# Patient Record
Sex: Male | Born: 1935
Health system: Southern US, Community
[De-identification: ages and names within clinical notes are randomized; demographics above are authoritative.]

## PROBLEM LIST (undated history)

## (undated) DIAGNOSIS — I10 Essential (primary) hypertension: Secondary | ICD-10-CM

## (undated) DIAGNOSIS — E785 Hyperlipidemia, unspecified: Secondary | ICD-10-CM

## (undated) DIAGNOSIS — G459 Transient cerebral ischemic attack, unspecified: Secondary | ICD-10-CM

## (undated) DIAGNOSIS — N400 Enlarged prostate without lower urinary tract symptoms: Secondary | ICD-10-CM

## (undated) DIAGNOSIS — J986 Disorders of diaphragm: Secondary | ICD-10-CM

## (undated) DIAGNOSIS — G4730 Sleep apnea, unspecified: Secondary | ICD-10-CM

## (undated) DIAGNOSIS — I4891 Unspecified atrial fibrillation: Secondary | ICD-10-CM

## (undated) DIAGNOSIS — K5909 Other constipation: Secondary | ICD-10-CM

## (undated) DIAGNOSIS — K635 Polyp of colon: Secondary | ICD-10-CM

## (undated) DIAGNOSIS — Z9989 Dependence on other enabling machines and devices: Secondary | ICD-10-CM

## (undated) DIAGNOSIS — M1990 Unspecified osteoarthritis, unspecified site: Secondary | ICD-10-CM

## (undated) DIAGNOSIS — K5792 Diverticulitis of intestine, part unspecified, without perforation or abscess without bleeding: Secondary | ICD-10-CM

## (undated) DIAGNOSIS — G4733 Obstructive sleep apnea (adult) (pediatric): Secondary | ICD-10-CM

## (undated) DIAGNOSIS — R0602 Shortness of breath: Secondary | ICD-10-CM

## (undated) DIAGNOSIS — R011 Cardiac murmur, unspecified: Secondary | ICD-10-CM

## (undated) DIAGNOSIS — E7800 Pure hypercholesterolemia, unspecified: Secondary | ICD-10-CM

## (undated) DIAGNOSIS — K219 Gastro-esophageal reflux disease without esophagitis: Secondary | ICD-10-CM

## (undated) HISTORY — DX: Shortness of breath: R06.02

## (undated) HISTORY — DX: Sleep apnea, unspecified: G47.30

## (undated) HISTORY — DX: Obstructive sleep apnea (adult) (pediatric): G47.33

## (undated) HISTORY — DX: Gastro-esophageal reflux disease without esophagitis: K21.9

## (undated) HISTORY — PX: HERNIA REPAIR: SHX51

## (undated) HISTORY — DX: Hyperlipidemia, unspecified: E78.5

## (undated) HISTORY — DX: Pure hypercholesterolemia, unspecified: E78.00

## (undated) HISTORY — DX: Polyp of colon: K63.5

## (undated) HISTORY — PX: ROTATOR CUFF REPAIR: SHX139

## (undated) HISTORY — PX: KNEE SURGERY: SHX244

## (undated) HISTORY — DX: Diverticulitis of intestine, part unspecified, without perforation or abscess without bleeding: K57.92

## (undated) HISTORY — PX: CHOLECYSTECTOMY: SHX55

## (undated) HISTORY — PX: AV FISTULA REPAIR: SHX563

## (undated) HISTORY — DX: Essential (primary) hypertension: I10

## (undated) HISTORY — DX: Cardiac murmur, unspecified: R01.1

## (undated) HISTORY — DX: Dependence on other enabling machines and devices: Z99.89

## (undated) HISTORY — DX: Unspecified atrial fibrillation: I48.91

## (undated) HISTORY — DX: Unspecified osteoarthritis, unspecified site: M19.90

## (undated) HISTORY — DX: Disorders of diaphragm: J98.6

## (undated) HISTORY — DX: Other constipation: K59.09

## (undated) HISTORY — PX: HEMORRHOID SURGERY: SHX153

## (undated) HISTORY — DX: Transient cerebral ischemic attack, unspecified: G45.9

## (undated) HISTORY — DX: Benign prostatic hyperplasia without lower urinary tract symptoms: N40.0

---

## 1998-06-12 ENCOUNTER — Other Ambulatory Visit: Admission: RE | Admit: 1998-06-12 | Discharge: 1998-06-12 | Payer: Self-pay | Admitting: Orthopedic Surgery

## 2002-04-13 ENCOUNTER — Ambulatory Visit (HOSPITAL_COMMUNITY): Admission: RE | Admit: 2002-04-13 | Discharge: 2002-04-13 | Payer: Self-pay | Admitting: Family Medicine

## 2002-04-13 ENCOUNTER — Encounter: Payer: Self-pay | Admitting: Family Medicine

## 2003-04-18 ENCOUNTER — Encounter: Payer: Self-pay | Admitting: Emergency Medicine

## 2003-04-18 ENCOUNTER — Emergency Department (HOSPITAL_COMMUNITY): Admission: EM | Admit: 2003-04-18 | Discharge: 2003-04-19 | Payer: Self-pay | Admitting: Emergency Medicine

## 2003-05-04 ENCOUNTER — Ambulatory Visit (HOSPITAL_COMMUNITY): Admission: RE | Admit: 2003-05-04 | Discharge: 2003-05-04 | Payer: Self-pay | Admitting: Specialist

## 2003-05-04 ENCOUNTER — Encounter: Payer: Self-pay | Admitting: Specialist

## 2003-11-21 ENCOUNTER — Ambulatory Visit (HOSPITAL_COMMUNITY): Admission: RE | Admit: 2003-11-21 | Discharge: 2003-11-21 | Payer: Self-pay | Admitting: Orthopedic Surgery

## 2003-12-02 ENCOUNTER — Encounter: Admission: RE | Admit: 2003-12-02 | Discharge: 2004-01-02 | Payer: Self-pay | Admitting: Orthopedic Surgery

## 2004-07-13 ENCOUNTER — Ambulatory Visit (HOSPITAL_COMMUNITY): Admission: RE | Admit: 2004-07-13 | Discharge: 2004-07-13 | Payer: Self-pay | Admitting: Family Medicine

## 2004-10-19 ENCOUNTER — Ambulatory Visit (HOSPITAL_COMMUNITY): Admission: RE | Admit: 2004-10-19 | Discharge: 2004-10-19 | Payer: Self-pay | Admitting: Orthopedic Surgery

## 2004-12-04 ENCOUNTER — Ambulatory Visit (HOSPITAL_COMMUNITY): Admission: RE | Admit: 2004-12-04 | Discharge: 2004-12-04 | Payer: Self-pay | Admitting: Orthopedic Surgery

## 2004-12-04 ENCOUNTER — Ambulatory Visit (HOSPITAL_BASED_OUTPATIENT_CLINIC_OR_DEPARTMENT_OTHER): Admission: RE | Admit: 2004-12-04 | Discharge: 2004-12-04 | Payer: Self-pay | Admitting: Orthopedic Surgery

## 2005-03-05 ENCOUNTER — Encounter: Admission: RE | Admit: 2005-03-05 | Discharge: 2005-06-03 | Payer: Self-pay | Admitting: Orthopedic Surgery

## 2005-06-02 ENCOUNTER — Emergency Department (HOSPITAL_COMMUNITY): Admission: EM | Admit: 2005-06-02 | Discharge: 2005-06-02 | Payer: Self-pay | Admitting: Family Medicine

## 2005-09-07 ENCOUNTER — Emergency Department (HOSPITAL_COMMUNITY): Admission: EM | Admit: 2005-09-07 | Discharge: 2005-09-07 | Payer: Self-pay | Admitting: *Deleted

## 2005-11-04 ENCOUNTER — Ambulatory Visit: Payer: Self-pay | Admitting: Internal Medicine

## 2005-11-13 ENCOUNTER — Ambulatory Visit: Payer: Self-pay | Admitting: Internal Medicine

## 2005-12-04 ENCOUNTER — Ambulatory Visit: Payer: Self-pay | Admitting: Internal Medicine

## 2005-12-04 ENCOUNTER — Encounter: Payer: Self-pay | Admitting: Internal Medicine

## 2005-12-04 ENCOUNTER — Ambulatory Visit (HOSPITAL_COMMUNITY): Admission: RE | Admit: 2005-12-04 | Discharge: 2005-12-04 | Payer: Self-pay | Admitting: Internal Medicine

## 2005-12-16 DIAGNOSIS — K635 Polyp of colon: Secondary | ICD-10-CM

## 2005-12-16 HISTORY — DX: Polyp of colon: K63.5

## 2006-01-08 ENCOUNTER — Emergency Department (HOSPITAL_COMMUNITY): Admission: EM | Admit: 2006-01-08 | Discharge: 2006-01-09 | Payer: Self-pay | Admitting: Family Medicine

## 2006-01-09 ENCOUNTER — Ambulatory Visit: Payer: Self-pay | Admitting: Internal Medicine

## 2006-01-27 ENCOUNTER — Ambulatory Visit: Payer: Self-pay | Admitting: Internal Medicine

## 2006-03-06 ENCOUNTER — Ambulatory Visit: Payer: Self-pay | Admitting: Internal Medicine

## 2006-03-28 ENCOUNTER — Ambulatory Visit (HOSPITAL_COMMUNITY): Admission: RE | Admit: 2006-03-28 | Discharge: 2006-03-28 | Payer: Self-pay | Admitting: *Deleted

## 2006-06-13 ENCOUNTER — Encounter: Admission: RE | Admit: 2006-06-13 | Discharge: 2006-06-13 | Payer: Self-pay | Admitting: Neurosurgery

## 2006-06-30 ENCOUNTER — Encounter: Admission: RE | Admit: 2006-06-30 | Discharge: 2006-06-30 | Payer: Self-pay | Admitting: Neurosurgery

## 2007-01-06 ENCOUNTER — Ambulatory Visit (HOSPITAL_COMMUNITY): Admission: RE | Admit: 2007-01-06 | Discharge: 2007-01-06 | Payer: Self-pay | Admitting: Ophthalmology

## 2007-01-22 ENCOUNTER — Ambulatory Visit (HOSPITAL_COMMUNITY): Admission: RE | Admit: 2007-01-22 | Discharge: 2007-01-22 | Payer: Self-pay | Admitting: Ophthalmology

## 2007-02-04 ENCOUNTER — Ambulatory Visit (HOSPITAL_COMMUNITY): Admission: RE | Admit: 2007-02-04 | Discharge: 2007-02-04 | Payer: Self-pay | Admitting: *Deleted

## 2007-02-06 HISTORY — PX: CARDIAC CATHETERIZATION: SHX172

## 2007-03-18 ENCOUNTER — Ambulatory Visit: Payer: Self-pay | Admitting: Internal Medicine

## 2007-07-17 ENCOUNTER — Ambulatory Visit (HOSPITAL_COMMUNITY): Admission: RE | Admit: 2007-07-17 | Discharge: 2007-07-17 | Payer: Self-pay | Admitting: Family Medicine

## 2007-11-09 DIAGNOSIS — G4733 Obstructive sleep apnea (adult) (pediatric): Secondary | ICD-10-CM

## 2007-11-09 HISTORY — DX: Obstructive sleep apnea (adult) (pediatric): G47.33

## 2008-07-02 ENCOUNTER — Encounter: Admission: RE | Admit: 2008-07-02 | Discharge: 2008-07-02 | Payer: Self-pay | Admitting: Neurosurgery

## 2008-11-01 ENCOUNTER — Ambulatory Visit: Payer: Self-pay | Admitting: Internal Medicine

## 2009-10-20 ENCOUNTER — Ambulatory Visit (HOSPITAL_COMMUNITY): Admission: RE | Admit: 2009-10-20 | Discharge: 2009-10-20 | Payer: Self-pay | Admitting: Family Medicine

## 2010-03-13 ENCOUNTER — Ambulatory Visit: Payer: Self-pay | Admitting: Internal Medicine

## 2010-03-13 DIAGNOSIS — R1319 Other dysphagia: Secondary | ICD-10-CM | POA: Insufficient documentation

## 2010-03-13 DIAGNOSIS — Z860100 Personal history of colon polyps, unspecified: Secondary | ICD-10-CM | POA: Insufficient documentation

## 2010-03-14 ENCOUNTER — Ambulatory Visit (HOSPITAL_COMMUNITY): Admission: RE | Admit: 2010-03-14 | Discharge: 2010-03-14 | Payer: Self-pay | Admitting: Internal Medicine

## 2010-03-16 ENCOUNTER — Encounter: Payer: Self-pay | Admitting: Internal Medicine

## 2010-03-21 ENCOUNTER — Encounter (INDEPENDENT_AMBULATORY_CARE_PROVIDER_SITE_OTHER): Payer: Self-pay

## 2010-04-17 ENCOUNTER — Ambulatory Visit (HOSPITAL_COMMUNITY): Admission: RE | Admit: 2010-04-17 | Discharge: 2010-04-17 | Payer: Self-pay | Admitting: Internal Medicine

## 2010-05-18 ENCOUNTER — Emergency Department (HOSPITAL_COMMUNITY): Admission: EM | Admit: 2010-05-18 | Discharge: 2010-05-18 | Payer: Self-pay | Admitting: Emergency Medicine

## 2010-05-22 ENCOUNTER — Inpatient Hospital Stay (HOSPITAL_COMMUNITY)
Admission: EM | Admit: 2010-05-22 | Discharge: 2010-05-23 | Payer: Self-pay | Source: Home / Self Care | Admitting: Emergency Medicine

## 2010-05-30 DIAGNOSIS — R0602 Shortness of breath: Secondary | ICD-10-CM

## 2010-05-30 HISTORY — DX: Shortness of breath: R06.02

## 2010-06-06 DIAGNOSIS — I4891 Unspecified atrial fibrillation: Secondary | ICD-10-CM

## 2010-06-06 HISTORY — DX: Unspecified atrial fibrillation: I48.91

## 2010-11-26 ENCOUNTER — Encounter (INDEPENDENT_AMBULATORY_CARE_PROVIDER_SITE_OTHER): Payer: Self-pay | Admitting: *Deleted

## 2010-12-18 ENCOUNTER — Ambulatory Visit
Admission: RE | Admit: 2010-12-18 | Discharge: 2010-12-18 | Payer: Self-pay | Source: Home / Self Care | Attending: Gastroenterology | Admitting: Gastroenterology

## 2010-12-18 ENCOUNTER — Encounter: Payer: Self-pay | Admitting: Internal Medicine

## 2010-12-26 ENCOUNTER — Telehealth (INDEPENDENT_AMBULATORY_CARE_PROVIDER_SITE_OTHER): Payer: Self-pay

## 2010-12-27 ENCOUNTER — Encounter: Payer: Self-pay | Admitting: Internal Medicine

## 2011-01-04 ENCOUNTER — Ambulatory Visit (HOSPITAL_COMMUNITY)
Admission: RE | Admit: 2011-01-04 | Discharge: 2011-01-04 | Payer: Self-pay | Source: Home / Self Care | Attending: Internal Medicine | Admitting: Internal Medicine

## 2011-01-06 ENCOUNTER — Encounter: Payer: Self-pay | Admitting: Family Medicine

## 2011-01-06 ENCOUNTER — Encounter: Payer: Self-pay | Admitting: Neurosurgery

## 2011-01-06 ENCOUNTER — Encounter: Payer: Self-pay | Admitting: Internal Medicine

## 2011-01-11 ENCOUNTER — Encounter: Payer: Self-pay | Admitting: Internal Medicine

## 2011-01-15 NOTE — Op Note (Signed)
  NAME:  Joe Lee, Joe Lee              ACCOUNT NO.:  [id number]  MEDICAL RECORD NO.:  [id number]          PATIENT TYPE:  AMB  LOCATION:  DAY                           FACILITY:  [facility]  PHYSICIAN:  R. Roetta Sessions, M.D. DATE OF BIRTH:  July 25, 1934  DATE OF PROCEDURE:  01/04/2011 DATE OF DISCHARGE:                              OPERATIVE REPORT   PROCEDURE:  Colonoscopy with snare polypectomy biopsy.  INDICATIONS FOR PROCEDURE:  A 77 year old gentleman with history colonic adenomas, last colonoscopy approximately 5-6 years ago.  No lower GI tract symptoms currently here for surveillance.  Risks, benefits, limitations, alternatives, and imponderables have been reviewed, questions answered.  Please see the documentation medical record.  PROCEDURE NOTE:  O2 saturation, blood pressure, pulse, and respirations were monitored throughout the entire procedure.  CONSCIOUS SEDATION:  Versed 4 mg IV and Demerol 75 mg IV in divided doses.  INSTRUMENT:  Pentax video chip system.  FINDINGS:  Digital rectal exam revealed no abnormalities.  Endoscopic findings:  Prep was adequate.  Colon:  Colonic the rectosigmoid junction through the left transverse right colon to the appendiceal orifice, ileocecal valve/cecum.  These structures well seen and photographed for the record.  From this level, scope was slowly and cautiously withdrawn. All previously mentioned mucosal surfaces were again seen.  The patient was noted to have 2 ascending colon polyps.  There were both hot snared, largest was 8 mm.  There was also diminutive sigmoid polyp which was cold biopsied/removed.  The patient also had pancolonic diverticula. Remaining colonic mucosa appeared unremarkable.  Scope was pulled down into the rectum.  Thorough examination of rectal mucosa including retroflex view of the anal verge demonstrated no abnormalities.  Cecal withdrawal time 12 minutes.  The patient tolerated the procedure well and was reactive  in endoscopy.  IMPRESSION: 1. Normal rectum. 2. Pancolonic diverticula. 3. Multiple colonic polyps removed as described above.  RECOMMENDATIONS: 1. Resume Coumadin tomorrow, do not resume aspirin until January 12, 2011. 2. Follow up on path. 3. Further recommendations to follow.     Jonathon Bellows, M.D.     RMR/MEDQ  D:  01/04/2011  T:  01/04/2011  Job:  [id number]  cc:   [facility]  Dr. Egbert Garibaldi  Electronically Signed by Lorrin Goodell M.D. on 01/15/2011 01:39:06 PM

## 2011-01-15 NOTE — Assessment & Plan Note (Signed)
Summary: FOOD GETS STUCK/CM   Visit Type:  Follow-up Visit Primary Care Provider:  Dr. Nobie Putnam  Chief Complaint:  food feels like its getting stuck.  History of Present Illness: "When I eat solid foods, feels like gets stuck in back of throat" x 1 1/2 months. Mostly solids like peanut butter, peanuts, greasy foods. No problems w/ liquids.  No regurgitation.  Denies heartburn & indigestion.  On coumadin for A Fib.  Denies N/V/D or constipation.  Takes miralax prn.  Denies rectal bleeding or melena.  Denies any abd pain.  Wt stable.  Appetite ok.  Current Problems (verified): 1)  Colonic Polyps, Adenomatous, Hx of  (ICD-V12.72) 2)  Other Dysphagia  (ICD-787.29)  Current Medications (verified): 1)  Multi-Vitamin 2)  Metoprolol Tartrate 50 Mg Tabs (Metoprolol Tartrate) .... One By Mouth Bid 3)  Garlic 4)  Warfarin Sodium 5 Mg Tabs (Warfarin Sodium) .... One By Mouth Daily 5)  Simvastatin 20 Mg Tabs (Simvastatin) .... One By Mouth Daily 6)  Gas X .... As Needed 7)  Fish Oil 1000 Mg Caps (Omega-3 Fatty Acids) .... Once Daily 8)  Diltiazem Hcl Cr 180 Mg Xr24h-Cap (Diltiazem Hcl) .... Once Daily 9)  Miralax  Powd (Polyethylene Glycol 3350) .Marland KitchenMarland KitchenMarland Kitchen 17 Grams By Mouth Daily  Allergies (verified): 1)  ! Penicillin  Past History:  Past Medical History: A Fib GERD Hyperlipidemia Hypertension Hx of Diverticulosis w/ diverticulitis  constipation adenomatous colon polyp 12/16/05 colonoscopy  Past Surgical History: Cholecystectomy Hemorrhoidectomy Rotator Cuff Repair Knee Sx  anorectal fistula repair  Family History: No known family history of colorectal carcinoma, IBD, liver or chronic GI problems. 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 quit 35 yrs Alcohol Use - 1/2 glass red wine daily, occ beer Illicit Drug Use  - no Patient gets regular exercise. Smoking Status:  quit Drug Use:  no Does Patient Exercise:  yes  Review of Systems General:  Denies fever, chills, sweats, anorexia, fatigue, weakness, malaise, weight loss, and sleep disorder. CV:  Denies chest pains, angina, palpitations, syncope, dyspnea on exertion, orthopnea, PND, peripheral edema, and claudication. Resp:  Denies dyspnea at rest, dyspnea with exercise, cough, sputum, wheezing, coughing up blood, and pleurisy. GI:  Denies pain on swallowing, nausea, indigestion/heartburn, vomiting, vomiting blood, abdominal pain, jaundice, gas/bloating, diarrhea, constipation, change in bowel habits, bloody BM's, black BMs, and fecal incontinence. GU:  Denies urinary burning, blood in urine, urinary frequency, urinary hesitancy, nocturnal urination, and urinary incontinence. Derm:  Denies rash, itching, dry skin, hives, moles, warts, and unhealing ulcers. Psych:  Denies depression, anxiety, memory loss, suicidal ideation, hallucinations, paranoia, phobia, and confusion. Heme:  Denies bruising, bleeding, and enlarged lymph nodes.  Vital Signs:  Patient profile:   77 year old male Height:      70.5 inches Weight:      225 pounds BMI:     31.94 Temp:     98.3 degrees F oral Pulse rate:   64 / minute BP sitting:   122 / 74  (left arm) Cuff size:   regular  Vitals Entered By: Hendricks Limes LPN (March 13, 2010 11:17 AM)  Physical Exam  General:  Well developed, well nourished, no acute distress. Head:  Normocephalic and atraumatic. Eyes:  Sclera clear, no icterus. Ears:  Normal auditory acuity. Mouth:  No deformity or lesions, dentition normal. Neck:  Supple;  no masses or thyromegaly. Lungs:  Clear throughout to auscultation. Heart:  Regular rate and rhythm; no murmurs, rubs,  or bruits. Abdomen:  Soft, nontender and nondistended. No masses, hepatosplenomegaly or hernias noted. Normal bowel sounds.without guarding and without rebound.   Msk:   Symmetrical with no gross deformities. Normal posture. Pulses:  Normal pulses noted. Extremities:  No clubbing, cyanosis, edema or deformities noted. Neurologic:  Alert and  oriented x4;  grossly normal neurologically. Skin:  Intact without significant lesions or rashes. Cervical Nodes:  No significant cervical adenopathy. Psych:  Alert and cooperative. Normal mood and affect.  Impression & Recommendations:  Problem # 1:  OTHER DYSPHAGIA (OAC-166.06) 77 y/o black male w/ 6 week hx of dysphagia.  May be oropharyngeal component as he points more to pharynx/neck than to upper esophagus.  Ba pill esophagram for further evaluation.  If esophageal web, ring, stricture is found, would pursue EGD for dilatation.  Orders: Est. Patient Level IV (30160)  Problem # 2:  COLONIC POLYPS, ADENOMATOUS, HX OF (ICD-V12.72) Assessment: Comment Only

## 2011-01-15 NOTE — Letter (Signed)
Summary: Plan of Care, Need to Discuss  [facility]  [address]   [place], [place] [address]   Phone: [phone]  Fax: [phone]    March 21, 2010  NHIA HEAPHY [address] [place], [place]  [address] 1934/01/18   Dear Joe Lee,   We are writing this letter to inform you of treatment plans and/or discuss your plan of care.  We have tried several times to contact you; however, we have yet to reach you.  We ask that you please contact our office for follow-up on your gastrointestinal issues.  We can  be reached at [phone] to schedule an appointment, or to speak with someone regarding your health care needs.  Please do not neglect your health.   Sincerely,    Cloria Spring LPN  [facility] Ph: [phone]    Fax: [phone]

## 2011-01-15 NOTE — Letter (Signed)
Summary: speech rehab order  speech rehab order   Imported By: Ave Filter 03/16/2010 11:22:21  _____________________________________________________________________  External Attachment:    Type:   Image     Comment:   External Document

## 2011-01-15 NOTE — Letter (Signed)
Summary: BPE ORDER  BPE ORDER   Imported By: Ave Filter 03/13/2010 12:27:21  _____________________________________________________________________  External Attachment:    Type:   Image     Comment:   External Document

## 2011-01-17 NOTE — Progress Notes (Signed)
----   Converted from flag ---- ---- 12/25/2010 4:25 PM, Gerrit Halls NP wrote: can we d/w Mr. Barua's cardiology his coumadin. need to know if we can hold 5 days prior. hx of afib. scheduled 1/20. thanks! ------------------------------  Appended Document:  Faxed request to [facility].  Appended Document:  Received fax back from [place]. Signed by Dr. Lynnea Ferrier, OK to hold coumadin x 5 days prior to procedure. Pt informed and told to take last coumadin on 12/29/2010. TCS scheduled for 01/04/2011.

## 2011-01-17 NOTE — Letter (Signed)
Summary: CARDIOLOGIST/RE: COUMADIN  CARDIOLOGIST/RE: COUMADIN   Imported By: Rexene Alberts 12/27/2010 11:35:05  _____________________________________________________________________  External Attachment:    Type:   Image     Comment:   External Document

## 2011-01-17 NOTE — Assessment & Plan Note (Signed)
Summary: consult for tcs/ov prior to scheduling/ss   Visit Type:  Follow-up Visit Primary Care Provider:  None  CC:  consult for tcs.  History of Present Illness: Mr. Chiang presents today for a standard office visit prior to upcoming colonoscopy. Last seen March 2011. Denies abdominal pain. Hx of constipation, relieved by miralax. No brbpr, no melena. No loss of appetite or weight loss. Denies reflux currently. Denies dysphagia/odynophagia. On Coumadin 5 mg secondary to afib. Did see Dr. Nobie Putnam as PCP who has since retired per pt.   Dec 2006: Anal stenosis, anal canal hemorrhoids, otherwise normal rectum.  2.  Pancolonic diverticula.  3.  A diminutive polyp at hepatic flexure cold biopsied/removed.  The      remainder of the colonic mucosa appeared normal. biopsy: adenoma   Current Medications (verified): 1)  Multi-Vitamin 2)  Metoprolol Tartrate 50 Mg Tabs (Metoprolol Tartrate) .... One By Mouth Bid 3)  Garlic 4)  Warfarin Sodium 5 Mg Tabs (Warfarin Sodium) .... One By Mouth Daily 5)  Simvastatin 20 Mg Tabs (Simvastatin) .... One By Mouth Daily 6)  Gas X .... As Needed 7)  Fish Oil 1000 Mg Caps (Omega-3 Fatty Acids) .... Once Daily 8)  Diltiazem Hcl Cr 180 Mg Xr24h-Cap (Diltiazem Hcl) .... Once Daily 9)  Miralax  Powd (Polyethylene Glycol 3350) .Marland KitchenMarland KitchenMarland Kitchen 17 Grams By Mouth Daily 10)  Aspirin 81 Mg Tbec (Aspirin) .... Once Daily  Allergies (verified): 1)  ! Penicillin  Past History:  Past Medical History: Last updated: 03/13/2010 A Fib GERD Hyperlipidemia Hypertension Hx of Diverticulosis w/ diverticulitis  constipation adenomatous colon polyp 12/16/05 colonoscopy  Past Surgical History: Last updated: 03/13/2010 Cholecystectomy Hemorrhoidectomy Rotator Cuff Repair Knee Sx  anorectal fistula repair  Family History: Last updated: 03/13/2010 No known family history of colorectal carcinoma, IBD, liver or chronic GI problems. 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 quit 35 yrs Alcohol Use - 1/2 glass red wine daily, occ beer Illicit Drug Use - no Patient gets regular exercise.  Review of Systems General:  Denies fever, chills, and anorexia. Eyes:  Denies blurring, irritation, and discharge. ENT:  Denies sore throat, hoarseness, and difficulty swallowing. CV:  Denies chest pains, syncope, and dyspnea on exertion. Resp:  Denies dyspnea at rest and wheezing. GI:  Denies difficulty swallowing, pain on swallowing, nausea, indigestion/heartburn, abdominal pain, constipation, change in bowel habits, bloody BM's, and black BMs. GU:  Denies urinary burning and urinary frequency. MS:  Denies joint pain / LOM, joint swelling, and joint stiffness. Derm:  Denies rash, itching, and dry skin. Neuro:  Denies weakness and syncope. Psych:  Denies depression and anxiety. Endo:  Denies cold intolerance and heat intolerance. Heme:  Denies bruising and bleeding.  Vital Signs:  Patient profile:   77 year old male Height:      70.5 inches Weight:      224 pounds BMI:     31.80 Temp:     98.3 degrees F oral Pulse rate:   64 / minute BP sitting:   128 / 100  (left arm) Cuff size:   regular  Vitals Entered By: Hendricks Limes LPN (December 18, 2010 10:48 AM)  Physical Exam  General:  Well developed, well nourished, no acute distress. Head:  Normocephalic and atraumatic. Eyes:  sclera without icterus, conjuctiva clear Mouth:  No deformity or lesions, dentition normal. Lungs:  Clear throughout to auscultation. Heart:  S1 S2 present, no murmurs appreciated Abdomen:  +BS, non-tender, non-distended, no HSM. no mass noted Msk:  Symmetrical with no gross deformities. Normal posture. Pulses:  Normal pulses noted. Extremities:  No clubbing, cyanosis, edema or deformities  noted. Neurologic:  Alert and  oriented x4;  grossly normal neurologically. Skin:  Intact without significant lesions or rashes. Psych:  Alert and cooperative. Normal mood and affect.  Impression & Recommendations:  Problem # 1:  COLONIC POLYPS, ADENOMATOUS, HX OF (ICD-V44.14)  77 year old male here for repeat colonoscopy, last done December 2006. Hx of adenoma. No abdominal pain, no change in bowel habits, denies brbpr or melena. On Coumadin for hx of afib, will need to discuss with cardiology for management prior to colonoscopy.  TCS with RMR: the risks, benefits, and alternatives have been discussed in detail with the pt; verbal consent obtained. Will d/w cardiology managment of coumadin  Orders: Est. Patient Level II ([address])

## 2011-01-17 NOTE — Letter (Signed)
Summary: TCS ORDER  TCS ORDER   Imported By: Ave Filter 12/18/2010 14:44:02  _____________________________________________________________________  External Attachment:    Type:   Image     Comment:   External Document

## 2011-01-17 NOTE — Letter (Addendum)
Summary: Patient Notice, Colon Biopsy Results  [facility]  [address]   [place], [place] [address]   Phone: [phone]  Fax: [phone]       January 11, 2011   Joe Lee [address] [place], [place]  [address] 08/13/1934    Dear Joe Lee,  I am pleased to inform you that the biopsies taken during your recent colonoscopy did not show any evidence of cancer upon pathologic examination.  Additional information/recommendations:  No further action is needed at this time.  Please follow-up with your primary care physician for your other healthcare needs.  You should have a repeat colonoscopy examination  in 5 years.  Please call us if you are having persistent problems or have questions about your condition that have not been fully answered at this time.  Sincerely,    R. Roetta Sessions MD, FACP [facility] Ph: [phone]    Fax: [phone]   Appended Document: Patient Notice, Colon Biopsy Results letter mailed to pt  Appended Document: Patient Notice, Colon Biopsy Results reminder in epic

## 2011-01-17 NOTE — Letter (Signed)
Summary: Recall Office Visit  [facility]  [address]   [place], [place] [address]   Phone: [phone]  Fax: [phone]      November 26, 2010   LINDY PENNISI [address] [place], [place]  [address] [birth year]   Dear Mr. Goldwater,   According to our records, it is time for you to schedule a follow-up office visit with [place].   At your convenience, please call [phone] to schedule an office visit. If you have any questions, concerns, or feel that this letter is in error, we would appreciate your call.   Sincerely,    Diana Eves  [facility] Ph: [phone]   Fax: [phone]

## 2011-03-04 LAB — DIFFERENTIAL
Basophils Absolute: 0 10*3/uL (ref 0.0–0.1)
Basophils Relative: 1 % (ref 0–1)
Basophils Relative: 2 % — ABNORMAL HIGH (ref 0–1)
Eosinophils Absolute: 0.1 10*3/uL (ref 0.0–0.7)
Eosinophils Absolute: 0.2 10*3/uL (ref 0.0–0.7)
Eosinophils Relative: 4 % (ref 0–5)
Lymphocytes Relative: 44 % (ref 12–46)
Lymphocytes Relative: 46 % (ref 12–46)
Lymphs Abs: 2.5 K/uL (ref 0.7–4.0)
Monocytes Absolute: 0.4 10*3/uL (ref 0.1–1.0)
Monocytes Relative: 7 % (ref 3–12)
Monocytes Relative: 8 % (ref 3–12)
Neutro Abs: 2.2 10*3/uL (ref 1.7–7.7)
Neutrophils Relative %: 42 % — ABNORMAL LOW (ref 43–77)
Neutrophils Relative %: 45 % (ref 43–77)

## 2011-03-04 LAB — CARDIAC PANEL(CRET KIN+CKTOT+MB+TROPI): Relative Index: 1.6 (ref 0.0–2.5)

## 2011-03-04 LAB — POCT CARDIAC MARKERS
CKMB, poc: 3.7 ng/mL (ref 1.0–8.0)
Myoglobin, poc: 128 ng/mL (ref 12–200)
Myoglobin, poc: 148 ng/mL (ref 12–200)
Troponin i, poc: <0.05 ng/mL (ref 0.00–0.09)

## 2011-03-04 LAB — CBC
HCT: 39.1 % (ref 39.0–52.0)
HCT: 42.8 % (ref 39.0–52.0)
Hemoglobin: 13.3 g/dL (ref 13.0–17.0)
Hemoglobin: 14.2 g/dL (ref 13.0–17.0)
MCHC: 33.2 g/dL (ref 30.0–36.0)
MCHC: 34.1 g/dL (ref 30.0–36.0)
MCV: 90.8 fL (ref 78.0–100.0)
Platelets: 152 10*3/uL (ref 150–400)
Platelets: 175 10*3/uL (ref 150–400)
RBC: 4.31 MIL/uL (ref 4.22–5.81)
RDW: 15 % (ref 11.5–15.5)
RDW: 15.3 % (ref 11.5–15.5)
WBC: 5.4 10*3/uL (ref 4.0–10.5)
WBC: 6.6 10*3/uL (ref 4.0–10.5)

## 2011-03-04 LAB — URINALYSIS, ROUTINE W REFLEX MICROSCOPIC
Nitrite: NEGATIVE
Protein, ur: NEGATIVE mg/dL
Urobilinogen, UA: 1 mg/dL (ref 0.0–1.0)

## 2011-03-04 LAB — LIPID PANEL
Cholesterol: 131 mg/dL (ref 0–200)
LDL Cholesterol: 63 mg/dL (ref 0–99)
Total CHOL/HDL Ratio: 2.6 RATIO
Triglycerides: 83 mg/dL (ref <150)
VLDL: 17 mg/dL (ref 0–40)

## 2011-03-04 LAB — PROTIME-INR
INR: 1.25 (ref 0.00–1.49)
INR: 2.77 — ABNORMAL HIGH (ref 0.00–1.49)
Prothrombin Time: 29 seconds — ABNORMAL HIGH (ref 11.6–15.2)

## 2011-03-04 LAB — POCT I-STAT, CHEM 8
BUN: 22 mg/dL (ref 6–23)
Calcium, Ion: 1.19 mmol/L (ref 1.12–1.32)
Chloride: 108 mEq/L (ref 96–112)
Creatinine, Ser: 1.2 mg/dL (ref 0.4–1.5)
Glucose, Bld: 116 mg/dL — ABNORMAL HIGH (ref 70–99)
HCT: 41 % (ref 39.0–52.0)
Hemoglobin: 13.9 g/dL (ref 13.0–17.0)
Potassium: 3.8 meq/L (ref 3.5–5.1)
Sodium: 142 mEq/L (ref 135–145)
TCO2: 27 mmol/L (ref 0–100)

## 2011-03-04 LAB — TROPONIN I
Troponin I: 0.01 ng/mL (ref 0.00–0.06)
Troponin I: 0.02 ng/mL (ref 0.00–0.06)

## 2011-03-04 LAB — RAPID URINE DRUG SCREEN, HOSP PERFORMED: Barbiturates: NOT DETECTED

## 2011-03-04 LAB — BASIC METABOLIC PANEL
Chloride: 107 mEq/L (ref 96–112)
Creatinine, Ser: 1.06 mg/dL (ref 0.4–1.5)
Potassium: 4.2 mEq/L (ref 3.5–5.1)

## 2011-03-04 LAB — CK TOTAL AND CKMB (NOT AT ARMC)
CK, MB: 3.9 ng/mL (ref 0.3–4.0)
Relative Index: 1.6 (ref 0.0–2.5)
Total CK: 264 U/L — ABNORMAL HIGH (ref 7–232)

## 2011-03-04 LAB — APTT: aPTT: 34 seconds (ref 24–37)

## 2011-03-04 LAB — HEMOGLOBIN A1C: Mean Plasma Glucose: 134 mg/dL — ABNORMAL HIGH (ref <117)

## 2011-04-13 ENCOUNTER — Inpatient Hospital Stay (INDEPENDENT_AMBULATORY_CARE_PROVIDER_SITE_OTHER)
Admission: RE | Admit: 2011-04-13 | Discharge: 2011-04-13 | Disposition: A | Discharge status: Home / Self Care | Payer: 59 | Source: Ambulatory Visit | Attending: Emergency Medicine | Admitting: Emergency Medicine

## 2011-04-13 DIAGNOSIS — T6391XA Toxic effect of contact with unspecified venomous animal, accidental (unintentional), initial encounter: Secondary | ICD-10-CM

## 2011-04-30 NOTE — Assessment & Plan Note (Signed)
NAME:  Joe Lee, Joe Lee               CHART#:  [id number]   DATE:  11/01/2008                       DOB:  03-23-1934   FOLLOWUP:  History of gastroesophageal reflux disease and history of  colonic adenoma removed in 2006.   The patient is here for followup.  I tentatively plan to bring him back  in 3 years for a colonoscopy and a single 3-mm adenoma in his colon and  hepatic flexure removed in 2006.  He has done well.  He does not have  any lower GI-like symptoms aside from chronic constipation well managed  with GlycoLax. Gastroesophageal reflux disease, symptoms are well-  controlled with Zegerid, which he really only takes on a p.r.n. basis  these days.  Overall, he has any significant intercurrent health  problems and is doing very well in general.   CURRENT MEDICATIONS:  See updated list.   ALLERGIES:  Penicillin.   PHYSICAL EXAMINATION:  GENERAL:  Today, pleasant 77 year old gentleman  resting comfortably.  VITAL SIGNS:  Weight 224.5 that is down 5-1/2 pounds, height 5 feet 9  inches, temperature 98.2, BP 140/80, and pulse 64.  SKIN:  Warm and dry.  No scleral icterus.  CHEST:  Lungs are clear to auscultation.  CARDIAC:  Regular rate and rhythm without murmur, gallop, or rub.  ABDOMEN:  Nondistended, positive bowel sounds, soft, nontender without  appreciable mass or organomegaly.   ASSESSMENT:  1. Gastroesophageal reflux disease symptoms, well controlled on      Zegerid, which he uses more or less on a p.r.n. basis.  2. History of a single small colonic adenoma in the hepatic flexure in      2006.  At this point, the patient can really wait 5 years until      between colonoscopies, and we talked about this at some length with      current guidelines which are recommended.  He will come back in 2      years for a surveillance colonoscopy.  If he has any interim      problems, however, I will be glad to see him back at anytime.  3. Chronic constipation.  He will continue  GlycoLax.  We will fax a      prescription over to [facility].       Jonathon Bellows, M.D.  Electronically Signed     RMR/MEDQ  D:  11/01/2008  T:  11/01/2008  Job:  [id number]   cc:   Patrica Duel, M.D.

## 2011-05-03 NOTE — Op Note (Signed)
NAME:  KEION, NEELS              ACCOUNT NO.:  [id number]   MEDICAL RECORD NO.:  [id number]          PATIENT TYPE:  AMB   LOCATION:  DAY                           FACILITY:  [facility]   PHYSICIAN:  R. Roetta Sessions, M.D. DATE OF BIRTH:  09/26/1934   DATE OF PROCEDURE:  12/04/2005  DATE OF DISCHARGE:                                 OPERATIVE REPORT   PROCEDURE:  Colonoscopy.   ENDOSCOPIST:  Gerrit Friends. Rourk, M.D.   INDICATIONS FOR PROCEDURE:  The patient is a 77 year old Caucasian male who  has had a recent bout of diverticulitis now clinically resolved.  He was  Hemoccult positive.  He has a distant history of colonic adenomatous polyps.  EGD is now being done to further evaluate his Hemoccult positive stool.  There has been no melena or hematochezia.  He is basically asymptomatic from  the GI standpoint at this time.   PROCEDURE NOTE:  O2 saturation, blood pressure, pulse and respirations were  monitored throughout the entire procedure.   CONSCIOUS SEDATION:  Versed 3 mg IV, Demerol 75 mg IV in divided doses.  Cetacaine spray for topical oropharyngeal anesthesia.   INSTRUMENT:  Olympus video chip system.   FINDINGS:  A digital rectal exam revealed moderate anal stenosis otherwise  negative.   ENDOSCOPIC FINDINGS:  The prep was good.   RECTUM:  Examination of the rectal mucosa including the retroflex view of  the anal verge and on-face view of the anal canal demonstrates some anal  canal hemorrhoids; otherwise rectal mucosa appeared normal.   COLON:  The colonic mucosa was surveyed from the rectosigmoid junction  through the left transverse and right colon to the area of the appendiceal  orifice, ileocecal valve, and cecum.  These structures were well seen and  photographed for the record.   From this level the scope was slowly withdrawn.  All previously mentioned  mucosal surfaces were again seen. The patient had pancolonic diverticula and  a 3-mm polyp at the hepatic  flexure.  The remainder of the colonic mucosa  appeared normal.  The polyp at the hepatic flexure was cold  biopsied/removed.  The patient tolerated the procedure well and was reacted  in endoscopy.   IMPRESSION:  1.  Anal stenosis, anal canal hemorrhoids, otherwise normal rectum.  2.  Pancolonic diverticula.  3.  A diminutive polyp at hepatic flexure cold biopsied/removed.  The      remainder of the colonic mucosa appeared normal.   RECOMMENDATIONS:  1.  Diverticulosis literature provided to Mr. Maulden.  2.  Followup on path.  3.  Further recommendations to follow.      Jonathon Bellows, M.D.  Electronically Signed     RMR/MEDQ  D:  12/04/2005  T:  12/05/2005  Job:  [id number]

## 2011-05-03 NOTE — Op Note (Signed)
NAME:  Joe Lee, Joe Lee                        ACCOUNT NO.:  [id number]   MEDICAL RECORD NO.:  [id number]                   PATIENT TYPE:  AMB   LOCATION:  DAY                                  FACILITY:  [facility]   PHYSICIAN:  Almedia Balls. Ranell Patrick, M.D.              DATE OF BIRTH:  08/11/1934   DATE OF PROCEDURE:  11/21/2003  DATE OF DISCHARGE:                                 OPERATIVE REPORT   PREOPERATIVE DIAGNOSIS:  Left knee medial meniscus tear.   POSTOPERATIVE DIAGNOSIS:  Left knee medial meniscus tear and lateral  meniscal tear.   PROCEDURE PERFORMED:  Left knee arthroscopy with debridement of posterior  horn medial meniscus tear and anterior horn lateral meniscus tear.   ATTENDING SURGEON:  Almedia Balls. Ranell Patrick, M.D.   ASSISTANT:  Nance Pew, P.A.-S.   Laryngeal mask anesthesia was used.   ESTIMATED BLOOD LOSS:  Minimal.   FLUIDS REPLACED:  1200 mL crystalloid.   Instrument count was correct.  There were no complications.  Perioperative  antibiotics were given.   INDICATIONS:  The patient is a 77 year old male who presents complaining of  left medial knee pain.  The patient has had knee pain now for the last  several months after an on-the-job injury.  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  His MRI scan demonstrates meniscal  tear.  After discussing with the patient options for management to include  conservative management versus surgical treatment, the patient elected to  proceed with surgery.  Informed consent was obtained.   DESCRIPTION OF PROCEDURE:  After an adequate level of anesthesia achieved,  the patient was positioned supine on the operating table, a lateral post was  utilized, the left lower extremity prepped and draped.  Standard  arthroscopic portals were utilized, superolateral inflow, anterolateral  scope, and anteromedial working portals were all  created in similar fashion  with infiltration of the skin with 0.25% Marcaine with epinephrine  throughout the incision line, blade scalpel, introduction of cannula into  the joint using blunt obturators.  Diagnostic arthroscopy revealed some mild  chondromalacia within the patellofemoral joint, grade 2 to grade 3 changes  which were localized and central.  The medial and lateral gutters appeared  to be _________ with the knee flexed, and a medial plica was noted.  There  was noted to be a complex posterior horn medial meniscus tear debrided back  to stable meniscal tissues with the biting instruments and the full radius  resector.  ACL, PCL were normal.  The remainder of femoral condylar  cartilage and tibial condylar cartilage in the medial compartment were  normal.  The lateral compartment was inspected, and there was noted to be an  anterior horn lateral meniscus tear which was complex and was debrided back  to stable meniscal tissue.  The posterior horn  actually looked pretty good.  The lateral femoral condyle and tibial condyle cartilage appeared normal.  After complete inspection of the knee for any loose debris,  none was noted.  At this point the scope was concluded and the wounds  sutured using 4-0 Monocryl, Marcaine was placed in the knee, and a sterile  dressing applied.  The patient tolerated the surgery well and was taken to  the recovery room in stable condition.                                               Almedia Balls. Ranell Patrick, M.D.    SRN/MEDQ  D:  11/21/2003  T:  11/21/2003  Job:  [id number]

## 2011-05-03 NOTE — Op Note (Signed)
NAME:  Joe Lee, Joe Lee NO.:  [id number]   MEDICAL RECORD NO.:  [id number]          PATIENT TYPE:  OUT   LOCATION:  DFTL                         FACILITY:  [facility]   PHYSICIAN:  Almedia Balls. Ranell Patrick, M.D. DATE OF BIRTH:  01-18-1934   DATE OF PROCEDURE:  12/04/2004  DATE OF DISCHARGE:  12/04/2004                                 OPERATIVE REPORT   PREOPERATIVE DIAGNOSIS:  Left knee medial meniscal tear.   POSTOPERATIVE DIAGNOSIS:  Left knee medial meniscal tear and lateral  meniscal tear.   OPERATION PERFORMED:  Left knee arthroscopy and arthroscopic partial medial  meniscectomy and partial lateral meniscectomy.   SURGEON:  Almedia Balls. Ranell Patrick, M.D.   ASSISTANT:  None.   ANESTHESIA:  Local plus MAC.   ESTIMATED BLOOD LOSS:  Minimal.   FLUIDS REPLACED:  1200 mL crystalloid.   INSTRUMENT COUNT:  Correct.   COMPLICATIONS:  None.   ANTIBIOTICS:  Perioperative antibiotics given.   INDICATIONS FOR PROCEDURE:  The patient is a 77 year old [employer] employee who  presents complaining of left knee pain.  The patient has had prior partial  medial meniscectomy, presents after re-injuring his knee and complaining of  persistent pain in the medial side of his knee.  The patient has failed  conservative management as an MRI documenting another meniscus tear on the  medial side of the knee as well as some changes consistent with possible  lateral meniscal tear.  The patient presents now for surgical treatment  having failed conservative management consisting of injections and activity  modifications.  Informed consent was obtained.   DESCRIPTION OF PROCEDURE:  After an adequate level of anesthesia was  achieved, the patient was positioned supine on the operating table, left leg  was placed in an arthroscopic leg holder, right leg placed in a well leg  holder.  After sterile prep and drape of the left knee, the knee was  approached through the same portals that were used before  with infiltration  of  the skin with 0.25% Marcaine with epinephrine followed by incision with  an 11 blade scalpel, introduction of cannula in the joint using blunt  obturators.  Diagnostic arthroscopy revealed some mild grade 2 changes in  the patellofemoral joint otherwise normal cartilage.  Medial and lateral  gutters free of loose bodies, no medial plica was noted.  There was some  scar on the anterior aspect of the knee that was removed.  The medial  compartment was entered and there was noted to be a complex posterior horn  medial meniscal tear with both horizontal cleavage and radial components.  This was debrided back to stable meniscal tissue involving debridement of  the majority of the posterior horn of the medial meniscus.  The lateral  meniscus had a horizontal  cleavage tear as well on the midportion of the  lateral meniscus.  This was debrided back to stable meniscal tissue.  Again  partial medial and lateral meniscectomy was performed.  About 60 to 70% of  the posterior horn of the medial meniscus had to be removed.  There were  some grade 3  changed noted in the medial compartment as well in the hyaline  cartilage with minimal chondroplasty and removal of the cartilage flaps  needed.  ACL atrophic but intact.  PCL intact.  The lateral compartment  cartilage looked better than the medial side with some mild grade 2  fibrillation and softness but again fairly aggressive meniscectomies medial  and lateral sides.  After completion meniscectomies, the knee was thoroughly  irrigated for removal of debris and the wounds sutured using 4-0 Monocryl  subcuticular followed by Steri-Strips and sterile dressing.  The patient was  taken to the recovery room in stable condition having tolerated the  procedure well.       SRN/MEDQ  D:  12/18/2004  T:  12/18/2004  Job:  [id number]

## 2011-05-03 NOTE — Op Note (Signed)
NAME:  Joe Lee, Joe Lee              ACCOUNT NO.:  [id number]   MEDICAL RECORD NO.:  [id number]          PATIENT TYPE:  AMB   LOCATION:  DFTL                         FACILITY:  [facility]   PHYSICIAN:  Almedia Balls. Ranell Patrick, M.D. DATE OF BIRTH:  1934/10/29   DATE OF PROCEDURE:  12/04/2004  DATE OF DISCHARGE:  12/04/2004                                 OPERATIVE REPORT   PREOPERATIVE DIAGNOSIS:  Left knee medial meniscal tear.   POSTOPERATIVE DIAGNOSIS:  Left knee medial meniscal tear and lateral  meniscal tear.   OPERATION PERFORMED:  Left knee arthroscopy with partial medial meniscectomy  and partial lateral meniscectomy.   SURGEON:  Almedia Balls. Ranell Patrick, M.D.   ASSISTANT:  None.   ANESTHESIA:  Local plus MAC.   ESTIMATED BLOOD LOSS:  Minimal.   FLUIDS REPLACED:  1200 mL crystalloid.   INSTRUMENT COUNT:  Correct.   COMPLICATIONS:  None.   ANTIBIOTICS:  Perioperative antibiotics were given.   INDICATIONS FOR PROCEDURE:  The patient is a 77 year old male who presents  complaining of left knee pain. He has had a prior left  knee meniscectomy  for meniscus tear and has had recurrence of pain in his left knee.  The  patient has focal joint line tenderness and an MRI suggesting recurrent tear  in his knee.  He presents now for operative treatment having failed  conservative management consisting of activity modifications, anti-  inflammatories and injections. Informed consent was obtained.   DESCRIPTION OF PROCEDURE:  After an adequate level of anesthesia was  achieved, the patient was positioned supine on the operating table.  The  left leg was placed in an arthroscopic leg holder.  The right leg was placed  in the well leg holder.  After sterile prep and drape of the left knee, the  left knee was arthroscoped using standard arthroscopic portals,  superolateral outflow, anterolateral scope and anteromedial working portals  were all created in similar fashion with infiltration of skin and  0.25%  Marcaine with epinephrine followed by incision with 11 blade scalpel and  introduction of cannula into the joint using blunt obturators.  Diagnostic  fluoroscopy revealed some mild patellofemoral chondromalacia.  This was  minimal.  There were no loose bodies noted.  Medial and lateral gutters were  clear.  There was no medial plica noted.  Medial compartment was entered.  There was noted to be a complex posterior  horn medial meniscal tear.  This  was trimmed back to stable meniscal tissue involving removal of about 30 to  40% of the posterior horn of the medial meniscus.  The remainder of the  meniscus was probed and felt to be stable.  There was some mild  chondromalacia present in the medial compartment, grade 2 to grade 3.  ACL  and PCL intact.  Lateral compartment there was noted to be a lateral  meniscal tear as well which was debrided back to stable meniscal tissue  involving removal of 20% of the lateral meniscus.  After completion of the  meniscectomy, both medial and lateral compartments and inspection of the  lateral compartment, no arthritis  was evident in the lateral compartment.  The knee was inspected for any loose debris and none was noted. The wounds  were sutured using 4-0 Monocryl subcuticular sutures followed by Steri-  Strips and sterile dressing.  The patient was taken to the recovery room  having tolerated the surgery well.        ___________________________________________  Almedia Balls. Ranell Patrick, M.D.    SRN/MEDQ  D:  01/05/2005  T:  01/05/2005  Job:  [id number]

## 2011-07-31 ENCOUNTER — Inpatient Hospital Stay (INDEPENDENT_AMBULATORY_CARE_PROVIDER_SITE_OTHER)
Admission: RE | Admit: 2011-07-31 | Discharge: 2011-07-31 | Disposition: A | Discharge status: Home / Self Care | Payer: 59 | Source: Ambulatory Visit | Attending: Family Medicine | Admitting: Family Medicine

## 2011-07-31 DIAGNOSIS — T65891A Toxic effect of other specified substances, accidental (unintentional), initial encounter: Secondary | ICD-10-CM

## 2011-09-16 ENCOUNTER — Emergency Department (HOSPITAL_COMMUNITY)
Admission: EM | Admit: 2011-09-16 | Discharge: 2011-09-16 | Disposition: A | Discharge status: Home / Self Care | Payer: 59 | Attending: Emergency Medicine | Admitting: Emergency Medicine

## 2011-09-16 ENCOUNTER — Encounter (HOSPITAL_COMMUNITY): Payer: Self-pay | Admitting: *Deleted

## 2011-09-16 DIAGNOSIS — Z7982 Long term (current) use of aspirin: Secondary | ICD-10-CM | POA: Insufficient documentation

## 2011-09-16 DIAGNOSIS — K5900 Constipation, unspecified: Secondary | ICD-10-CM | POA: Insufficient documentation

## 2011-09-16 DIAGNOSIS — I10 Essential (primary) hypertension: Secondary | ICD-10-CM | POA: Insufficient documentation

## 2011-09-16 DIAGNOSIS — L2989 Other pruritus: Secondary | ICD-10-CM | POA: Insufficient documentation

## 2011-09-16 DIAGNOSIS — T50B95A Adverse effect of other viral vaccines, initial encounter: Secondary | ICD-10-CM | POA: Insufficient documentation

## 2011-09-16 DIAGNOSIS — Z860100 Personal history of colon polyps, unspecified: Secondary | ICD-10-CM | POA: Insufficient documentation

## 2011-09-16 DIAGNOSIS — I4891 Unspecified atrial fibrillation: Secondary | ICD-10-CM | POA: Insufficient documentation

## 2011-09-16 DIAGNOSIS — K219 Gastro-esophageal reflux disease without esophagitis: Secondary | ICD-10-CM | POA: Insufficient documentation

## 2011-09-16 DIAGNOSIS — T50A95A Adverse effect of other bacterial vaccines, initial encounter: Secondary | ICD-10-CM | POA: Insufficient documentation

## 2011-09-16 DIAGNOSIS — T7840XA Allergy, unspecified, initial encounter: Secondary | ICD-10-CM

## 2011-09-16 DIAGNOSIS — L298 Other pruritus: Secondary | ICD-10-CM | POA: Insufficient documentation

## 2011-09-16 MED ORDER — PREDNISONE 10 MG PO TABS: 20.0000 mg | ORAL_TABLET | Freq: Every day | ORAL | Status: AC

## 2011-09-16 MED ORDER — DIPHENHYDRAMINE HCL 25 MG PO TABS: 25.0000 mg | ORAL_TABLET | Freq: Four times a day (QID) | ORAL | Status: AC | PRN

## 2011-09-16 NOTE — ED Notes (Signed)
Pt reports he got a flu shot 2 weeks ago and now is having itching all over

## 2011-09-16 NOTE — ED Provider Notes (Signed)
History   Scribed for Felisa Bonier, MD, the patient was seen in room APA12/APA12. This chart was scribed by Clarita Crane. This patient's care was started at 7:37AM.  CSN: [id number] Arrival date & time: 09/16/2011  6:45 AM  Chief Complaint  Patient presents with  . Pruritis   HPI Joe Lee is a 77 y.o. male who presents to the Emergency Department complaining of constant pruritus to right upper arm, right side of back and left lower leg onset 2 weeks ago after receiving the influenza vaccine in right upper lateral arm and persistent since. Patient also notes swelling about area of injection site to right upper lateral arm. Denies n/v, diarrhea, SOB, swelling of throat, swelling of tongue, difficulty swallowing, fever, chills. Patient with h/o hyperlipidemia, atrial fibrillation, denies h/o diabetes. Patient currently on Coumadin  HPI ELEMENTS: Location: Right upper arm, left lower leg, right side back Onset: 2 weeks ago Duration: persistent since onset  Timing: constant  Context:  as above  Associated symptoms: +swelling to right upper arm.  Denies n/v, diarrhea, SOB, swelling of throat, swelling of tongue, difficulty swallowing, fever, chills.   PAST MEDICAL HISTORY:  Past Medical History  Diagnosis Date  . AF (atrial fibrillation)   . GERD (gastroesophageal reflux disease)   . Diverticulitis   . Chronic constipation   . HTN (hypertension)   . Hyperlipidemia   . Colon polyp 1/07    adenomatous    PAST SURGICAL HISTORY:  Past Surgical History  Procedure Date  . Cholecystectomy   . Knee surgery   . Hemorrhoid surgery   . Rotator cuff repair   . Av fistula repair     FAMILY HISTORY:  No family history on file.   SOCIAL HISTORY: History   Social History  . Marital Status: Married    Spouse Name: N/A    Number of Children: N/A  . Years of Education: N/A   Social History Main Topics  . Smoking status: None  . Smokeless tobacco: None  . Alcohol Use: None   . Drug Use: None  . Sexually Active: None   Other Topics Concern  . None   Social History Narrative  . None      Review of Systems 10 Systems reviewed and are negative for acute change except as noted in the HPI.  Allergies  Penicillins  Home Medications   Current Outpatient Rx  Name Route Sig Dispense Refill  . ASPIRIN 81 MG PO TABS Oral Take 81 mg by mouth daily.      Marland Kitchen DILTIAZEM HCL COATED BEADS 180 MG PO CP24 Oral Take 180 mg by mouth daily.      . OMEGA-3 FATTY ACIDS 1000 MG PO CAPS Oral Take 2 g by mouth daily.      Marland Kitchen GARLIC PO Oral Take by mouth.      . METOPROLOL TARTRATE 50 MG PO TABS Oral Take 50 mg by mouth 2 (two) times daily.      . MULTIVITAMINS PO CAPS Oral Take 1 capsule by mouth daily.      Marland Kitchen POLYETHYLENE GLYCOL 3350 PO PACK Oral Take 17 g by mouth daily.      Marland Kitchen SIMVASTATIN 20 MG PO TABS Oral Take 20 mg by mouth at bedtime.      . WARFARIN SODIUM 5 MG PO TABS Oral Take 5 mg by mouth daily.        BP 141/88  Pulse 61  Temp(Src) 98 F (36.7 C) (Oral)  Resp  20  SpO2 99%  Physical Exam  Nursing note and vitals reviewed. Constitutional: He is oriented to person, place, and time. He appears well-developed and well-nourished. No distress.  HENT:  Head: Normocephalic and atraumatic.  Mouth/Throat: Oropharynx is clear and moist.       Right and left TM's normal. Mucous membranes moist. No mucosal or oral lesions. No erythema noted to posterior oropharynx.   Eyes: EOM are normal. Pupils are equal, round, and reactive to light.  Neck: Neck supple. No tracheal deviation present.  Cardiovascular: Normal rate, regular rhythm and normal heart sounds.   No murmur heard. Pulmonary/Chest: Effort normal and breath sounds normal. No respiratory distress. He has no wheezes. He has no rales.  Abdominal: He exhibits no distension.  Musculoskeletal: Normal range of motion. He exhibits no edema.       Tenderness and mild swelling with warmth to right upper lateral arm  where received flu immunization with no erythema noted. No lymphadenopathy noted to region.   Lymphadenopathy:    He has no cervical adenopathy.  Neurological: He is alert and oriented to person, place, and time. No sensory deficit.  Skin: Skin is warm and dry. No rash noted. No erythema.  Psychiatric: He has a normal mood and affect. His behavior is normal.    ED Course  Procedures MDM   OTHER DATA REVIEWED: Nursing notes, vital signs, and past medical records reviewed. Lab results reviewed and considered Imaging results reviewed and considered  DIAGNOSTIC STUDIES:  LABS / RADIOLOGY:  No results found.  PROCEDURES:  ED COURSE / COORDINATION OF CARE: No orders of the defined types were placed in this encounter.     MDM: Differential Diagnosis: With the warmth and tenderness at the injection site where received influenza immunization, a mild allergic reaction is indeed possible and most likely given the lack of rash or other findings. Other consideration includes scabies, but there are not characteristic lesions of scabies bites at distal extremities nor are the symptoms localized there. There are no other apparent signs of allergic rxn including no oropharyngeal edema, no difficulty swallowing and no difficulty breathing. I will treat this with antihistamines and low dose cortico steroids and have patient follow up in 5 days if symptoms persist.   PLAN: Discharge Home with instructions for follow up if symptoms persist The patient is to return the emergency department if there is any worsening of symptoms. I have reviewed the discharge instructions with the patient/family  CONDITION ON DISCHARGE: Good  DIAGNOSIS: 1. Allergic reaction to drug      MEDICATIONS GIVEN IN THE E.D. Medications - No data to display    I personally performed the services described in this documentation, which was scribed in my presence. The recorded information has been reviewed and  considered.   Felisa Bonier, MD 09/16/11 [phone]

## 2011-09-30 ENCOUNTER — Inpatient Hospital Stay (INDEPENDENT_AMBULATORY_CARE_PROVIDER_SITE_OTHER)
Admission: RE | Admit: 2011-09-30 | Discharge: 2011-09-30 | Disposition: A | Discharge status: Home / Self Care | Payer: 59 | Source: Ambulatory Visit | Attending: Family Medicine | Admitting: Family Medicine

## 2011-09-30 DIAGNOSIS — L259 Unspecified contact dermatitis, unspecified cause: Secondary | ICD-10-CM

## 2012-07-21 ENCOUNTER — Encounter (HOSPITAL_COMMUNITY): Payer: Self-pay | Admitting: Pharmacy Technician

## 2012-07-21 NOTE — Patient Instructions (Addendum)
Your procedure is scheduled on: 07/28/2012  Report to [facility] at  630      AM.  Call this number if you have problems the morning of surgery: [phone]   Do not eat food or drink liquids :After Midnight.      Take these medicines the morning of surgery with A SIP OF WATER:diltiazem,lopressor   Do not wear jewelry, make-up or nail polish.  Do not wear lotions, powders, or perfumes. You may wear deodorant.  Do not shave 48 hours prior to surgery.  Do not bring valuables to the hospital.  Contacts, dentures or bridgework may not be worn into surgery.  Leave suitcase in the car. After surgery it may be brought to your room.  For patients admitted to the hospital, checkout time is 11:00 AM the day of discharge.   Patients discharged the day of surgery will not be allowed to drive home.  :     Please read over the following fact sheets that you were given: Coughing and Deep Breathing, Surgical Site Infection Prevention, Anesthesia Post-op Instructions and Care and Recovery After Surgery    Cataract A cataract is a clouding of the lens of the eye. When a lens becomes cloudy, vision is reduced based on the degree and nature of the clouding. Many cataracts reduce vision to some degree. Some cataracts make people more near-sighted as they develop. Other cataracts increase glare. Cataracts that are ignored and become worse can sometimes look white. The white color can be seen through the pupil. CAUSES   Aging. However, cataracts may occur at any age, even in newborns.   Certain drugs.   Trauma to the eye.   Certain diseases such as diabetes.   Specific eye diseases such as chronic inflammation inside the eye or a sudden attack of a rare form of glaucoma.   Inherited or acquired medical problems.  SYMPTOMS   Gradual, progressive drop in vision in the affected eye.   Severe, rapid visual loss. This most often happens when trauma is the cause.  DIAGNOSIS  To detect a cataract, an eye  doctor examines the lens. Cataracts are best diagnosed with an exam of the eyes with the pupils enlarged (dilated) by drops.  TREATMENT  For an early cataract, vision may improve by using different eyeglasses or stronger lighting. If that does not help your vision, surgery is the only effective treatment. A cataract needs to be surgically removed when vision loss interferes with your everyday activities, such as driving, reading, or watching TV. A cataract may also have to be removed if it prevents examination or treatment of another eye problem. Surgery removes the cloudy lens and usually replaces it with a substitute lens (intraocular lens, IOL).  At a time when both you and your doctor agree, the cataract will be surgically removed. If you have cataracts in both eyes, only one is usually removed at a time. This allows the operated eye to heal and be out of danger from any possible problems after surgery (such as infection or poor wound healing). In rare cases, a cataract may be doing damage to your eye. In these cases, your caregiver may advise surgical removal right away. The vast majority of people who have cataract surgery have better vision afterward. HOME CARE INSTRUCTIONS  If you are not planning surgery, you may be asked to do the following:  Use different eyeglasses.   Use stronger or brighter lighting.   Ask your eye doctor about reducing your  medicine dose or changing medicines if it is thought that a medicine caused your cataract. Changing medicines does not make the cataract go away on its own.   Become familiar with your surroundings. Poor vision can lead to injury. Avoid bumping into things on the affected side. You are at a higher risk for tripping or falling.   Exercise extreme care when driving or operating machinery.   Wear sunglasses if you are sensitive to bright light or experiencing problems with glare.  SEEK IMMEDIATE MEDICAL CARE IF:   You have a worsening or sudden  vision loss.   You notice redness, swelling, or increasing pain in the eye.   You have a fever.  Document Released: 12/02/2005 Document Revised: 11/21/2011 Document Reviewed: 07/26/2011 [facility] Patient Information 2012 [place].PATIENT INSTRUCTIONS POST-ANESTHESIA  IMMEDIATELY FOLLOWING SURGERY:  Do not drive or operate machinery for the first twenty four hours after surgery.  Do not make any important decisions for twenty four hours after surgery or while taking narcotic pain medications or sedatives.  If you develop intractable nausea and vomiting or a severe headache please notify your doctor immediately.  FOLLOW-UP:  Please make an appointment with your surgeon as instructed. You do not need to follow up with anesthesia unless specifically instructed to do so.  WOUND CARE INSTRUCTIONS (if applicable):  Keep a dry clean dressing on the anesthesia/puncture wound site if there is drainage.  Once the wound has quit draining you may leave it open to air.  Generally you should leave the bandage intact for twenty four hours unless there is drainage.  If the epidural site drains for more than 36-48 hours please call the anesthesia department.  QUESTIONS?:  Please feel free to call your physician or the hospital operator if you have any questions, and they will be happy to assist you.

## 2012-07-22 ENCOUNTER — Other Ambulatory Visit: Payer: Self-pay

## 2012-07-22 ENCOUNTER — Encounter (HOSPITAL_COMMUNITY): Payer: Self-pay

## 2012-07-22 ENCOUNTER — Ambulatory Visit (HOSPITAL_COMMUNITY)
Admission: RE | Admit: 2012-07-22 | Discharge: 2012-07-22 | Disposition: A | Discharge status: Home / Self Care | Payer: 59 | Source: Ambulatory Visit | Attending: Ophthalmology | Admitting: Ophthalmology

## 2012-07-22 LAB — BASIC METABOLIC PANEL
CO2: 32 mEq/L (ref 19–32)
Chloride: 106 mEq/L (ref 96–112)
Creatinine, Ser: 1.21 mg/dL (ref 0.50–1.35)
Potassium: 4.1 mEq/L (ref 3.5–5.1)

## 2012-07-22 LAB — HEMOGLOBIN AND HEMATOCRIT, BLOOD: Hemoglobin: 13.1 g/dL (ref 13.0–17.0)

## 2012-07-27 MED ORDER — CYCLOPENTOLATE HCL 1 % OP SOLN
OPHTHALMIC | Status: AC
  Filled 2012-07-27: qty 2

## 2012-07-27 MED ORDER — PHENYLEPHRINE HCL 2.5 % OP SOLN
OPHTHALMIC | Status: AC
  Filled 2012-07-27: qty 2

## 2012-07-27 MED ORDER — LIDOCAINE HCL 3.5 % OP GEL: 1.0000 "application " | Freq: Once | OPHTHALMIC | Status: DC

## 2012-07-27 MED ORDER — FLURBIPROFEN SODIUM 0.03 % OP SOLN
OPHTHALMIC | Status: AC
  Filled 2012-07-27: qty 2.5

## 2012-07-27 MED ORDER — TETRACAINE HCL 0.5 % OP SOLN
OPHTHALMIC | Status: AC
  Filled 2012-07-27: qty 2

## 2012-07-28 ENCOUNTER — Encounter (HOSPITAL_COMMUNITY)
Admission: RE | Disposition: A | Discharge status: Home / Self Care | Payer: Self-pay | Source: Ambulatory Visit | Attending: Ophthalmology

## 2012-07-28 ENCOUNTER — Encounter (HOSPITAL_COMMUNITY): Payer: Self-pay | Admitting: *Deleted

## 2012-07-28 ENCOUNTER — Ambulatory Visit (HOSPITAL_COMMUNITY)
Admission: RE | Admit: 2012-07-28 | Discharge: 2012-07-28 | Disposition: A | Discharge status: Home / Self Care | Payer: 59 | Source: Ambulatory Visit | Attending: Ophthalmology | Admitting: Ophthalmology

## 2012-07-28 ENCOUNTER — Encounter (HOSPITAL_COMMUNITY): Payer: Self-pay | Admitting: Anesthesiology

## 2012-07-28 ENCOUNTER — Ambulatory Visit (HOSPITAL_COMMUNITY): Payer: 59 | Admitting: Anesthesiology

## 2012-07-28 DIAGNOSIS — Z01812 Encounter for preprocedural laboratory examination: Secondary | ICD-10-CM | POA: Insufficient documentation

## 2012-07-28 DIAGNOSIS — I10 Essential (primary) hypertension: Secondary | ICD-10-CM | POA: Insufficient documentation

## 2012-07-28 DIAGNOSIS — H2510 Age-related nuclear cataract, unspecified eye: Secondary | ICD-10-CM | POA: Insufficient documentation

## 2012-07-28 DIAGNOSIS — Z79899 Other long term (current) drug therapy: Secondary | ICD-10-CM | POA: Insufficient documentation

## 2012-07-28 DIAGNOSIS — Z01810 Encounter for preprocedural cardiovascular examination: Secondary | ICD-10-CM | POA: Insufficient documentation

## 2012-07-28 HISTORY — PX: CATARACT EXTRACTION W/PHACO: SHX586

## 2012-07-28 SURGERY — PHACOEMULSIFICATION, CATARACT, WITH IOL INSERTION
Anesthesia: Monitor Anesthesia Care | Site: Eye | Laterality: Right | Wound class: Clean

## 2012-07-28 MED ORDER — BSS IO SOLN
INTRAOCULAR | Status: DC | PRN
  Administered 2012-07-28: 15 mL via INTRAOCULAR

## 2012-07-28 MED ORDER — EPINEPHRINE HCL 1 MG/ML IJ SOLN
INTRAOCULAR | Status: DC | PRN
  Administered 2012-07-28: 08:00:00

## 2012-07-28 MED ORDER — LACTATED RINGERS IV SOLN
INTRAVENOUS | Status: DC
  Administered 2012-07-28: 1000 mL via INTRAVENOUS

## 2012-07-28 MED ORDER — TETRACAINE 0.5 % OP SOLN OPTIME - NO CHARGE
OPHTHALMIC | Status: DC | PRN
  Administered 2012-07-28: 2 [drp] via OPHTHALMIC

## 2012-07-28 MED ORDER — FLURBIPROFEN SODIUM 0.03 % OP SOLN
1.0000 [drp] | OPHTHALMIC | Status: AC
  Administered 2012-07-28 (×3): 1 [drp] via OPHTHALMIC

## 2012-07-28 MED ORDER — TETRACAINE HCL 0.5 % OP SOLN
1.0000 [drp] | OPHTHALMIC | Status: AC
  Administered 2012-07-28 (×3): 1 [drp] via OPHTHALMIC

## 2012-07-28 MED ORDER — LIDOCAINE HCL (PF) 1 % IJ SOLN
INTRAMUSCULAR | Status: AC
  Filled 2012-07-28: qty 2

## 2012-07-28 MED ORDER — PROVISC 10 MG/ML IO SOLN
INTRAOCULAR | Status: DC | PRN
  Administered 2012-07-28: 8.5 mg via INTRAOCULAR

## 2012-07-28 MED ORDER — MIDAZOLAM HCL 2 MG/2ML IJ SOLN
INTRAMUSCULAR | Status: AC
  Filled 2012-07-28: qty 2

## 2012-07-28 MED ORDER — CYCLOPENTOLATE HCL 1 % OP SOLN
1.0000 [drp] | OPHTHALMIC | Status: AC
  Administered 2012-07-28 (×3): 1 [drp] via OPHTHALMIC

## 2012-07-28 MED ORDER — PHENYLEPHRINE HCL 2.5 % OP SOLN
1.0000 [drp] | OPHTHALMIC | Status: AC
  Administered 2012-07-28 (×3): 1 [drp] via OPHTHALMIC

## 2012-07-28 MED ORDER — MIDAZOLAM HCL 2 MG/2ML IJ SOLN
1.0000 mg | INTRAMUSCULAR | Status: DC | PRN
  Administered 2012-07-28: 2 mg via INTRAVENOUS

## 2012-07-28 MED ORDER — LIDOCAINE HCL (PF) 1 % IJ SOLN
INTRAMUSCULAR | Status: DC | PRN
  Administered 2012-07-28: .5 mL

## 2012-07-28 MED ORDER — EPINEPHRINE HCL 1 MG/ML IJ SOLN
INTRAMUSCULAR | Status: AC
  Filled 2012-07-28: qty 1

## 2012-07-28 SURGICAL SUPPLY — 9 items
CLOTH BEACON ORANGE TIMEOUT ST (SAFETY) ×1 IMPLANT
EYE SHIELD UNIVERSAL CLEAR (GAUZE/BANDAGES/DRESSINGS) ×1 IMPLANT
GLOVE BIO SURGEON STRL SZ 6.5 (GLOVE) ×1 IMPLANT
GLOVE EXAM NITRILE MD LF STRL (GLOVE) ×1 IMPLANT
PAD ARMBOARD 7.5X6 YLW CONV (MISCELLANEOUS) ×1 IMPLANT
SIGHTPATH CAT PROC W REG LENS (Ophthalmic Related) ×2 IMPLANT
TAPE SURG TRANSPORE 1 IN (GAUZE/BANDAGES/DRESSINGS) IMPLANT
TAPE SURGICAL TRANSPORE 1 IN (GAUZE/BANDAGES/DRESSINGS) ×1
WATER STERILE IRR 250ML POUR (IV SOLUTION) ×1 IMPLANT

## 2012-07-28 NOTE — Transfer of Care (Signed)
Immediate Anesthesia Transfer of Care Note  Patient: Joe Lee  Procedure(s) Performed: Procedure(s) (LRB): CATARACT EXTRACTION PHACO AND INTRAOCULAR LENS PLACEMENT (IOC) (Right)  Patient Location: Shortstay  Anesthesia Type: MAC  Level of Consciousness: awake  Airway & Oxygen Therapy: Patient Spontanous Breathing   Post-op Assessment: Report given to PACU RN, Post -op Vital signs reviewed and stable and Patient moving all extremities  Post vital signs: Reviewed and stable  Complications: No apparent anesthesia complications

## 2012-07-28 NOTE — H&P (Signed)
The patient was re examined and there is no change in the patients condition since the original H and P. 

## 2012-07-28 NOTE — Anesthesia Procedure Notes (Signed)
Procedure Name: MAC Date/Time: 07/28/2012 7:29 AM Performed by: Franco Nones Pre-anesthesia Checklist: Patient identified, Emergency Drugs available, Suction available, Timeout performed and Patient being monitored Patient Re-evaluated:Patient Re-evaluated prior to inductionOxygen Delivery Method: Nasal Cannula

## 2012-07-28 NOTE — Anesthesia Postprocedure Evaluation (Signed)
  Anesthesia Post-op Note  Patient: Joe Lee  Procedure(s) Performed: Procedure(s) (LRB): CATARACT EXTRACTION PHACO AND INTRAOCULAR LENS PLACEMENT (IOC) (Right)  Patient Location:  Short Stay  Anesthesia Type: MAC  Level of Consciousness: awake  Airway and Oxygen Therapy: Patient Spontanous Breathing  Post-op Pain: none  Post-op Assessment: Post-op Vital signs reviewed, Patient's Cardiovascular Status Stable, Respiratory Function Stable, Patent Airway, No signs of Nausea or vomiting and Pain level controlled  Post-op Vital Signs: Reviewed and stable  Complications: No apparent anesthesia complications

## 2012-07-28 NOTE — Anesthesia Preprocedure Evaluation (Signed)
Anesthesia Evaluation  Patient identified by MRN, date of birth, ID band Patient awake    Reviewed: Allergy & Precautions, H&P , NPO status , Patient's Chart, lab work & pertinent test results  Airway Mallampati: II      Dental  (+) Teeth Intact   Pulmonary neg pulmonary ROS,  breath sounds clear to auscultation        Cardiovascular hypertension, Pt. on medications + dysrhythmias Atrial Fibrillation Rhythm:Regular Rate:Normal     Neuro/Psych    GI/Hepatic GERD-  ,  Endo/Other    Renal/GU      Musculoskeletal   Abdominal   Peds  Hematology   Anesthesia Other Findings   Reproductive/Obstetrics                           Anesthesia Physical Anesthesia Plan  ASA: III  Anesthesia Plan: MAC   Post-op Pain Management:    Induction: Intravenous  Airway Management Planned: Nasal Cannula  Additional Equipment:   Intra-op Plan:   Post-operative Plan:   Informed Consent: I have reviewed the patients History and Physical, chart, labs and discussed the procedure including the risks, benefits and alternatives for the proposed anesthesia with the patient or authorized representative who has indicated his/her understanding and acceptance.     Plan Discussed with:   Anesthesia Plan Comments:         Anesthesia Quick Evaluation

## 2012-07-28 NOTE — Op Note (Signed)
Patient brought to the operating room and prepped and draped in the usual manner.  Lid speculum inserted in right eye.  Stab incision made at the twelve o'clock position.  Provisc instilled in the anterior chamber.   A 2.4 mm. Stab incision was made temporally.  An anterior capsulotomy was done with a bent 25 gauge needle.  The nucleus was hydrodissected.  The Phaco tip was inserted in the anterior chamber and the nucleus was emulsified.  CDE was 10.80.  The cortical material was then removed with the I and A tip.  Posterior capsule was the polished.  The anterior chamber was deepened with Provisc.  A 19.5 Diopter Rayner 570C IOL was then inserted in the capsular bag.  Provisc was then removed with the I and A tip.  The wound was then hydrated.  Patient sent to the Recovery Room in good condition with follow up in my office.

## 2012-07-29 ENCOUNTER — Encounter (HOSPITAL_COMMUNITY): Payer: Self-pay | Admitting: Emergency Medicine

## 2012-07-29 ENCOUNTER — Emergency Department (HOSPITAL_COMMUNITY)
Admission: EM | Admit: 2012-07-29 | Discharge: 2012-07-29 | Disposition: A | Discharge status: Home / Self Care | Payer: 59 | Attending: Emergency Medicine | Admitting: Emergency Medicine

## 2012-07-29 DIAGNOSIS — T7840XA Allergy, unspecified, initial encounter: Secondary | ICD-10-CM | POA: Insufficient documentation

## 2012-07-29 DIAGNOSIS — K219 Gastro-esophageal reflux disease without esophagitis: Secondary | ICD-10-CM | POA: Insufficient documentation

## 2012-07-29 DIAGNOSIS — I4891 Unspecified atrial fibrillation: Secondary | ICD-10-CM | POA: Insufficient documentation

## 2012-07-29 DIAGNOSIS — Z87891 Personal history of nicotine dependence: Secondary | ICD-10-CM | POA: Insufficient documentation

## 2012-07-29 DIAGNOSIS — L299 Pruritus, unspecified: Secondary | ICD-10-CM

## 2012-07-29 DIAGNOSIS — I10 Essential (primary) hypertension: Secondary | ICD-10-CM | POA: Insufficient documentation

## 2012-07-29 DIAGNOSIS — E785 Hyperlipidemia, unspecified: Secondary | ICD-10-CM | POA: Insufficient documentation

## 2012-07-29 MED ORDER — DIPHENHYDRAMINE HCL 25 MG PO CAPS
25.0000 mg | ORAL_CAPSULE | Freq: Once | ORAL | Status: AC
  Administered 2012-07-29: 25 mg via ORAL
  Filled 2012-07-29: qty 1

## 2012-07-29 MED ORDER — FAMOTIDINE 20 MG PO TABS
20.0000 mg | ORAL_TABLET | Freq: Once | ORAL | Status: AC
  Administered 2012-07-29: 20 mg via ORAL
  Filled 2012-07-29 (×2): qty 1

## 2012-07-29 NOTE — ED Notes (Addendum)
C/o itching on arms, legs, and now itching on head above left ear.  Had no Benadryl to take at home.  Thinks this is related to medication received with anesthesia 8/13 when cataract surgery done on right eye.  Denies any respiratory distress

## 2012-07-29 NOTE — ED Notes (Signed)
Patient states he had cataract surgery yesterday and started having generalized rash and itching starting last night. Denies pain or shortness of breath.

## 2012-07-29 NOTE — ED Provider Notes (Signed)
History     CSN: [id number]  Arrival date & time 07/29/12  0315   First MD Initiated Contact with Patient 07/29/12 [phone]      Chief Complaint  Patient presents with  . Allergic Reaction    (Consider location/radiation/quality/duration/timing/severity/associated sxs/prior treatment) HPI Joe Lee is a 78 y.o. male who presents to the Emergency Department complaining of itching that began last night without any known cause. He had cataract surgery yesterday with no difficulty. Was awakened from sleep with itching to his arms, legs and neck. He states he feels like he has a rash, although one is not visible.  PCP Dr. Phillips Odor   Past Medical History  Diagnosis Date  . AF (atrial fibrillation)   . GERD (gastroesophageal reflux disease)   . Diverticulitis   . Chronic constipation   . HTN (hypertension)   . Hyperlipidemia   . Colon polyp 1/07    adenomatous    Past Surgical History  Procedure Date  . Knee surgery     right-arthroscopy  . Hemorrhoid surgery   . Rotator cuff repair     right  . Av fistula repair   . Cholecystectomy     Dr Katrinka Blazing -[facility]    No family history on file.  History  Substance Use Topics  . Smoking status: Former Smoker -- 0.2 packs/day for 6 years    Types: Cigarettes    Quit date: 07/22/1977  . Smokeless tobacco: Not on file  . Alcohol Use: No      Review of Systems  Constitutional: Negative for fever.       10 Systems reviewed and are negative for acute change except as noted in the HPI.  HENT: Negative for congestion.   Eyes: Negative for discharge and redness.  Respiratory: Negative for cough and shortness of breath.   Cardiovascular: Negative for chest pain.  Gastrointestinal: Negative for vomiting and abdominal pain.  Musculoskeletal: Negative for back pain.  Skin: Negative for rash.       itching  Neurological: Negative for syncope, numbness and headaches.  Psychiatric/Behavioral:       No behavior change.     Allergies  Penicillins  Home Medications   Current Outpatient Rx  Name Route Sig Dispense Refill  . BEANO PO Oral Take 1 tablet by mouth daily as needed. For gas    . APIXABAN 5 MG PO TABS Oral Take 5 mg by mouth 2 (two) times daily.    . ASPIRIN EC 81 MG PO TBEC Oral Take 81 mg by mouth daily.    Marland Kitchen DILTIAZEM HCL ER COATED BEADS 180 MG PO CP24 Oral Take 180 mg by mouth daily.      Marland Kitchen DOXYCYCLINE HYCLATE 100 MG PO TABS Oral Take 100 mg by mouth 2 (two) times daily as needed. For breakout    . OMEGA-3 FATTY ACIDS 1000 MG PO CAPS Oral Take 1 g by mouth daily.     Marland Kitchen GARLIC PO Oral Take 1 capsule by mouth daily.     Marland Kitchen HALOBETASOL PROPIONATE 0.05 % EX CREA Topical Apply 1 application topically as needed. For rash    . METOPROLOL TARTRATE 50 MG PO TABS Oral Take 50 mg by mouth 2 (two) times daily.      . ADULT MULTIVITAMIN W/MINERALS CH Oral Take 1 tablet by mouth daily.    . MULTIVITAMINS PO CAPS Oral Take 1 capsule by mouth daily.      Marland Kitchen NAPROXEN SODIUM 220 MG PO TABS Oral Take  220 mg by mouth every 8 (eight) hours as needed. For pain    . SIMVASTATIN 20 MG PO TABS Oral Take 20 mg by mouth at bedtime.        BP 143/94  Pulse 63  Temp 98.1 F (36.7 C) (Oral)  Resp 18  Ht 5\' 10"  (1.778 m)  Wt 220 lb (99.791 kg)  BMI 31.57 kg/m2  SpO2 100%  Physical Exam  Nursing note and vitals reviewed. Constitutional: He appears well-developed and well-nourished.       Awake, alert, nontoxic appearance.  HENT:  Head: Atraumatic.  Eyes: Right eye exhibits no discharge. Left eye exhibits no discharge.  Neck: Neck supple.  Cardiovascular: Normal heart sounds.   Pulmonary/Chest: Effort normal and breath sounds normal. He exhibits no tenderness.  Abdominal: Soft. There is no tenderness. There is no rebound.  Musculoskeletal: He exhibits no tenderness.       Baseline ROM, no obvious new focal weakness.  Neurological:       Mental status and motor strength appears baseline for patient and  situation.  Skin: No rash noted.       One insect bite to lower left leg otherwise no appreciable rash, hives, or lesions.   Psychiatric: He has a normal mood and affect.    ED Course  Procedures (including critical care time)     MDM  Patient awakened with itching. No rash noted. Given benadryl and pepcid with relief of symptoms.  Pt feels improved after observation and/or treatment in ED.Pt stable in ED with no significant deterioration in condition.The patient appears reasonably screened and/or stabilized for discharge and I doubt any other medical condition or other [facility] requiring further screening, evaluation, or treatment in the ED at this time prior to discharge.  MDM Reviewed: nursing note and vitals           Nicoletta Dress. Colon Branch, MD 07/29/12 [phone]

## 2012-07-29 NOTE — ED Notes (Signed)
Itching is decreasing and - no new areas appearing.  States he feels much better

## 2012-07-30 ENCOUNTER — Encounter (HOSPITAL_COMMUNITY): Payer: Self-pay | Admitting: Ophthalmology

## 2012-11-13 ENCOUNTER — Other Ambulatory Visit (HOSPITAL_COMMUNITY): Payer: Self-pay | Admitting: Family Medicine

## 2012-11-13 ENCOUNTER — Ambulatory Visit (HOSPITAL_COMMUNITY)
Admission: RE | Admit: 2012-11-13 | Discharge: 2012-11-13 | Disposition: A | Discharge status: Home / Self Care | Payer: 59 | Source: Ambulatory Visit | Attending: Family Medicine | Admitting: Family Medicine

## 2012-11-13 DIAGNOSIS — E785 Hyperlipidemia, unspecified: Secondary | ICD-10-CM

## 2012-11-13 DIAGNOSIS — J3489 Other specified disorders of nose and nasal sinuses: Secondary | ICD-10-CM | POA: Insufficient documentation

## 2012-11-13 DIAGNOSIS — I4891 Unspecified atrial fibrillation: Secondary | ICD-10-CM

## 2012-11-13 DIAGNOSIS — I10 Essential (primary) hypertension: Secondary | ICD-10-CM

## 2012-11-13 DIAGNOSIS — R05 Cough: Secondary | ICD-10-CM | POA: Insufficient documentation

## 2012-11-13 DIAGNOSIS — R059 Cough, unspecified: Secondary | ICD-10-CM | POA: Insufficient documentation

## 2012-12-03 ENCOUNTER — Other Ambulatory Visit (HOSPITAL_COMMUNITY): Payer: Self-pay | Admitting: Orthopedic Surgery

## 2012-12-03 DIAGNOSIS — D492 Neoplasm of unspecified behavior of bone, soft tissue, and skin: Secondary | ICD-10-CM

## 2012-12-04 ENCOUNTER — Ambulatory Visit (HOSPITAL_COMMUNITY)
Admission: RE | Admit: 2012-12-04 | Discharge: 2012-12-04 | Disposition: A | Discharge status: Home / Self Care | Payer: 59 | Source: Ambulatory Visit | Attending: Orthopedic Surgery | Admitting: Orthopedic Surgery

## 2012-12-04 ENCOUNTER — Ambulatory Visit (HOSPITAL_COMMUNITY): Payer: 59

## 2012-12-04 DIAGNOSIS — D492 Neoplasm of unspecified behavior of bone, soft tissue, and skin: Secondary | ICD-10-CM

## 2012-12-04 DIAGNOSIS — M6740 Ganglion, unspecified site: Secondary | ICD-10-CM | POA: Insufficient documentation

## 2012-12-04 DIAGNOSIS — M25539 Pain in unspecified wrist: Secondary | ICD-10-CM | POA: Insufficient documentation

## 2013-04-13 ENCOUNTER — Encounter: Payer: Self-pay | Admitting: *Deleted

## 2013-05-20 ENCOUNTER — Ambulatory Visit (INDEPENDENT_AMBULATORY_CARE_PROVIDER_SITE_OTHER): Payer: 59 | Admitting: Internal Medicine

## 2013-05-20 ENCOUNTER — Encounter: Payer: Self-pay | Admitting: Internal Medicine

## 2013-05-20 ENCOUNTER — Ambulatory Visit: Payer: 59 | Admitting: Internal Medicine

## 2013-05-20 VITALS — BP 122/82 | HR 64 | Ht 70.0 in | Wt 223.6 lb

## 2013-05-20 DIAGNOSIS — I10 Essential (primary) hypertension: Secondary | ICD-10-CM | POA: Insufficient documentation

## 2013-05-20 DIAGNOSIS — I4891 Unspecified atrial fibrillation: Secondary | ICD-10-CM

## 2013-05-20 DIAGNOSIS — I480 Paroxysmal atrial fibrillation: Secondary | ICD-10-CM

## 2013-05-20 DIAGNOSIS — Z79899 Other long term (current) drug therapy: Secondary | ICD-10-CM

## 2013-05-20 DIAGNOSIS — I83893 Varicose veins of bilateral lower extremities with other complications: Secondary | ICD-10-CM

## 2013-05-20 DIAGNOSIS — I8393 Asymptomatic varicose veins of bilateral lower extremities: Secondary | ICD-10-CM | POA: Insufficient documentation

## 2013-05-20 DIAGNOSIS — E785 Hyperlipidemia, unspecified: Secondary | ICD-10-CM

## 2013-05-20 NOTE — Progress Notes (Signed)
OFFICE NOTE  Chief Complaint:   routine follow-up  Primary Care Physician: Joe Ribas, MD  HPI:  Joe Lee is a 79 year old gentleman with history of paroxysmal A-fib, mild nonobstructive coronary disease, hypertension and dyslipidemia. He also has CPAP and has done well with that. He remains active although has had a few pounds of weight gain, really has no symptoms. We talked about stroke prevention with regard to paroxysmal atrial fibrillation which he has had in the past and he was on Coumadin. He has since switched to Eliquis 5 mg by mouth twice a day and is tolerating this very well.  He denies any bleeding complications. He has not had any recurrent atrial fibrillation and he is aware of.  Overall he denies any chest pain or worsening shortness of breath. He does report some lower leg swelling, which is improved with wearing stockings. He is interested in a new pair of stockings today.  PMHx:  Past Medical History  Diagnosis Date  . AF (atrial fibrillation) 06/06/2010    2D Echo EF>55% normal Echo  . GERD (gastroesophageal reflux disease)   . Diverticulitis   . Chronic constipation   . HTN (hypertension)   . Hyperlipidemia   . Colon polyp 1/07    adenomatous  . OSA on CPAP 11/09/2007    sleep study REM AHI was 3.64hr at 7cm  . SOB (shortness of breath) 05/30/2010    stress test normal stress test EF63%    Past Surgical History  Procedure Laterality Date  . Knee surgery      right-arthroscopy  . Hemorrhoid surgery    . Rotator cuff repair      right  . Av fistula repair    . Cholecystectomy      Dr Joe Lee -[facility]  . Cataract extraction w/phaco  07/28/2012    Procedure: CATARACT EXTRACTION PHACO AND INTRAOCULAR LENS PLACEMENT (IOC);  Surgeon: Joe Leriche T. Nile Riggs, MD;  Location: [facility];  Service: Ophthalmology;  Laterality: Right;  CDE=10.80  . Hernia repair    . Cardiac catheterization  02/06/2007    FAMHx:  History reviewed. No pertinent family  history.  SOCHx:   reports that he quit smoking about 35 years ago. His smoking use included Cigarettes. He has a 1.5 pack-year smoking history. He does not have any smokeless tobacco history on file. He reports that he does not drink alcohol or use illicit drugs.  ALLERGIES:  Allergies  Allergen Reactions  . Penicillins Hives and Itching    ROS: A comprehensive review of systems was negative except for: Cardiovascular: positive for irregular heart beat and lower extremity edema  HOME MEDS: Current Outpatient Prescriptions  Medication Sig Dispense Refill  . Alpha-D-Galactosidase (BEANO PO) Take 1 tablet by mouth daily as needed. For gas      . apixaban (ELIQUIS) 5 MG TABS tablet Take 5 mg by mouth 2 (two) times daily.      Marland Kitchen aspirin EC 81 MG tablet Take 81 mg by mouth daily.      Marland Kitchen diltiazem (CARDIZEM CD) 180 MG 24 hr capsule Take 180 mg by mouth daily.        Marland Kitchen doxycycline (VIBRA-TABS) 100 MG tablet Take 100 mg by mouth 2 (two) times daily as needed. For breakout      . fish oil-omega-3 fatty acids 1000 MG capsule Take 1 g by mouth daily.       Marland Kitchen GARLIC PO Take 1 capsule by mouth daily.       Marland Kitchen  halobetasol (ULTRAVATE) 0.05 % cream Apply 1 application topically as needed. For rash      . metoprolol (LOPRESSOR) 50 MG tablet Take 50 mg by mouth 2 (two) times daily.        . Multiple Vitamin (MULTIVITAMIN WITH MINERALS) TABS Take 1 tablet by mouth daily.      . naproxen sodium (ANAPROX) 220 MG tablet Take 220 mg by mouth every 8 (eight) hours as needed. For pain      . simvastatin (ZOCOR) 20 MG tablet Take 20 mg by mouth at bedtime.         No current facility-administered medications for this visit.    LABS/IMAGING: No results found for this or any previous visit (from the past 48 hour(s)). No results found.  VITALS: BP 122/82  Pulse 64  Ht 5\' 10"  (1.778 m)  Wt 223 lb 9.6 oz (101.424 kg)  BMI 32.08 kg/m2  EXAM: General appearance: alert and no distress Neck: no adenopathy,  no carotid bruit, no JVD, supple, symmetrical, trachea midline and thyroid not enlarged, symmetric, no tenderness/mass/nodules Lungs: clear to auscultation bilaterally Heart: regular rate and rhythm, S1, S2 normal, no murmur, click, rub or gallop Abdomen: soft, non-tender; bowel sounds normal; no masses,  no organomegaly Extremities: extremities normal, atraumatic, no cyanosis or edema and There is a lipoma over the left scapula Pulses: 2+ and symmetric Skin: Skin color, texture, turgor normal. No rashes or lesions Neurologic: Grossly normal  EKG: Sinus rhythm with PACs at 64  ASSESSMENT: 1. Paroxysmal atrial fibrillation-now in sinus 2. Hypertension 3. Dyslipidemia 4. Obstructive sleep apnea on CPAP 5. Chronic venous insufficiency with edema  PLAN: 1.   Overall Joe Lee is doing well. He is requesting a new prescription for compression stockings today which will provide. He reports doing very well with Eliquis. Vitals including blood pressure appear well-controlled today. I will plan to make no changes to his medicines today and see him back in one years time.  Joe Nose, MD, Joe Lee Attending Cardiologist [facility]  Joe Lee 05/20/2013, 11:53 AM

## 2013-05-20 NOTE — Patient Instructions (Signed)
Your physician recommends that you return for lab. You will need to not eat or drink anything after midnight the evening prior to your labwork. CMP, Lipids  Your physician has ordered you to wear compression stockings during the day, and take them off at night.   You should follow up with Dr. Rennis Golden in 1 year.

## 2013-06-14 LAB — COMPREHENSIVE METABOLIC PANEL
ALT: 18 U/L (ref 0–53)
Alkaline Phosphatase: 63 U/L (ref 39–117)
Potassium: 4.5 mEq/L (ref 3.5–5.3)
Sodium: 138 mEq/L (ref 135–145)
Total Bilirubin: 0.6 mg/dL (ref 0.3–1.2)
Total Protein: 6.9 g/dL (ref 6.0–8.3)

## 2013-06-14 LAB — LIPID PANEL
LDL Cholesterol: 43 mg/dL (ref 0–99)
VLDL: 14 mg/dL (ref 0–40)

## 2013-11-01 ENCOUNTER — Other Ambulatory Visit: Payer: Self-pay | Admitting: Pharmacist Clinician (PhC)/ Clinical Pharmacy Specialist

## 2013-11-01 MED ORDER — APIXABAN 5 MG PO TABS: 5.0000 mg | ORAL_TABLET | Freq: Two times a day (BID) | ORAL | Status: DC

## 2013-11-02 ENCOUNTER — Other Ambulatory Visit: Payer: Self-pay | Admitting: *Deleted

## 2013-11-02 MED ORDER — SIMVASTATIN 20 MG PO TABS: 20.0000 mg | ORAL_TABLET | Freq: Every day | ORAL | Status: DC

## 2013-11-02 MED ORDER — METOPROLOL TARTRATE 50 MG PO TABS: 50.0000 mg | ORAL_TABLET | Freq: Two times a day (BID) | ORAL | Status: DC

## 2013-11-02 MED ORDER — DILTIAZEM HCL ER COATED BEADS 180 MG PO CP24: 180.0000 mg | ORAL_CAPSULE | Freq: Every day | ORAL | Status: DC

## 2014-05-23 ENCOUNTER — Other Ambulatory Visit: Payer: Self-pay | Admitting: Internal Medicine

## 2014-06-06 ENCOUNTER — Encounter: Payer: Self-pay | Admitting: Internal Medicine

## 2014-06-06 ENCOUNTER — Ambulatory Visit (INDEPENDENT_AMBULATORY_CARE_PROVIDER_SITE_OTHER): Payer: 59 | Admitting: Internal Medicine

## 2014-06-06 ENCOUNTER — Ambulatory Visit: Payer: 59 | Admitting: Internal Medicine

## 2014-06-06 VITALS — BP 140/80 | HR 71 | Ht 70.5 in | Wt 220.5 lb

## 2014-06-06 DIAGNOSIS — I4891 Unspecified atrial fibrillation: Secondary | ICD-10-CM

## 2014-06-06 DIAGNOSIS — I83893 Varicose veins of bilateral lower extremities with other complications: Secondary | ICD-10-CM

## 2014-06-06 DIAGNOSIS — I480 Paroxysmal atrial fibrillation: Secondary | ICD-10-CM

## 2014-06-06 DIAGNOSIS — I10 Essential (primary) hypertension: Secondary | ICD-10-CM

## 2014-06-06 NOTE — Patient Instructions (Signed)
Your physician wants you to follow-up in: 1 year. You will receive a reminder letter in the mail two months in advance. If you don't receive a letter, please call our office to schedule the follow-up appointment.  

## 2014-06-16 ENCOUNTER — Encounter: Payer: Self-pay | Admitting: Internal Medicine

## 2014-06-16 NOTE — Progress Notes (Signed)
OFFICE NOTE  Chief Complaint:   routine follow-up  Primary Care Physician: Purvis Kilts, MD  HPI:  Joe Nigh Sr. is a 80 year old gentleman with history of paroxysmal A-fib, mild nonobstructive coronary disease, hypertension and dyslipidemia. He also has CPAP and has done well with that. He remains active although has had a few pounds of weight gain, really has no symptoms. We talked about stroke prevention with regard to paroxysmal atrial fibrillation which he has had in the past and he was on Coumadin. He has since switched to Eliquis 5 mg by mouth twice a day and is tolerating this very well.  He denies any bleeding complications. He has not had any recurrent atrial fibrillation and he is aware of.  Overall he denies any chest pain or worsening shortness of breath. He does report some lower leg swelling, which is improved with wearing stockings.  He has no new complaints today. We obtained recent laboratory work shows an excellent cholesterol profile. Total cholesterol is 124, triglycerides 88, HDL 57 LDL 49.  PMHx:  Past Medical History  Diagnosis Date  . AF (atrial fibrillation) 06/06/2010    2D Echo EF>55% normal Echo  . GERD (gastroesophageal reflux disease)   . Diverticulitis   . Chronic constipation   . HTN (hypertension)   . Hyperlipidemia   . Colon polyp 1/07    adenomatous  . OSA on CPAP 11/09/2007    sleep study REM 45mins AHI was 3.64hr at 7cm  . SOB (shortness of breath) 05/30/2010    stress test normal stress test EF63%    Past Surgical History  Procedure Laterality Date  . Knee surgery      right-arthroscopy  . Hemorrhoid surgery    . Rotator cuff repair      right  . Av fistula repair    . Cholecystectomy      Dr [name] -[facility]  . Cataract extraction w/phaco  07/28/2012    Procedure: CATARACT EXTRACTION PHACO AND INTRAOCULAR LENS PLACEMENT (IOC);  Surgeon: [name], MD;  Location: [facility];  Service: Ophthalmology;  Laterality:  Right;  CDE=10.80  . Hernia repair    . Cardiac catheterization  02/06/2007    FAMHx:  History reviewed. No pertinent family history.  SOCHx:   reports that he quit smoking about 36 years ago. His smoking use included Cigarettes. He has a 1.5 pack-year smoking history. He has never used smokeless tobacco. He reports that he drinks about 4.8 ounces of alcohol per week. He reports that he does not use illicit drugs.  ALLERGIES:  Allergies  Allergen Reactions  . Penicillins Hives and Itching    ROS: A comprehensive review of systems was negative except for: Cardiovascular: positive for irregular heart beat and lower extremity edema  HOME MEDS: Current Outpatient Prescriptions  Medication Sig Dispense Refill  . Alpha-D-Galactosidase (BEANO PO) Take 1 tablet by mouth daily as needed. For gas      . aspirin EC 81 MG tablet Take 81 mg by mouth daily.      Marland Kitchen diltiazem (CARDIZEM CD) 180 MG 24 hr capsule Take 1 capsule (180 mg total) by mouth daily.  90 capsule  2  . doxycycline (VIBRA-TABS) 100 MG tablet Take 100 mg by mouth 2 (two) times daily as needed. For breakout      . ELIQUIS 5 MG TABS tablet TAKE 1 TABLET BY MOUTH 2 TIMES DAILY.  60 tablet  5  . fish oil-omega-3 fatty acids 1000 MG capsule Take  1 g by mouth daily.       Marland Kitchen GARLIC PO Take 1 capsule by mouth daily.       . halobetasol (ULTRAVATE) 0.05 % cream Apply 1 application topically as needed. For rash      . metoprolol (LOPRESSOR) 50 MG tablet Take 1 tablet (50 mg total) by mouth 2 (two) times daily.  180 tablet  2  . Multiple Vitamin (MULTIVITAMIN WITH MINERALS) TABS Take 1 tablet by mouth daily.      . naproxen sodium (ANAPROX) 220 MG tablet Take 220 mg by mouth every 8 (eight) hours as needed. For pain      . simvastatin (ZOCOR) 20 MG tablet Take 1 tablet (20 mg total) by mouth at bedtime.  90 tablet  2   No current facility-administered medications for this visit.    LABS/IMAGING: No results found for this or any  previous visit (from the past 48 hour(s)). No results found.  VITALS: BP 140/80  Pulse 71  Ht 5' 10.5" (1.791 m)  Wt 220 lb 8 oz (100.018 kg)  BMI 31.18 kg/m2  EXAM: General appearance: alert and no distress Neck: no adenopathy, no carotid bruit, no JVD, supple, symmetrical, trachea midline and thyroid not enlarged, symmetric, no tenderness/mass/nodules Lungs: clear to auscultation bilaterally Heart: regular rate and rhythm, S1, S2 normal, no murmur, click, rub or gallop Abdomen: soft, non-tender; bowel sounds normal; no masses,  no organomegaly Extremities: extremities normal, atraumatic, no cyanosis or edema and There is a lipoma over the left scapula Pulses: 2+ and symmetric Skin: Skin color, texture, turgor normal. No rashes or lesions Neurologic: Grossly normal  EKG: Sinus rhythm at 71  ASSESSMENT: 1. Paroxysmal atrial fibrillation-now in sinus on Eliquis 2. Hypertension 3. Dyslipidemia - at goal 4. Obstructive sleep apnea on CPAP 5. Chronic venous insufficiency with edema  PLAN: 1.   Overall Mr. Bondar is doing well. Blood pressure is well controlled. His cholesterol is at goal. He is maintaining sinus rhythm on Eliquis. He is tolerating his CPAP. There are no new issues. I would recommend continuing his current medications we'll plan to see him back in one year.  [name], MD, [facility] Attending Cardiologist [facility] 06/16/2014, 5:16 PM

## 2014-07-25 ENCOUNTER — Other Ambulatory Visit: Payer: Self-pay | Admitting: *Deleted

## 2014-07-25 MED ORDER — DILTIAZEM HCL ER COATED BEADS 180 MG PO CP24: 180.0000 mg | ORAL_CAPSULE | Freq: Every day | ORAL | Status: DC

## 2014-07-25 MED ORDER — SIMVASTATIN 20 MG PO TABS: 20.0000 mg | ORAL_TABLET | Freq: Every day | ORAL | Status: DC

## 2014-07-25 NOTE — Telephone Encounter (Signed)
Rx refill sent to patient pharmacy   

## 2014-07-27 ENCOUNTER — Other Ambulatory Visit: Payer: Self-pay | Admitting: Internal Medicine

## 2014-07-27 NOTE — Telephone Encounter (Signed)
Rx was sent to pharmacy electronically. 

## 2014-11-22 ENCOUNTER — Other Ambulatory Visit: Payer: Self-pay | Admitting: Internal Medicine

## 2015-04-24 ENCOUNTER — Other Ambulatory Visit: Payer: Self-pay | Admitting: Internal Medicine

## 2015-04-24 NOTE — Telephone Encounter (Signed)
Rx(s) sent to pharmacy electronically.  

## 2015-07-03 ENCOUNTER — Other Ambulatory Visit: Payer: Self-pay | Admitting: *Deleted

## 2015-07-03 ENCOUNTER — Telehealth: Payer: Self-pay | Admitting: Internal Medicine

## 2015-07-03 MED ORDER — SIMVASTATIN 20 MG PO TABS: 20.0000 mg | ORAL_TABLET | Freq: Every day | ORAL | Status: DC

## 2015-07-03 MED ORDER — METOPROLOL TARTRATE 50 MG PO TABS: 50.0000 mg | ORAL_TABLET | Freq: Two times a day (BID) | ORAL | Status: DC

## 2015-07-03 MED ORDER — DILTIAZEM HCL ER COATED BEADS 180 MG PO CP24: 180.0000 mg | ORAL_CAPSULE | Freq: Every day | ORAL | Status: DC

## 2015-07-03 MED ORDER — ELIQUIS 5 MG PO TABS: 5.0000 mg | ORAL_TABLET | Freq: Two times a day (BID) | ORAL | Status: DC

## 2015-07-03 NOTE — Telephone Encounter (Signed)
°  1. Which medications need to be refilled? Simvastatin, Metoprolol, Eliquis, Diltizem   2. Which pharmacy is medication to be sent to?[facility]   3. Do they need a 30 day or 90 day supply? 90  4. Would they like a call back once the medication has been sent to the pharmacy? Yes

## 2015-07-20 ENCOUNTER — Inpatient Hospital Stay (HOSPITAL_COMMUNITY)
Admission: EM | Admit: 2015-07-20 | Discharge: 2015-07-23 | DRG: 378 | Disposition: A | Discharge status: Home / Self Care | Payer: 59 | Attending: Internal Medicine | Admitting: Internal Medicine

## 2015-07-20 ENCOUNTER — Encounter (HOSPITAL_COMMUNITY): Payer: Self-pay | Admitting: *Deleted

## 2015-07-20 DIAGNOSIS — K5731 Diverticulosis of large intestine without perforation or abscess with bleeding: Secondary | ICD-10-CM | POA: Diagnosis present

## 2015-07-20 DIAGNOSIS — Z7902 Long term (current) use of antithrombotics/antiplatelets: Secondary | ICD-10-CM

## 2015-07-20 DIAGNOSIS — D124 Benign neoplasm of descending colon: Secondary | ICD-10-CM | POA: Diagnosis present

## 2015-07-20 DIAGNOSIS — Z9119 Patient's noncompliance with other medical treatment and regimen: Secondary | ICD-10-CM | POA: Diagnosis present

## 2015-07-20 DIAGNOSIS — G4733 Obstructive sleep apnea (adult) (pediatric): Secondary | ICD-10-CM | POA: Diagnosis present

## 2015-07-20 DIAGNOSIS — R739 Hyperglycemia, unspecified: Secondary | ICD-10-CM | POA: Diagnosis present

## 2015-07-20 DIAGNOSIS — Z7901 Long term (current) use of anticoagulants: Secondary | ICD-10-CM

## 2015-07-20 DIAGNOSIS — E785 Hyperlipidemia, unspecified: Secondary | ICD-10-CM | POA: Diagnosis present

## 2015-07-20 DIAGNOSIS — R55 Syncope and collapse: Secondary | ICD-10-CM | POA: Diagnosis present

## 2015-07-20 DIAGNOSIS — Z7982 Long term (current) use of aspirin: Secondary | ICD-10-CM

## 2015-07-20 DIAGNOSIS — D689 Coagulation defect, unspecified: Secondary | ICD-10-CM | POA: Diagnosis not present

## 2015-07-20 DIAGNOSIS — Z880 Allergy status to penicillin: Secondary | ICD-10-CM

## 2015-07-20 DIAGNOSIS — K921 Melena: Secondary | ICD-10-CM | POA: Diagnosis present

## 2015-07-20 DIAGNOSIS — D62 Acute posthemorrhagic anemia: Secondary | ICD-10-CM | POA: Diagnosis present

## 2015-07-20 DIAGNOSIS — I480 Paroxysmal atrial fibrillation: Secondary | ICD-10-CM | POA: Diagnosis not present

## 2015-07-20 DIAGNOSIS — Z87891 Personal history of nicotine dependence: Secondary | ICD-10-CM | POA: Diagnosis not present

## 2015-07-20 DIAGNOSIS — I10 Essential (primary) hypertension: Secondary | ICD-10-CM | POA: Diagnosis present

## 2015-07-20 DIAGNOSIS — Z86010 Personal history of colonic polyps: Secondary | ICD-10-CM | POA: Diagnosis not present

## 2015-07-20 DIAGNOSIS — N179 Acute kidney failure, unspecified: Secondary | ICD-10-CM | POA: Diagnosis present

## 2015-07-20 DIAGNOSIS — K219 Gastro-esophageal reflux disease without esophagitis: Secondary | ICD-10-CM | POA: Diagnosis present

## 2015-07-20 DIAGNOSIS — K922 Gastrointestinal hemorrhage, unspecified: Secondary | ICD-10-CM | POA: Diagnosis present

## 2015-07-20 LAB — CBC
HEMATOCRIT: 34.4 % — AB (ref 39.0–52.0)
HEMOGLOBIN: 11.5 g/dL — AB (ref 13.0–17.0)
MCH: 29.7 pg (ref 26.0–34.0)
MCHC: 33.4 g/dL (ref 30.0–36.0)
MCV: 88.9 fL (ref 78.0–100.0)
Platelets: 183 10*3/uL (ref 150–400)
RBC: 3.87 MIL/uL — AB (ref 4.22–5.81)
RDW: 14.5 % (ref 11.5–15.5)
WBC: 5.9 10*3/uL (ref 4.0–10.5)

## 2015-07-20 LAB — COMPREHENSIVE METABOLIC PANEL
ALT: 21 U/L (ref 17–63)
AST: 26 U/L (ref 15–41)
Albumin: 3.4 g/dL — ABNORMAL LOW (ref 3.5–5.0)
Alkaline Phosphatase: 57 U/L (ref 38–126)
Anion gap: 6 (ref 5–15)
BILIRUBIN TOTAL: 0.7 mg/dL (ref 0.3–1.2)
BUN: 23 mg/dL — ABNORMAL HIGH (ref 6–20)
CALCIUM: 9.8 mg/dL (ref 8.9–10.3)
CHLORIDE: 106 mmol/L (ref 101–111)
CO2: 28 mmol/L (ref 22–32)
CREATININE: 1.37 mg/dL — AB (ref 0.61–1.24)
GFR calc Af Amer: 55 mL/min — ABNORMAL LOW (ref >60)
GFR calc non Af Amer: 47 mL/min — ABNORMAL LOW (ref >60)
Glucose, Bld: 129 mg/dL — ABNORMAL HIGH (ref 65–99)
Potassium: 4.3 mmol/L (ref 3.5–5.1)
Sodium: 140 mmol/L (ref 135–145)
Total Protein: 6.5 g/dL (ref 6.5–8.1)

## 2015-07-20 LAB — HEMOGLOBIN AND HEMATOCRIT, BLOOD
HCT: 32.3 % — ABNORMAL LOW (ref 39.0–52.0)
HEMOGLOBIN: 10.9 g/dL — AB (ref 13.0–17.0)

## 2015-07-20 LAB — ABO/RH: ABO/RH(D): O POS

## 2015-07-20 LAB — TYPE AND SCREEN
ABO/RH(D): O POS
Antibody Screen: NEGATIVE

## 2015-07-20 LAB — GLUCOSE, CAPILLARY: Glucose-Capillary: 91 mg/dL (ref 65–99)

## 2015-07-20 LAB — POC OCCULT BLOOD, ED: Fecal Occult Bld: POSITIVE — AB

## 2015-07-20 LAB — PROTIME-INR
INR: 1.33 (ref 0.00–1.49)
Prothrombin Time: 16.6 seconds — ABNORMAL HIGH (ref 11.6–15.2)

## 2015-07-20 LAB — APTT: aPTT: 34 seconds (ref 24–37)

## 2015-07-20 LAB — TROPONIN I: Troponin I: <0.03 ng/mL (ref <0.031)

## 2015-07-20 LAB — MRSA PCR SCREENING: MRSA by PCR: NEGATIVE

## 2015-07-20 MED ORDER — SODIUM CHLORIDE 0.9 % IV BOLUS (SEPSIS)
1000.0000 mL | Freq: Once | INTRAVENOUS | Status: AC
  Administered 2015-07-20: 1000 mL via INTRAVENOUS

## 2015-07-20 MED ORDER — SODIUM CHLORIDE 0.45 % IV SOLN
INTRAVENOUS | Status: DC
Start: 2015-07-20 — End: 2015-07-22
  Administered 2015-07-21 – 2015-07-22 (×3): via INTRAVENOUS

## 2015-07-20 MED ORDER — SODIUM CHLORIDE 0.9 % IV SOLN: 250.0000 mL | INTRAVENOUS | Status: DC | PRN

## 2015-07-20 MED ORDER — PANTOPRAZOLE SODIUM 40 MG IV SOLR
40.0000 mg | INTRAVENOUS | Status: AC
  Administered 2015-07-20: 40 mg via INTRAVENOUS
  Filled 2015-07-20: qty 40

## 2015-07-20 NOTE — ED Notes (Signed)
Called for triage.  Could not find pt in waiting room.

## 2015-07-20 NOTE — ED Notes (Signed)
Pt reports taking a laxitive and "something to clean out my bowels" pt states that he has had large amounts of bright red blood with bm. Pt reports 3 stools today.

## 2015-07-20 NOTE — H&P (Signed)
PULMONARY / CRITICAL CARE MEDICINE   Name: Joe ALMAS Sr. MRN: [id number] DOB: 1934-04-28    ADMISSION DATE:  07/20/2015 CONSULTATION DATE:  07/20/2015  REFERRING MD :  EDP  CHIEF COMPLAINT:  GIB  INITIAL PRESENTATION: 81 year old male presented to [facility] ED with complaints of hematochezia. Had 5 episodes on day of admission and had a period of lightheadedness while in line at store. In ED he was hemodynamically stable with Hgb 11.5, however he continued to have large volume bloody stools. He is on eliquis for Afib He placed in ICU by EDP. PCCM will see for admission.   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 He has a PMH as below, which includes AF on Xarelto, GERD, Diverticulitis, HTN, and OSA on CPAP. He was hospitalized about 2 years ago with abdominal pain that was diagnosed as diverticulitis. He was given IV antibiotics for a very short period and was discharged to home. About one week prior to current admission he developed diarrhea for a few days and took kaopectate for this, however this resulted in severe constipation with associated lower abdominal pain, particularly RLQ. He took a laxative that was prescribed to his wife, he does not know what its called. He then developed diarrhea again, but because he was still having abdominal pain he went to health store "something like [employer]" and bought another laxative/cleanse pill. This caused him to have profound diarrhea, at which point he noted hematochezia. He describes this as a moderate amount of blood mixed in with liquid stool. He has had about 10 episodes of this so far today prior to my evaluation. He presented to ED 8/4 after having dizziness while in line at store which caused him to "fall out", fortunately he was able to grab onto railing and lower himself slowly so he subjectively sustained no  injuries. In ED he was hemodynamically stable  He was admitted to ICU by EDP. PCCM to admit. Currently he denies dizziness, chest pain, SOB, nausea vomiting. He complains of mild lower abdominal pain, worse in RLQ.   PAST MEDICAL HISTORY :   has a past medical history of AF (atrial fibrillation) (06/06/2010); GERD (gastroesophageal reflux disease); Diverticulitis; Chronic constipation; HTN (hypertension); Hyperlipidemia; Colon polyp (1/07); OSA on CPAP (11/09/2007); and SOB (shortness of breath) (05/30/2010).  has past surgical history that includes Knee surgery; Hemorrhoid surgery; Rotator cuff repair; AV fistula repair; Cholecystectomy; Cataract extraction w/PHACO (07/28/2012); Hernia repair; and Cardiac catheterization (02/06/2007). Prior to Admission medications   Medication Sig Start Date End Date Taking? Authorizing Provider  Alpha-D-Galactosidase (BEANO PO) Take 1 tablet by mouth daily as needed. For gas   Yes Historical Provider, MD  aspirin EC 81 MG tablet Take 81 mg by mouth daily.   Yes Historical Provider, MD  diltiazem (CARTIA XT) 180 MG 24 hr capsule Take 1 capsule (180 mg total) by mouth daily. Patient needs to call our office to schedule an appointment for future refills. 07/03/15  Yes [name], MD  ELIQUIS 5 MG TABS tablet Take 1 tablet (5 mg total) by mouth 2 (two) times daily. 07/03/15  Yes [name], MD  fish oil-omega-3 fatty acids 1000 MG capsule Take 1 g by mouth daily.    Yes Historical Provider, MD  GARLIC PO Take 1 capsule by mouth daily.    Yes Historical  Provider, MD  metoprolol (LOPRESSOR) 50 MG tablet Take 1 tablet (50 mg total) by mouth 2 (two) times daily. 07/03/15  Yes [name], MD  Multiple Vitamin (MULTIVITAMIN WITH MINERALS) TABS Take 1 tablet by mouth daily.   Yes Historical Provider, MD  naproxen sodium (ANAPROX) 220 MG tablet Take 220 mg by mouth every 8 (eight) hours as needed. For pain   Yes Historical Provider, MD  simvastatin (ZOCOR) 20 MG  tablet Take 1 tablet (20 mg total) by mouth at bedtime. Must keep next appointment for future refills. 07/03/15  Yes [name], MD   Allergies  Allergen Reactions  . Penicillins Hives and Itching    FAMILY HISTORY:  indicated that his mother is deceased. He indicated that his father is deceased.  SOCIAL HISTORY:  reports that he quit smoking about 38 years ago. His smoking use included Cigarettes. He has a 1.5 pack-year smoking history. He has never used smokeless tobacco. He reports that he drinks about 4.8 oz of alcohol per week. He reports that he does not use illicit drugs.  REVIEW OF SYSTEMS:   Bolds are positive  Constitutional: weight loss, gain, night sweats, Fevers, chills, fatigue .  HEENT: headaches, Sore throat, sneezing, nasal congestion, post nasal drip, Difficulty swallowing, Tooth/dental problems, visual complaints visual changes, ear ache CV:  chest pain, radiates: ,Orthopnea, PND, swelling in lower extremities, dizziness, palpitations, syncope.  GI  heartburn, indigestion, abdominal pain, nausea, vomiting, diarrhea, change in bowel habits, loss of appetite, bloody stools.  Resp: cough, productive: , hemoptysis, dyspnea, chest pain, pleuritic.  Skin: rash or itching or icterus GU: dysuria, change in color of urine, urgency or frequency. flank pain, hematuria  MS: joint pain or swelling. decreased range of motion. Psych: change in mood or affect. depression or anxiety.  Neuro: difficulty with speech, weakness, numbness, ataxia    SUBJECTIVE: no pain  VITAL SIGNS: Temp:  [97.3 F (36.3 C)-98.5 F (36.9 C)] 98.5 F (36.9 C) (08/04 1948) Pulse Rate:  [70-107] 100 (08/04 2030) Resp:  [14-24] 23 (08/04 2030) BP: (106-140)/(65-83) 137/65 mmHg (08/04 2030) SpO2:  [94 %-100 %] 100 % (08/04 2030) Weight:  [98.6 kg (217 lb 6 oz)] 98.6 kg (217 lb 6 oz) (08/04 2030) HEMODYNAMICS:   VENTILATOR SETTINGS:   INTAKE / OUTPUT: No intake or output data in the 24 hours  ending 07/20/15 2116  PHYSICAL EXAMINATION: General:  Pleasant male in NAD Neuro:  Alert, oriented, non-focal. Talking on cell phone HEENT:  [place]/AT, no JVD, PERRL Cardiovascular:  RRR, no MRG. No edema Lungs:  Clear bilateral breath sounds Abdomen:  Soft, non-tender, non-distended Musculoskeletal: No acute deformity or ROM limitation Skin:  Grossly intact  LABS:  CBC  Recent Labs Lab 07/20/15 1604  WBC 5.9  HGB 11.5*  HCT 34.4*  PLT 183   Coag's  Recent Labs Lab 07/20/15 1604  APTT 34  INR 1.33   BMET  Recent Labs Lab 07/20/15 1604  NA 140  K 4.3  CL 106  CO2 28  BUN 23*  CREATININE 1.37*  GLUCOSE 129*   Electrolytes  Recent Labs Lab 07/20/15 1604  CALCIUM 9.8   Sepsis Markers No results for input(s): LATICACIDVEN, PROCALCITON, O2SATVEN in the last 168 hours. ABG No results for input(s): PHART, PCO2ART, PO2ART in the last 168 hours. Liver Enzymes  Recent Labs Lab 07/20/15 1604  AST 26  ALT 21  ALKPHOS 57  BILITOT 0.7  ALBUMIN 3.4*   Cardiac Enzymes  Recent Labs Lab  07/20/15 1714  TROPONINI <0.03   Glucose  Recent Labs Lab 07/20/15 2026  GLUCAP 91    Imaging No results found.   ASSESSMENT / PLAN:  PULMONARY A:  OSA on CPAP  P:   Supplemental O2 as needed to maintain SpO2 > 92% CPAP QHS  CARDIOVASCULAR A:  PAF on Eliquis > currently sinus H/o HTN, HLD  P:  Tele Hold asa, eliquis Hold metoprolol, diltiazem until bleeding controlled Hold Zocor while NPO  RENAL A:   AKI  P:   Hydrate Follow Bmet  GASTROINTESTINAL A:   Acute GI bleeding Diarrhea Recent constipation H/o Diverticulitis   P:   NPO Please consult GI in am Protonix BID  HEMATOLOGIC A:   Acute blood loss anemia in setting GI bleed Suspect hemodilution effect contributing to anemia as well Coagulopathy due to Xarelto  P:  Hold xarelto (last dose was 8/3 AM) Follow CBC Transfuse per ICU guidelines, doubt will need  this  INFECTIOUS A:   ? Diverticulitis, however doubt, no fevers or leukocytosis.   P:   Monitor off ABX for now Trend WBC and fever curve Consider CT abdomen if pain worsens or WBC/temp rise  ENDOCRINE A:   Hyperglycemia without DM history  P:   Monitor glucose on bmet  NEUROLOGIC A:   No acute issues  P:   RASS goal 0   FAMILY  - Updates: Long discussion with patient in ICU 8/4  - Inter-disciplinary family meet or Palliative Care meeting due by: 8/12   PCCM will manage tonight. Spoke with [facility] MD who will assume care in the 8/5 AM  Georgann Housekeeper, AGACNP-BC [facility] Pulmonology/Critical Care Pager [phone] or [phone]  07/20/2015 10:14 PM    STAFF NOTE: Linwood Dibbles, MD FACP have personally reviewed patient's available data, including medical history, events of note, physical examination and test results as part of my evaluation. I have discussed with resident/NP and other care providers such as pharmacist, RN and RRT. In addition, I personally evaluated patient and elicited key findings of: no distress, no abdo pain, colono in 2010, eliquis held, no further bleeding noted, continue hct q4h, sdu ok, to triad, Gi consult needed, likely lower source, keep on monitor, avoid gross overload, ppi bid for now  Lavon Paganini. Titus Mould, MD, [place] Pgr: [facility] 07/21/2015 6:57 AM

## 2015-07-20 NOTE — ED Notes (Signed)
Pt reports a syncopal episode while standing outside today. Pt states that he felt "woozy and passed out". Pt denies hitting his head. Pt also reports bright red rectal bleeding for several days as well.

## 2015-07-20 NOTE — ED Provider Notes (Signed)
CSN: [id number]     Arrival date & time 07/20/15  1535 History   First MD Initiated Contact with Patient 07/20/15 1632     Chief Complaint  Patient presents with  . Loss of Consciousness  . Rectal Bleeding     (Consider location/radiation/quality/duration/timing/severity/associated sxs/prior Treatment) HPI Comments: MA 81-year-old male with extensive past medical history including atrial fibrillation on Eliquis, hypertension, diverticulitis, OSA, colon polyps who presents with rectal bleeding. The patient reports that last week, he had 4 days of nonbloody diarrhea. He took over-the-counter diarrhea medications and then became constipated. 6 days ago, he took a laxative and later bought something from the health store to "clean out my bowels" which she took 2 days ago. 2 days ago he began having diarrhea containing bright red blood. This has persisted since it began and today he's had greater than 5 episodes of bloody diarrhea. At 3:15 today, he was standing waiting in line for food and he came lightheaded. He reports that he "went down" but he recalls the entire event. No chest pain or shortness of breath. He had lower abdominal pain in a bandlike distribution across his lower abdomen earlier but this has subsided. No fevers or vomiting.  Patient is a 81 y.o. male presenting with syncope and hematochezia. The history is provided by the patient.  Loss of Consciousness Rectal Bleeding   Past Medical History  Diagnosis Date  . AF (atrial fibrillation) 06/06/2010    2D Echo EF>55% normal Echo  . GERD (gastroesophageal reflux disease)   . Diverticulitis   . Chronic constipation   . HTN (hypertension)   . Hyperlipidemia   . Colon polyp 1/07    adenomatous  . OSA on CPAP 11/09/2007    sleep study REM 37mins AHI was 3.64hr at 7cm  . SOB (shortness of breath) 05/30/2010    stress test normal stress test EF63%   Past Surgical History  Procedure Laterality Date  . Knee surgery     right-arthroscopy  . Hemorrhoid surgery    . Rotator cuff repair      right  . Av fistula repair    . Cholecystectomy      Dr [name] -[facility]  . Cataract extraction w/phaco  07/28/2012    Procedure: CATARACT EXTRACTION PHACO AND INTRAOCULAR LENS PLACEMENT (IOC);  Surgeon: [name], MD;  Location: [facility];  Service: Ophthalmology;  Laterality: Right;  CDE=10.80  . Hernia repair    . Cardiac catheterization  02/06/2007   No family history on file. History  Substance Use Topics  . Smoking status: Former Smoker -- 0.25 packs/day for 6 years    Types: Cigarettes    Quit date: 07/22/1977  . Smokeless tobacco: Never Used  . Alcohol Use: 4.8 oz/week    1 Cans of beer, 7 Glasses of wine per week     Comment: 1 every 3-4 months, red wine    Review of Systems  Cardiovascular: Positive for syncope.  Gastrointestinal: Positive for hematochezia.    10 Systems reviewed and are negative for acute change except as noted in the HPI.   Allergies  Penicillins  Home Medications   Prior to Admission medications   Medication Sig Start Date End Date Taking? Authorizing Provider  Alpha-D-Galactosidase (BEANO PO) Take 1 tablet by mouth daily as needed. For gas   Yes Historical Provider, MD  aspirin EC 81 MG tablet Take 81 mg by mouth daily.   Yes Historical Provider, MD  diltiazem (CARTIA XT) 180 MG 24  hr capsule Take 1 capsule (180 mg total) by mouth daily. Patient needs to call our office to schedule an appointment for future refills. 07/03/15  Yes [name], MD  ELIQUIS 5 MG TABS tablet Take 1 tablet (5 mg total) by mouth 2 (two) times daily. 07/03/15  Yes [name], MD  fish oil-omega-3 fatty acids 1000 MG capsule Take 1 g by mouth daily.    Yes Historical Provider, MD  GARLIC PO Take 1 capsule by mouth daily.    Yes Historical Provider, MD  metoprolol (LOPRESSOR) 50 MG tablet Take 1 tablet (50 mg total) by mouth 2 (two) times daily. 07/03/15  Yes [name], MD  Multiple  Vitamin (MULTIVITAMIN WITH MINERALS) TABS Take 1 tablet by mouth daily.   Yes Historical Provider, MD  naproxen sodium (ANAPROX) 220 MG tablet Take 220 mg by mouth every 8 (eight) hours as needed. For pain   Yes Historical Provider, MD  simvastatin (ZOCOR) 20 MG tablet Take 1 tablet (20 mg total) by mouth at bedtime. Must keep next appointment for future refills. 07/03/15  Yes [name], MD   BP 106/71 mmHg  Pulse 107  Temp(Src) 97.3 F (36.3 C) (Oral)  Resp 16  SpO2 98% Physical Exam  Constitutional: He is oriented to person, place, and time. He appears well-developed and well-nourished. No distress.  HENT:  Head: Normocephalic and atraumatic.  Moist mucous membranes  Eyes: Pupils are equal, round, and reactive to light.  Pale conjunctivae  Neck: Neck supple.  Cardiovascular: Normal heart sounds.   No murmur heard. tachycardic  Pulmonary/Chest: Effort normal and breath sounds normal.  Abdominal: Soft. He exhibits no distension. There is no tenderness.  Hyperactive BS  Genitourinary:  Gross blood on rectal exam  Musculoskeletal: He exhibits no edema.  Neurological: He is alert and oriented to person, place, and time.  Fluent speech  Skin: Skin is warm and dry.  Psychiatric: He has a normal mood and affect. Judgment normal.  Nursing note and vitals reviewed.   ED Course  Procedures (including critical care time) Labs Review Labs Reviewed  CBC - Abnormal; Notable for the following:    RBC 3.87 (*)    Hemoglobin 11.5 (*)    HCT 34.4 (*)    All other components within normal limits  COMPREHENSIVE METABOLIC PANEL  POC OCCULT BLOOD, ED  TYPE AND SCREEN    Imaging Review No results found.   EKG Interpretation   Date/Time:  Thursday July 20 2015 15:50:34 EDT Ventricular Rate:  107 PR Interval:  144 QRS Duration: 84 QT Interval:  344 QTC Calculation: 459 R Axis:   -6 Text Interpretation:  Sinus tachycardia Otherwise normal ECG Incomplete  right bundle  branch block No significant change was found Confirmed by  Helena Sardo MD, Story Vanvranken [phone]) on 07/20/2015 5:49:03 PM         MDM   Final diagnoses:  None   81 year old male with extensive past medical history including A fib on Eliquis presents with several days of bloody bowel movements and a presyncopal episode that occurred today while standing. At presentation, the patient was awake, alert, and in no acute distress. Vital signs notable for tachycardia at 107 and borderline blood pressure of 106/71. No focal abdominal tenderness on exam.Hyperactive bowel sounds. Gross blood on rectal exam. Placed 2 large-bore IVs and put patient on cardiac monitoring. Obtained EKG and above lab work including type and screen, coags, serial Hgb. I'm concerned about the patient's ongoing bleeding  on anticoagulation.   EKG without ischemic changes. Labs show hemoglobin 11.5 which is down from the patient's baseline. Creatinine 1.37, likely due to hypovolemia. Gave the patient an IV fluid bolus as he continues to have multiple episodes of diarrhea. The patient has had at least 4-5 bowel movements that are gross blood. Because he is on Eliquis and continues to be tachycardic, he will be admitted to the ICU for close monitoring. I spoke with the gastroenterologist on-call, who recommended supportive care and will add patient to consult list for evaluation.   Sharlett Iles, MD 07/21/15 [phone]

## 2015-07-21 ENCOUNTER — Encounter (HOSPITAL_COMMUNITY): Payer: Self-pay

## 2015-07-21 DIAGNOSIS — I480 Paroxysmal atrial fibrillation: Secondary | ICD-10-CM | POA: Insufficient documentation

## 2015-07-21 DIAGNOSIS — D689 Coagulation defect, unspecified: Secondary | ICD-10-CM | POA: Insufficient documentation

## 2015-07-21 LAB — CBC
HCT: 26.6 % — ABNORMAL LOW (ref 39.0–52.0)
HCT: 27.8 % — ABNORMAL LOW (ref 39.0–52.0)
HCT: 26.5 % — ABNORMAL LOW (ref 39.0–52.0)
HEMATOCRIT: 26.9 % — AB (ref 39.0–52.0)
HEMATOCRIT: 25.9 % — AB (ref 39.0–52.0)
HEMATOCRIT: 27.4 % — AB (ref 39.0–52.0)
HEMOGLOBIN: 8.8 g/dL — AB (ref 13.0–17.0)
HEMOGLOBIN: 9.2 g/dL — AB (ref 13.0–17.0)
Hemoglobin: 8.8 g/dL — ABNORMAL LOW (ref 13.0–17.0)
Hemoglobin: 8.8 g/dL — ABNORMAL LOW (ref 13.0–17.0)
Hemoglobin: 9.4 g/dL — ABNORMAL LOW (ref 13.0–17.0)
Hemoglobin: 8.7 g/dL — ABNORMAL LOW (ref 13.0–17.0)
MCH: 29 pg (ref 26.0–34.0)
MCH: 30.6 pg (ref 26.0–34.0)
MCH: 30.2 pg (ref 26.0–34.0)
MCH: 29.3 pg (ref 26.0–34.0)
MCH: 29.8 pg (ref 26.0–34.0)
MCH: 29 pg (ref 26.0–34.0)
MCHC: 32.7 g/dL (ref 30.0–36.0)
MCHC: 34 g/dL (ref 30.0–36.0)
MCHC: 33.6 g/dL (ref 30.0–36.0)
MCHC: 33.1 g/dL (ref 30.0–36.0)
MCHC: 33.8 g/dL (ref 30.0–36.0)
MCHC: 32.8 g/dL (ref 30.0–36.0)
MCV: 88.8 fL (ref 78.0–100.0)
MCV: 89.9 fL (ref 78.0–100.0)
MCV: 89.8 fL (ref 78.0–100.0)
MCV: 88.7 fL (ref 78.0–100.0)
MCV: 88.3 fL (ref 78.0–100.0)
MCV: 88.3 fL (ref 78.0–100.0)
PLATELETS: 140 10*3/uL — AB (ref 150–400)
PLATELETS: 150 10*3/uL (ref 150–400)
Platelets: 129 10*3/uL — ABNORMAL LOW (ref 150–400)
Platelets: 133 10*3/uL — ABNORMAL LOW (ref 150–400)
Platelets: 122 10*3/uL — ABNORMAL LOW (ref 150–400)
Platelets: 142 10*3/uL — ABNORMAL LOW (ref 150–400)
RBC: 3.03 MIL/uL — ABNORMAL LOW (ref 4.22–5.81)
RBC: 2.88 MIL/uL — AB (ref 4.22–5.81)
RBC: 3.05 MIL/uL — ABNORMAL LOW (ref 4.22–5.81)
RBC: 3 MIL/uL — AB (ref 4.22–5.81)
RBC: 3.15 MIL/uL — ABNORMAL LOW (ref 4.22–5.81)
RBC: 3 MIL/uL — AB (ref 4.22–5.81)
RDW: 14.5 % (ref 11.5–15.5)
RDW: 14.7 % (ref 11.5–15.5)
RDW: 14.6 % (ref 11.5–15.5)
RDW: 14.6 % (ref 11.5–15.5)
RDW: 14.5 % (ref 11.5–15.5)
RDW: 14.5 % (ref 11.5–15.5)
WBC: 6.2 10*3/uL (ref 4.0–10.5)
WBC: 6.2 10*3/uL (ref 4.0–10.5)
WBC: 5 10*3/uL (ref 4.0–10.5)
WBC: 6 10*3/uL (ref 4.0–10.5)
WBC: 5.8 10*3/uL (ref 4.0–10.5)
WBC: 6.3 10*3/uL (ref 4.0–10.5)

## 2015-07-21 LAB — BASIC METABOLIC PANEL
Anion gap: 6 (ref 5–15)
BUN: 17 mg/dL (ref 6–20)
CALCIUM: 8.7 mg/dL — AB (ref 8.9–10.3)
CO2: 26 mmol/L (ref 22–32)
Chloride: 108 mmol/L (ref 101–111)
Creatinine, Ser: 1.09 mg/dL (ref 0.61–1.24)
GLUCOSE: 102 mg/dL — AB (ref 65–99)
Potassium: 4 mmol/L (ref 3.5–5.1)
Sodium: 140 mmol/L (ref 135–145)

## 2015-07-21 LAB — MAGNESIUM: Magnesium: 1.7 mg/dL (ref 1.7–2.4)

## 2015-07-21 LAB — PHOSPHORUS: PHOSPHORUS: 2.8 mg/dL (ref 2.5–4.6)

## 2015-07-21 MED ORDER — PANTOPRAZOLE SODIUM 40 MG IV SOLR
40.0000 mg | Freq: Two times a day (BID) | INTRAVENOUS | Status: DC
  Administered 2015-07-21 (×2): 40 mg via INTRAVENOUS
  Filled 2015-07-21 (×4): qty 40

## 2015-07-21 MED ORDER — ONDANSETRON HCL 4 MG/2ML IJ SOLN: 4.0000 mg | Freq: Three times a day (TID) | INTRAMUSCULAR | Status: AC | PRN

## 2015-07-21 NOTE — Consult Note (Signed)
[facility] Gastroenterology Consultation Note  Referring Provider: Dr. Berle Mull [facility]) Primary Care Physician:  Purvis Kilts, MD Primary Gastroenterologist:  Dr. Sydell Axon [facility])  Reason for Consultation:  Hematochezia  HPI: Joe Lee. is a 81 y.o. male whom we've been asked to see for above reasons.  He was in static state of GI health until yesterday, when he had some abdominal cramps and some voluminous bright red hematochezia.  No abdominal pain.  No nausea, vomiting, hematemesis.  Some recent diarrhea.  No known weight loss.  On Eliquis for years for atrial fibrillation.  No prior episodes of hematochezia.  Colonoscopy 2006 showed pancolonic diverticulosis as well as adenomatous polyp, and he tells me he had another colonoscopy in 2010 which also showed polyps (I can't locate that report).  No bleeding this morning.  He feels somewhat weak and dizzy.   Past Medical History  Diagnosis Date  . AF (atrial fibrillation) 06/06/2010    2D Echo EF>55% normal Echo  . GERD (gastroesophageal reflux disease)   . Diverticulitis   . Chronic constipation   . HTN (hypertension)   . Hyperlipidemia   . Colon polyp 1/07    adenomatous  . OSA on CPAP 11/09/2007    sleep study REM 44mins AHI was 3.64hr at 7cm  . SOB (shortness of breath) 05/30/2010    stress test normal stress test EF63%    Past Surgical History  Procedure Laterality Date  . Knee surgery      right-arthroscopy  . Hemorrhoid surgery    . Rotator cuff repair      right  . Av fistula repair    . Cholecystectomy      Dr [name] -[facility]  . Cataract extraction w/phaco  07/28/2012    Procedure: CATARACT EXTRACTION PHACO AND INTRAOCULAR LENS PLACEMENT (IOC);  Surgeon: [name], MD;  Location: [facility];  Service: Ophthalmology;  Laterality: Right;  CDE=10.80  . Hernia repair    . Cardiac catheterization  02/06/2007    Prior to Admission medications   Medication Sig Start Date End Date Taking? Authorizing  Provider  Alpha-D-Galactosidase (BEANO PO) Take 1 tablet by mouth daily as needed. For gas   Yes Historical Provider, MD  aspirin EC 81 MG tablet Take 81 mg by mouth daily.   Yes Historical Provider, MD  diltiazem (CARTIA XT) 180 MG 24 hr capsule Take 1 capsule (180 mg total) by mouth daily. Patient needs to call our office to schedule an appointment for future refills. 07/03/15  Yes [name], MD  ELIQUIS 5 MG TABS tablet Take 1 tablet (5 mg total) by mouth 2 (two) times daily. 07/03/15  Yes [name], MD  fish oil-omega-3 fatty acids 1000 MG capsule Take 1 g by mouth daily.    Yes Historical Provider, MD  GARLIC PO Take 1 capsule by mouth daily.    Yes Historical Provider, MD  metoprolol (LOPRESSOR) 50 MG tablet Take 1 tablet (50 mg total) by mouth 2 (two) times daily. 07/03/15  Yes [name], MD  Multiple Vitamin (MULTIVITAMIN WITH MINERALS) TABS Take 1 tablet by mouth daily.   Yes Historical Provider, MD  naproxen sodium (ANAPROX) 220 MG tablet Take 220 mg by mouth every 8 (eight) hours as needed. For pain   Yes Historical Provider, MD  simvastatin (ZOCOR) 20 MG tablet Take 1 tablet (20 mg total) by mouth at bedtime. Must keep next appointment for future refills. 07/03/15  Yes [name], MD  Current Facility-Administered Medications  Medication Dose Route Frequency Provider Last Rate Last Dose  . 0.45 % sodium chloride infusion   Intravenous Continuous Corey Harold, NP 100 mL/hr at 07/21/15 1051    . 0.9 %  sodium chloride infusion  250 mL Intravenous PRN Corey Harold, NP      . ondansetron [facility]) injection 4 mg  4 mg Intravenous Q8H PRN Sharlett Iles, MD      . pantoprazole (PROTONIX) injection 40 mg  40 mg Intravenous Q12H Wilhelmina Mcardle, MD   40 mg at 07/21/15 1008    Allergies as of 07/20/2015 - Review Complete 07/20/2015  Allergen Reaction Noted  . Penicillins Hives and Itching     No family history on file.  History   Social History  .  Marital Status: Married    Spouse Name: N/A  . Number of Children: N/A  . Years of Education: N/A   Occupational History  . Not on file.   Social History Main Topics  . Smoking status: Former Smoker -- 0.25 packs/day for 6 years    Types: Cigarettes    Quit date: 07/22/1977  . Smokeless tobacco: Never Used  . Alcohol Use: 4.8 oz/week    7 Glasses of wine, 1 Cans of beer per week     Comment: 1 every 3-4 months, red wine  . Drug Use: No  . Sexual Activity: Yes    Birth Control/ Protection: None   Other Topics Concern  . Not on file   Social History Narrative    Review of Systems: Positive = bold Gen: Denies any fever, chills, rigors, night sweats, anorexia, fatigue, weakness, malaise, involuntary weight loss, and sleep disorder CV: Denies chest pain, angina, palpitations, presyncope, orthopnea, PND, peripheral edema, and claudication. Resp: Denies dyspnea, cough, sputum, wheezing, coughing up blood. GI: Described in detail in HPI.    GU : Denies urinary burning, blood in urine, urinary frequency, urinary hesitancy, nocturnal urination, and urinary incontinence. MS: Denies joint pain or swelling.  Denies muscle weakness, cramps, atrophy.  Derm: Denies rash, itching, oral ulcerations, hives, unhealing ulcers.  Psych: Denies depression, anxiety, memory loss, suicidal ideation, hallucinations,  and confusion. Heme: Denies bruising, bleeding, and enlarged lymph nodes. Neuro:  Denies any headaches, dizziness, paresthesias. Endo:  Denies any problems with DM, thyroid, adrenal function.  Physical Exam: Vital signs in last 24 hours: Temp:  [97 F (36.1 C)-98.5 F (36.9 C)] 98 F (36.7 C) (08/05 0819) Pulse Rate:  [70-107] 83 (08/05 1000) Resp:  [11-24] 17 (08/05 1000) BP: (104-143)/(53-100) 120/64 mmHg (08/05 1000) SpO2:  [94 %-100 %] 100 % (08/05 1000) Weight:  [98.6 kg (217 lb 6 oz)] 98.6 kg (217 lb 6 oz) (08/05 0340) Last BM Date: 07/20/15 General:   Alert,   Well-developed, well-nourished, pleasant and cooperative in NAD Head:  Normocephalic and atraumatic. Eyes:  Sclera clear, no icterus.   Conjunctiva pink. Ears:  Normal auditory acuity. Nose:  No deformity, discharge,  or lesions. Mouth:  No deformity or lesions.  Oropharynx pink & moist. Neck:  Supple; no masses or thyromegaly. Lungs:  Clear throughout to auscultation.   No wheezes, crackles, or rhonchi. No acute distress. Heart:  Irregularly irregular rhythm, normal rate; no murmurs, clicks, rubs,  or gallops. Abdomen:  Soft, nontender and nondistended. No masses, hepatosplenomegaly or hernias noted. Normal bowel sounds, without guarding, and without rebound.     Msk:  Symmetrical without gross deformities. Normal posture. Pulses:  Normal pulses noted. Extremities:  Without clubbing or edema. Neurologic:  Alert and  oriented x4;  grossly normal neurologically. Skin:  Intact without significant lesions or rashes. Psych:  Alert and cooperative. Normal mood and affect.   Lab Results:  Recent Labs  07/20/15 1604 07/20/15 2112 07/21/15 0219 07/21/15 0815  WBC 5.9  --  6.2 PENDING  HGB 11.5* 10.9* 8.8* 8.8*  HCT 34.4* 32.3* 26.9* 25.9*  PLT 183  --  129* 133*   BMET  Recent Labs  07/20/15 1604 07/21/15 0219  NA 140 140  K 4.3 4.0  CL 106 108  CO2 28 26  GLUCOSE 129* 102*  BUN 23* 17  CREATININE 1.37* 1.09  CALCIUM 9.8 8.7*   LFT  Recent Labs  07/20/15 1604  PROT 6.5  ALBUMIN 3.4*  AST 26  ALT 21  ALKPHOS 57  BILITOT 0.7   PT/INR  Recent Labs  07/20/15 1604  LABPROT 16.6*  INR 1.33    Studies/Results: No results found.  Impression:  1.  Painless hematochezia.  Suspect diverticular, enhanced by Eliquis.   No bleeding today. 2.  Acute blood loss anemia, likely from #1 above. 3.  Personal history of colonic polyps.  Plan:  1.  Supportive care; intravenous fluids, serial CBCs, transfuse if needed. 2.  Hold anticoagulant and antiplatelet  therapies. 3.  If overt rebleeding (and not just passage of old blood), would pursue tagged RBC study as next step in management. 4.  Given need to resume Eliquis at some point and ~ 5 years since his last colonoscopy and his personal history of colonic polyps, would consider repeat colonoscopy in a couple days (during this admission). 5.  [facility] will follow.   LOS: 1 day   Purvi Ruehl M  07/21/2015, 11:03 AM  Pager [phone] If no answer or after 5 PM call [phone]

## 2015-07-21 NOTE — Progress Notes (Signed)
Patient has refused CPAP for tonight. Patient states he has not worn his CPAP in months. RT will continue to monitor as needed.

## 2015-07-21 NOTE — Progress Notes (Signed)
Spoke to [employer], NP about drop in hemoglobin from 10.9 to 8.8 in about a 4-5 hour time span from the previous CBC. Pt has only had 1 small bloody BM and that was earlier during the shift at about 2100. New order for repeat CBC placed. Will continue to monitor.

## 2015-07-22 DIAGNOSIS — I480 Paroxysmal atrial fibrillation: Secondary | ICD-10-CM

## 2015-07-22 DIAGNOSIS — K921 Melena: Secondary | ICD-10-CM

## 2015-07-22 DIAGNOSIS — D689 Coagulation defect, unspecified: Secondary | ICD-10-CM

## 2015-07-22 LAB — CBC
HCT: 28.4 % — ABNORMAL LOW (ref 39.0–52.0)
Hemoglobin: 9.4 g/dL — ABNORMAL LOW (ref 13.0–17.0)
MCH: 29.8 pg (ref 26.0–34.0)
MCHC: 33.1 g/dL (ref 30.0–36.0)
MCV: 90.2 fL (ref 78.0–100.0)
PLATELETS: 182 10*3/uL (ref 150–400)
RBC: 3.15 MIL/uL — ABNORMAL LOW (ref 4.22–5.81)
RDW: 14.6 % (ref 11.5–15.5)
WBC: 8.3 10*3/uL (ref 4.0–10.5)

## 2015-07-22 MED ORDER — PEG 3350-KCL-NA BICARB-NACL 420 G PO SOLR
4000.0000 mL | Freq: Once | ORAL | Status: AC
  Administered 2015-07-22: 4000 mL via ORAL
  Filled 2015-07-22: qty 4000

## 2015-07-22 MED ORDER — SIMETHICONE 40 MG/0.6ML PO SUSP
40.0000 mg | Freq: Four times a day (QID) | ORAL | Status: DC | PRN
  Administered 2015-07-22: 40 mg via ORAL
  Filled 2015-07-22 (×3): qty 0.6

## 2015-07-22 MED ORDER — METOPROLOL TARTRATE 50 MG PO TABS
50.0000 mg | ORAL_TABLET | Freq: Two times a day (BID) | ORAL | Status: DC
  Administered 2015-07-22 – 2015-07-23 (×3): 50 mg via ORAL
  Filled 2015-07-22 (×4): qty 1

## 2015-07-22 MED ORDER — SIMVASTATIN 20 MG PO TABS
20.0000 mg | ORAL_TABLET | Freq: Every day | ORAL | Status: DC
  Administered 2015-07-22: 20 mg via ORAL
  Filled 2015-07-22 (×2): qty 1

## 2015-07-22 MED ORDER — METOPROLOL TARTRATE 50 MG PO TABS: 50.0000 mg | ORAL_TABLET | Freq: Two times a day (BID) | ORAL | Status: DC

## 2015-07-22 MED ORDER — SODIUM CHLORIDE 0.9 % IV SOLN
INTRAVENOUS | Status: DC
  Administered 2015-07-22: 20 mL/h via INTRAVENOUS
  Administered 2015-07-23: 500 mL via INTRAVENOUS

## 2015-07-22 MED ORDER — OMEGA-3 FATTY ACIDS 1000 MG PO CAPS
1.0000 g | ORAL_CAPSULE | Freq: Every day | ORAL | Status: DC
  Administered 2015-07-22 – 2015-07-23 (×2): 1 g via ORAL
  Filled 2015-07-22 (×3): qty 1

## 2015-07-22 MED ORDER — ADULT MULTIVITAMIN W/MINERALS CH
1.0000 | ORAL_TABLET | Freq: Every day | ORAL | Status: DC
  Administered 2015-07-22 – 2015-07-23 (×2): 1 via ORAL
  Filled 2015-07-22 (×2): qty 1

## 2015-07-22 NOTE — Progress Notes (Signed)
Report giving to [facility] on 5W rm8, pt transported on monitor via wheel chair by [employer]

## 2015-07-22 NOTE — Progress Notes (Signed)
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 He has a hx of AF on Xarelto, GERD, Diverticulitis, HTN, and OSA on CPAP. He was hospitalized about 2 years ago with abdominal pain that was diagnosed as diverticulitis and tx w/ empiric antibiotics. About one week prior to current admission he developed diarrhea for a few days and took kaopectate.  This resulted in severe constipation with associated lower abdominal pain, particularly RLQ. He took a laxative and developed diarrhea again.  Because he was still having abdominal pain he bought another laxative/cleanse pill. This caused him to have profound diarrhea, at which point he noted hematochezia. He describes a moderate amount of blood mixed in with liquid stool. H had about 10 episodes. He presented to ED 8/4 after having dizziness while in line at store which caused him to "fall out", fortunately he was able to grab onto railing and lower himself slowly so he subjectively sustained no injuries. In ED he was hemodynamically stable    HPI/Subjective: The pt reports ongoing crampy abdom pains intermittently, along with some indigestion and moderate nausea.  He denies vomiting, further evident bleeding, cp, sob, or HA.    Assessment/Plan:  Acute GIB - Painless BRBPR Suspected diverticular bleeding - pancolonic diverticulosis noted on colo 2006 - GI considering colo during this admit - if signif bleeding recurs next step will be nuclear med bleeding scan, followed by possible embolization  Acute blood loss anemia Hgb fluctuating some but appears to be stable in general - follow trend   Diarrhea  Due to use of multiple laxatives - appears to have resolved at this time   Parox Afib on apixiban Rate controlled  but creeping up - anticoag on hold due to above - resume BB today and follow trend   Acute kidney injury  Due to [facility] related to diarrhea + blood loss - renal function normalizing - cont low dose IVF for now   Hyperglycemia No known hx of DM - check A1c  HTN BP well controlled  OSA Noncompliant w/ CPAP   Code Status: FULL Family Communication: no family present at time of exam Disposition Plan: stable for transfer to tele bed - follow Hgb - progress activity   Consultants: [facility]  Procedures: none  Antibiotics: none  DVT prophylaxis: SCDs  Objective: Blood pressure 126/73, pulse 95, temperature 99 F (37.2 C), temperature source Oral, resp. rate 32, height 5\' 10"  (1.778 m), weight 95.9 kg (211 lb 6.7 oz), SpO2 96 %.  Intake/Output Summary (Last 24 hours) at 07/22/15 0924 Last data filed at 07/22/15 0700  Gross per 24 hour  Intake   2200 ml  Output   2275 ml  Net    -75 ml   Exam: General: No acute respiratory distress Lungs: Clear to auscultation bilaterally without wheezes or crackles Cardiovascular: irreg irreg - rate ~100 - no appreciable M, G, or rub  Abdomen: Nontender, nondistended, soft, bowel sounds positive, no rebound, no ascites, no appreciable mass Extremities: No significant cyanosis, clubbing, or edema bilateral lower extremities  Data Reviewed: Basic Metabolic Panel:  Recent Labs Lab 07/20/15 1604 07/21/15 0219  NA 140 140  K 4.3 4.0  CL 106 108  CO2 28 26  GLUCOSE 129* 102*  BUN 23* 17  CREATININE 1.37* 1.09  CALCIUM 9.8 8.7*  MG  --  1.7  PHOS  --  2.8    CBC:  Recent Labs Lab 07/21/15 0815 07/21/15 1246 07/21/15 1420 07/21/15 1836 07/21/15 2147  WBC 6.2 5.0 6.0 5.8 6.3  HGB 8.8* 9.2* 8.8* 9.4* 8.7*  HCT 25.9* 27.4* 26.6* 27.8* 26.5*  MCV 89.9 89.8 88.7 88.3 88.3  PLT 133* 122* 140* 150 142*   Liver Function Tests:  Recent Labs Lab 07/20/15 1604  AST 26  ALT 21  ALKPHOS 57  BILITOT 0.7  PROT 6.5  ALBUMIN 3.4*    Coags:  Recent Labs Lab 07/20/15 1604  INR 1.33    Recent Labs Lab 07/20/15 1604  APTT 34    Cardiac Enzymes:  Recent Labs Lab 07/20/15 1714  TROPONINI <0.03    CBG:  Recent Labs Lab 07/20/15 2026  GLUCAP 91    Recent Results (from the past 240 hour(s))  MRSA PCR Screening     Status: None   Collection Time: 07/20/15  9:18 PM  Result Value Ref Range Status   MRSA by PCR NEGATIVE NEGATIVE Final    Comment:        The GeneXpert MRSA Assay (FDA approved for NASAL specimens only), is one component of a comprehensive MRSA colonization surveillance program. It is not intended to diagnose MRSA infection nor to guide or monitor treatment for MRSA infections.      Studies:   Recent x-ray studies have been reviewed in detail by the Attending Physician  Scheduled Meds:  Scheduled Meds: . fish oil-omega-3 fatty acids  1 g Oral Daily  . metoprolol  50 mg Oral BID  . multivitamin with minerals  1 tablet Oral Daily  . simvastatin  20 mg Oral QHS    Time spent on care of this patient: 35 mins   Christifer Chapdelaine T , MD   [facility] Office  [phone] Pager - Text Page per Shea Evans as per below:  On-Call/Text Page:      [email or web address]      password [email or web address]  If 7PM-7AM, please contact night-coverage [email or web address] Password [email or web address] 07/22/2015, 9:24 AM   LOS: 2 days

## 2015-07-22 NOTE — Progress Notes (Signed)
Pt complaining of lower abdominal pain that he stated is associated with gas pain. Pt reports that he has had increased gas pattern this am. Abdomen is soft to touch. Pt has had no stools since 8/4. At this time pt requesting something for the gas. MD called and made aware. New orders received. Will continue to monitor.

## 2015-07-22 NOTE — Progress Notes (Signed)
[facility] Progress Note  Subjective: The patient states he had one small slightly bloodstained stool this morning very little yesterday. Most of his bleeding was on Wednesday and Thursday  Objective: Vital signs in last 24 hours: Temp:  [97.9 F (36.6 C)-99 F (37.2 C)] 99 F (37.2 C) (08/06 0815) Pulse Rate:  [81-106] 96 (08/06 0800) Resp:  [13-32] 19 (08/06 0900) BP: (106-129)/(58-102) 114/102 mmHg (08/06 0900) SpO2:  [95 %-100 %] 96 % (08/06 0800) Weight:  [95.9 kg (211 lb 6.7 oz)] 95.9 kg (211 lb 6.7 oz) (08/06 0427) Weight change: -2.7 kg (-5 lb 15.2 oz)   PE: Alert oriented no acute distress. Abdomen slightly tender in the left lower quadrant  Lab Results: Results for orders placed or performed during the hospital encounter of 07/20/15 (from the past 24 hour(s))  CBC     Status: Abnormal   Collection Time: 07/21/15 12:46 PM  Result Value Ref Range   WBC 5.0 4.0 - 10.5 K/uL   RBC 3.05 (L) 4.22 - 5.81 MIL/uL   Hemoglobin 9.2 (L) 13.0 - 17.0 g/dL   HCT 27.4 (L) 39.0 - 52.0 %   MCV 89.8 78.0 - 100.0 fL   MCH 30.2 26.0 - 34.0 pg   MCHC 33.6 30.0 - 36.0 g/dL   RDW 14.6 11.5 - 15.5 %   Platelets 122 (L) 150 - 400 K/uL  CBC     Status: Abnormal   Collection Time: 07/21/15  2:20 PM  Result Value Ref Range   WBC 6.0 4.0 - 10.5 K/uL   RBC 3.00 (L) 4.22 - 5.81 MIL/uL   Hemoglobin 8.8 (L) 13.0 - 17.0 g/dL   HCT 26.6 (L) 39.0 - 52.0 %   MCV 88.7 78.0 - 100.0 fL   MCH 29.3 26.0 - 34.0 pg   MCHC 33.1 30.0 - 36.0 g/dL   RDW 14.6 11.5 - 15.5 %   Platelets 140 (L) 150 - 400 K/uL  CBC     Status: Abnormal   Collection Time: 07/21/15  6:36 PM  Result Value Ref Range   WBC 5.8 4.0 - 10.5 K/uL   RBC 3.15 (L) 4.22 - 5.81 MIL/uL   Hemoglobin 9.4 (L) 13.0 - 17.0 g/dL   HCT 27.8 (L) 39.0 - 52.0 %   MCV 88.3 78.0 - 100.0 fL   MCH 29.8 26.0 - 34.0 pg   MCHC 33.8 30.0 - 36.0 g/dL   RDW 14.5 11.5 - 15.5 %   Platelets 150 150 - 400 K/uL  CBC     Status: Abnormal   Collection Time: 07/21/15  9:47 PM  Result Value Ref Range   WBC 6.3 4.0 - 10.5 K/uL   RBC 3.00 (L) 4.22 - 5.81 MIL/uL   Hemoglobin 8.7 (L) 13.0 - 17.0 g/dL   HCT 26.5 (L) 39.0 - 52.0 %   MCV 88.3 78.0 - 100.0 fL   MCH 29.0 26.0 - 34.0 pg   MCHC 32.8 30.0 - 36.0 g/dL   RDW 14.5 11.5 - 15.5 %   Platelets 142 (L) 150 - 400 K/uL    Studies/Results: No results found.    Assessment: Lower GI bleeding on eloquis, last taken 2 days ago, last colonoscopy between 5 and 10 years ago Plan: Will set up colonoscopy in the next day or 2. Continue to hold anticoagulates.    Christen Wardrop C 07/22/2015, 10:05 AM  Pager [phone] If no answer or after 5 PM call [phone]

## 2015-07-23 ENCOUNTER — Encounter (HOSPITAL_COMMUNITY)
Admission: EM | Disposition: A | Discharge status: Home / Self Care | Payer: Self-pay | Source: Home / Self Care | Attending: Internal Medicine

## 2015-07-23 HISTORY — PX: COLONOSCOPY: SHX5424

## 2015-07-23 LAB — COMPREHENSIVE METABOLIC PANEL
ALT: 15 U/L — AB (ref 17–63)
AST: 26 U/L (ref 15–41)
Albumin: 2.9 g/dL — ABNORMAL LOW (ref 3.5–5.0)
Alkaline Phosphatase: 53 U/L (ref 38–126)
Anion gap: 9 (ref 5–15)
BUN: 9 mg/dL (ref 6–20)
CALCIUM: 9.1 mg/dL (ref 8.9–10.3)
CHLORIDE: 107 mmol/L (ref 101–111)
CO2: 24 mmol/L (ref 22–32)
Creatinine, Ser: 1.11 mg/dL (ref 0.61–1.24)
GLUCOSE: 100 mg/dL — AB (ref 65–99)
POTASSIUM: 3.9 mmol/L (ref 3.5–5.1)
Sodium: 140 mmol/L (ref 135–145)
TOTAL PROTEIN: 5.8 g/dL — AB (ref 6.5–8.1)
Total Bilirubin: 0.4 mg/dL (ref 0.3–1.2)

## 2015-07-23 LAB — CBC
HCT: 42.1 % (ref 39.0–52.0)
Hemoglobin: 13.9 g/dL (ref 13.0–17.0)
MCH: 29.2 pg (ref 26.0–34.0)
MCHC: 33 g/dL (ref 30.0–36.0)
MCV: 88.4 fL (ref 78.0–100.0)
Platelets: ADEQUATE 10*3/uL (ref 150–400)
RBC: 4.76 MIL/uL (ref 4.22–5.81)
RDW: 14.6 % (ref 11.5–15.5)
WBC: 4.6 10*3/uL (ref 4.0–10.5)

## 2015-07-23 SURGERY — COLONOSCOPY
Anesthesia: Moderate Sedation

## 2015-07-23 MED ORDER — MIDAZOLAM HCL 5 MG/5ML IJ SOLN
INTRAMUSCULAR | Status: DC | PRN
  Administered 2015-07-23: 2 mg via INTRAVENOUS
  Administered 2015-07-23: 1 mg via INTRAVENOUS

## 2015-07-23 MED ORDER — MIDAZOLAM HCL 5 MG/ML IJ SOLN
INTRAMUSCULAR | Status: AC
  Filled 2015-07-23: qty 1

## 2015-07-23 MED ORDER — ASPIRIN EC 81 MG PO TBEC: 81.0000 mg | DELAYED_RELEASE_TABLET | Freq: Every day | ORAL | Status: DC

## 2015-07-23 MED ORDER — FENTANYL CITRATE (PF) 100 MCG/2ML IJ SOLN
INTRAMUSCULAR | Status: AC
  Filled 2015-07-23: qty 2

## 2015-07-23 MED ORDER — ELIQUIS 5 MG PO TABS: 5.0000 mg | ORAL_TABLET | Freq: Two times a day (BID) | ORAL | Status: DC

## 2015-07-23 MED ORDER — DIPHENHYDRAMINE HCL 50 MG/ML IJ SOLN
INTRAMUSCULAR | Status: AC
  Filled 2015-07-23: qty 1

## 2015-07-23 MED ORDER — FENTANYL CITRATE (PF) 100 MCG/2ML IJ SOLN
INTRAMUSCULAR | Status: DC | PRN
  Administered 2015-07-23: 25 ug via INTRAVENOUS

## 2015-07-23 NOTE — Discharge Instructions (Signed)
Follow with Primary MD Purvis Kilts, MD in 7 days   Get CBC, CMP, 2 view Chest X ray checked  by Primary MD next visit.    Activity: As tolerated with Full fall precautions use walker/cane & assistance as needed   Disposition Home     Diet: Heart Healthy    For Heart failure patients - Check your Weight same time everyday, if you gain over 2 pounds, or you develop in leg swelling, experience more shortness of breath or chest pain, call your Primary MD immediately. Follow Cardiac Low Salt Diet and 1.5 lit/day fluid restriction.   On your next visit with your primary care physician please Get Medicines reviewed and adjusted.   Please request your Prim.MD to go over all Hospital Tests and Procedure/Radiological results at the follow up, please get all Hospital records sent to your Prim MD by signing hospital release before you go home.   If you experience worsening of your admission symptoms, develop shortness of breath, life threatening emergency, suicidal or homicidal thoughts you must seek medical attention immediately by calling 911 or calling your MD immediately  if symptoms less severe.  You Must read complete instructions/literature along with all the possible adverse reactions/side effects for all the Medicines you take and that have been prescribed to you. Take any new Medicines after you have completely understood and accpet all the possible adverse reactions/side effects.   Do not drive, operating heavy machinery, perform activities at heights, swimming or participation in water activities or provide baby sitting services if your were admitted for syncope or siezures until you have seen by Primary MD or a Neurologist and advised to do so again.  Do not drive when taking Pain medications.    Do not take more than prescribed Pain, Sleep and Anxiety Medications  Special Instructions: If you have smoked or chewed Tobacco  in the last 2 yrs please stop smoking, stop any  regular Alcohol  and or any Recreational drug use.  Wear Seat belts while driving.   Please note  You were cared for by a hospitalist during your hospital stay. If you have any questions about your discharge medications or the care you received while you were in the hospital after you are discharged, you can call the unit and asked to speak with the hospitalist on call if the hospitalist that took care of you is not available. Once you are discharged, your primary care physician will handle any further medical issues. Please note that NO REFILLS for any discharge medications will be authorized once you are discharged, as it is imperative that you return to your primary care physician (or establish a relationship with a primary care physician if you do not have one) for your aftercare needs so that they can reassess your need for medications and monitor your lab values.

## 2015-07-23 NOTE — Progress Notes (Signed)
Nsg Discharge Note  Admit Date:  07/20/2015 Discharge date: 07/23/2015   [name] to be D/C'd Home per MD order.  AVS completed.  Copy for chart, and copy for patient signed, and dated. Patient/caregiver able to verbalize understanding.  Discharge Medication:   Medication List    STOP taking these medications        naproxen sodium 220 MG tablet  Commonly known as:  ANAPROX      TAKE these medications        aspirin EC 81 MG tablet  Take 1 tablet (81 mg total) by mouth daily.  Start taking on:  07/26/2015     BEANO PO  Take 1 tablet by mouth daily as needed. For gas     diltiazem 180 MG 24 hr capsule  Commonly known as:  CARTIA XT  Take 1 capsule (180 mg total) by mouth daily. Patient needs to call our office to schedule an appointment for future refills.     ELIQUIS 5 MG Tabs tablet  Generic drug:  apixaban  Take 1 tablet (5 mg total) by mouth 2 (two) times daily.  Start taking on:  07/26/2015     fish oil-omega-3 fatty acids 1000 MG capsule  Take 1 g by mouth daily.     GARLIC PO  Take 1 capsule by mouth daily.     metoprolol 50 MG tablet  Commonly known as:  LOPRESSOR  Take 1 tablet (50 mg total) by mouth 2 (two) times daily.     multivitamin with minerals Tabs tablet  Take 1 tablet by mouth daily.     simvastatin 20 MG tablet  Commonly known as:  ZOCOR  Take 1 tablet (20 mg total) by mouth at bedtime. Must keep next appointment for future refills.        Discharge Assessment: Filed Vitals:   07/23/15 0916  BP: 114/62  Pulse:   Temp:   Resp: 21   Skin clean, dry and intact without evidence of skin break down, no evidence of skin tears noted. IV catheter discontinued intact. Site without signs and symptoms of complications - no redness or edema noted at insertion site, patient denies c/o pain - only slight tenderness at site.  Dressing with slight pressure applied.  D/c Instructions-Education: Discharge instructions given to patient/family  with verbalized understanding. D/c education completed with patient/family including follow up instructions, medication list, d/c activities limitations if indicated, with other d/c instructions as indicated by MD - patient able to verbalize understanding, all questions fully answered. Patient instructed to return to ED, call 911, or call MD for any changes in condition.  Patient escorted via [place], and D/C home via private auto.  Dayle Points, RN 07/23/2015 11:40 AM

## 2015-07-23 NOTE — Progress Notes (Signed)
Patient's colonoscopy findings discussed with Dr. Candiss Norse.  Please see dictated report for findings and recommendations.  Would consider discharging this patient on prophylactic PPI therapy; he indicates he was taking an 81 mg aspirin prior to admission, although this is not listed on his home medications so there is some ambiguity about this. Also, it is not clear to me if he needs aspirin, since he is on chronic anticoagulation. However, if he is going to be chronically on any ulcerogenic medication (beyond the occasional Naprosyn he uses), I think PPI prophylaxis would be appropriate because of his anticoagulated state.  We will sign off. Please call if questions.  Joe Lee, M.D. Pager [phone] If no answer or after 5 PM call [phone]

## 2015-07-23 NOTE — Op Note (Signed)
[facility] [place], [address]   COLONOSCOPY PROCEDURE REPORT  PATIENT: Joe Lee, Joe Lee  MR#: [id number] BIRTHDATE: October 21, 1934 , 67  yrs. old GENDER: male ENDOSCOPIST: [name], MD REFERRED BY:  [name], M.D. , Dr. Josem Kaufmann PROCEDURE DATE:  07-24-15 PROCEDURE:   colonoscopy with biopsy ASA CLASS:   III INDICATIONS:  large volume hematochezia with hemodynamic instability, now resolved, in a patient with a known history of extensive diverticulosis MEDICATIONS: fentanyl 25 g, Versed 3 mg IV  DESCRIPTION OF PROCEDURE:   The patient was brought from his hospital room to the [facility] cone endoscopy unit following a Nulytely prep..  After the risks and benefits and of the procedure were explained, informed consent was obtained.  digital exam showed anal stenosis with a fibrotic ring preventing insertion of more than the tip of my index finger, so I was unable to feel the prostate gland.         The Pentax Adult Colon [id number] colonoscope was introduced through the anus and advanced to the cecum, as confirmed by visualization of the ileal cecal valve and appendiceal orifice      .  The quality of the prep was excellent      .  The instrument was then slowly withdrawn as the colon was fully examined. Estimated blood loss is zero unless otherwise noted in this procedure report.   There was no blood anywhere in the colonic lumen.  The scope was advanced quite easily around the colon to the area just above the cecum, where upon some external abdominal compression was needed to get the tip of the scope gender the base of the cecum.  In the descending colon, there was a 3 mm sessile polyp removed by a single cold biopsy.  There was moderately severe left-sided diverticulosis and mild right-sided diverticulosis.  Otherwise, the exam was normal. No large polyps, cancer, colitis, or vascular malformations were  observed.  Retroflexion in the rectum showed a linear white scar, suggestive of possible previous hemorrhoidectomy operation, possibly accounting for his anal stenosis.     The scope was then withdrawn from the patient and the procedure completed.  WITHDRAWAL TIME: 8 minutes  COMPLICATIONS: There were no immediate complications.  ENDOSCOPIC IMPRESSION: 1. No active bleeding or blood in the colonic lumen at the time of this exam. 2. Predominantly left sided diverticulosis, also some right sided diverticulosis. This is the presumed source of this patient's recent large volume hematochezia. 3. Scarring in anal canal, possible postoperative change   RECOMMENDATIONS: 1. Okay for discharge from GI tract standpoint 2. The patient was on Eliquis at the time of admission, which presumably increase the severity of his GI bleed. I think that resumption of that medication in 2-3 days would be reasonably safe. 3. The patient apparently was on a daily aspirin, as well as occasional Naprosyn, prior to admission. Consideration should be given to ongoing PPI prophylaxis if he is going to remain on aspirin, to help prevent a future GI bleed. 4. Await pathology on the small polyp removed today. However, in view of the patient's advanced age, and coexisting medical conditions, it is not felt that surveillance colonoscopy would be necessary, even if this small polyp should happen to be adenomatous in character  REPEAT EXAM: not necessary  cc:  _______________________________ eSignedRonald Lobo, MD 07/24/15 9:24 AM   CPT CODES: ICD CODES:  The ICD and CPT codes recommended by this software are interpretations from  the data that the clinical staff has captured with the software.  The verification of the translation of this report to the ICD and CPT codes and modifiers is the sole responsibility of the health care institution and practicing physician where this report was  generated.  [employer] will not be held responsible for the validity of the ICD and CPT codes included on this report.  AMA assumes no liability for data contained or not contained herein. CPT is a Designer, television/film set of the [employer].   PATIENT NAME:  Joe Lee, Joe Lee MR#: [id number]

## 2015-07-23 NOTE — Plan of Care (Signed)
     Joe Lee was admitted to the Hospital on 07/20/2015 and Discharged  07/23/2015 and should be excused from work/school   for 7 days starting 07/20/2015 , may return to work/school without any restrictions.  Call Lala Lund MD, [facility]  [phone] with questions.  Thurnell Lose M.D on 07/23/2015,at 10:51 AM  [facility]   Office  [phone]

## 2015-07-23 NOTE — Discharge Summary (Signed)
Joe GAYMON Sr., is a 81 y.o. male  DOB [birth year]  MRN [id number].  Admission date:  07/20/2015  Admitting Physician  Wilhelmina Mcardle, MD  Discharge Date:  07/23/2015   Primary MD  Purvis Kilts, MD  Recommendations for primary care physician for things to follow:   Check CBC and A1c next visit.   Admission Diagnosis  Hematochezia [K92.1]   Discharge Diagnosis  Hematochezia [K92.1]    Active Problems:   Hematochezia   GI bleed   Paroxysmal atrial fibrillation   Coagulopathy      Past Medical History  Diagnosis Date  . AF (atrial fibrillation) 06/06/2010    2D Echo EF>55% normal Echo  . GERD (gastroesophageal reflux disease)   . Diverticulitis   . Chronic constipation   . HTN (hypertension)   . Hyperlipidemia   . Colon polyp 1/07    adenomatous  . OSA on CPAP 11/09/2007    sleep study REM 17mins AHI was 3.64hr at 7cm  . SOB (shortness of breath) 05/30/2010    stress test normal stress test EF63%    Past Surgical History  Procedure Laterality Date  . Knee surgery      right-arthroscopy  . Hemorrhoid surgery    . Rotator cuff repair      right  . Av fistula repair    . Cholecystectomy      Dr [name] -[facility]  . Cataract extraction w/phaco  07/28/2012    Procedure: CATARACT EXTRACTION PHACO AND INTRAOCULAR LENS PLACEMENT (IOC);  Surgeon: [name], MD;  Location: [facility];  Service: Ophthalmology;  Laterality: Right;  CDE=10.80  . Hernia repair    . 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 He has a PMH as below, which includes AF on Xarelto, GERD, Diverticulitis, HTN, and OSA on CPAP. He was hospitalized  about 2 years ago with abdominal pain that was diagnosed as diverticulitis. He was given IV antibiotics for a very short period and was discharged to home. About one week prior to current admission he developed diarrhea for a few days and took kaopectate for this, however this resulted in severe constipation with associated lower abdominal pain, particularly RLQ. He took a laxative that was prescribed to his wife, he does not know what its called. He then developed diarrhea again, but because he was still having abdominal pain he went to health store "something like [place]" and bought another laxative/cleanse pill. This caused him to have profound diarrhea, at which point he noted hematochezia. He describes this as a moderate amount of blood mixed in with liquid stool. He has had about 10 episodes of this so far today prior to my evaluation. He presented to ED 8/4 after having dizziness while in line at store which caused him to "fall out", fortunately  he was able to grab onto railing and lower himself slowly so he subjectively sustained no injuries. In ED he was hemodynamically stable He was admitted to ICU by EDP. PCCM to admit. Currently he denies dizziness, chest pain, SOB, nausea vomiting. He complains of mild lower abdominal pain, worse in RLQ.      Hospital Course:     1. Hematochezia. Painless lower GI bleed most likely secondary to diverticulosis, patient was on Eliquis and aspirin as well. Did not require any transfusions and H&H remained stable. Had mild bleeding related anemia not requiring transfusion. Was seen by GI. Underwent colonoscopy on 07/23/2015 which was unremarkable. Will be sent home with instructions to skip his Eliquis and aspirin for the next 3 days. Outpatient follow-up with PCP and GI in 1 week. Request PCP to recheck CBC next visit.  2. Laxative induced diarrhea. Resolved.   3. Paroxysmal atrial fibrillation. [place] Vasc 2 - 3. Continue calcium channel blocker along with beta  blocker combination which she takes at home, skip aspirin and Eliquis for the next 3 days. I do not see any history of CAD. We'll request PCP to consider stopping his aspirin.   4. ARF. Due to diarrhea and blood loss related mild anemia. Post hydration renal function has stabilized.   5. Obstructive sleep apnea. Noncompliant with C Pap.   6. Essential hypertension. Resume home regimen.   7. Mildly elevated sugars upon admission. Request PCP to check A1c next visit.   Discharge Condition: Stable  Follow UP  Follow-up Information    Follow up with Purvis Kilts, MD. Schedule an appointment as soon as possible for a visit in 1 week.   Specialty:  Family Medicine   Contact information:   [address] [place] [address] [phone]       Follow up with Cleotis Nipper, MD. Schedule an appointment as soon as possible for a visit in 1 week.   Specialty:  Gastroenterology   Contact information:   [address] [place] [address] [phone]        Consults obtained -  GI  Diet and Activity recommendation: See Discharge Instructions below  Discharge Instructions       Discharge Instructions    Diet - low sodium heart healthy    Complete by:  As directed      Discharge instructions    Complete by:  As directed   Follow with Primary MD Purvis Kilts, MD in 7 days   Get CBC, CMP, 2 view Chest X ray checked  by Primary MD next visit.    Activity: As tolerated with Full fall precautions use walker/cane & assistance as needed   Disposition Home     Diet: Heart Healthy    For Heart failure patients - Check your Weight same time everyday, if you gain over 2 pounds, or you develop in leg swelling, experience more shortness of breath or chest pain, call your Primary MD immediately. Follow Cardiac Low Salt Diet and 1.5 lit/day fluid restriction.   On your next visit with your primary care physician please Get Medicines reviewed  and adjusted.   Please request your Prim.MD to go over all Hospital Tests and Procedure/Radiological results at the follow up, please get all Hospital records sent to your Prim MD by signing hospital release before you go home.   If you experience worsening of your admission symptoms, develop shortness of breath, life threatening emergency, suicidal or homicidal thoughts you must seek  medical attention immediately by calling 911 or calling your MD immediately  if symptoms less severe.  You Must read complete instructions/literature along with all the possible adverse reactions/side effects for all the Medicines you take and that have been prescribed to you. Take any new Medicines after you have completely understood and accpet all the possible adverse reactions/side effects.   Do not drive, operating heavy machinery, perform activities at heights, swimming or participation in water activities or provide baby sitting services if your were admitted for syncope or siezures until you have seen by Primary MD or a Neurologist and advised to do so again.  Do not drive when taking Pain medications.    Do not take more than prescribed Pain, Sleep and Anxiety Medications  Special Instructions: If you have smoked or chewed Tobacco  in the last 2 yrs please stop smoking, stop any regular Alcohol  and or any Recreational drug use.  Wear Seat belts while driving.   Please note  You were cared for by a hospitalist during your hospital stay. If you have any questions about your discharge medications or the care you received while you were in the hospital after you are discharged, you can call the unit and asked to speak with the hospitalist on call if the hospitalist that took care of you is not available. Once you are discharged, your primary care physician will handle any further medical issues. Please note that NO REFILLS for any discharge medications will be authorized once you are discharged, as it is  imperative that you return to your primary care physician (or establish a relationship with a primary care physician if you do not have one) for your aftercare needs so that they can reassess your need for medications and monitor your lab values.     Increase activity slowly    Complete by:  As directed              Discharge Medications       Medication List    STOP taking these medications        naproxen sodium 220 MG tablet  Commonly known as:  ANAPROX      TAKE these medications        aspirin EC 81 MG tablet  Take 1 tablet (81 mg total) by mouth daily.  Start taking on:  07/26/2015     BEANO PO  Take 1 tablet by mouth daily as needed. For gas     diltiazem 180 MG 24 hr capsule  Commonly known as:  CARTIA XT  Take 1 capsule (180 mg total) by mouth daily. Patient needs to call our office to schedule an appointment for future refills.     ELIQUIS 5 MG Tabs tablet  Generic drug:  apixaban  Take 1 tablet (5 mg total) by mouth 2 (two) times daily.  Start taking on:  07/26/2015     fish oil-omega-3 fatty acids 1000 MG capsule  Take 1 g by mouth daily.     GARLIC PO  Take 1 capsule by mouth daily.     metoprolol 50 MG tablet  Commonly known as:  LOPRESSOR  Take 1 tablet (50 mg total) by mouth 2 (two) times daily.     multivitamin with minerals Tabs tablet  Take 1 tablet by mouth daily.     simvastatin 20 MG tablet  Commonly known as:  ZOCOR  Take 1 tablet (20 mg total) by mouth at bedtime. Must keep next appointment for future  refills.        Major procedures and Radiology Reports - PLEASE review detailed and final reports for all details, in brief -    No results found.  Micro Results      Recent Results (from the past 240 hour(s))  MRSA PCR Screening     Status: None   Collection Time: 07/20/15  9:18 PM  Result Value Ref Range Status   MRSA by PCR NEGATIVE NEGATIVE Final    Comment:        The GeneXpert MRSA Assay (FDA approved for NASAL  specimens only), is one component of a comprehensive MRSA colonization surveillance program. It is not intended to diagnose MRSA infection nor to guide or monitor treatment for MRSA infections.        Today   Subjective    Ahyan Kreeger today has no headache,no chest abdominal pain,no new weakness tingling or numbness, feels much better wants to go home today.     Objective   Blood pressure 114/62, pulse 81, temperature 97.8 F (36.6 C), temperature source Oral, resp. rate 21, height 5\' 10"  (1.778 m), weight 94.78 kg (208 lb 15.2 oz), SpO2 98 %.   Intake/Output Summary (Last 24 hours) at 07/23/15 0949 Last data filed at 07/23/15 6606  Gross per 24 hour  Intake 464.33 ml  Output    200 ml  Net 264.33 ml    Exam Awake Alert, Oriented x 3, No new F.N deficits, Normal affect [place].AT,PERRAL Supple Neck,No JVD, No cervical lymphadenopathy appriciated.  Symmetrical Chest wall movement, Good air movement bilaterally, CTAB RRR,No Gallops,Rubs or new Murmurs, No Parasternal Heave +ve B.Sounds, Abd Soft, Non tender, No organomegaly appriciated, No rebound -guarding or rigidity. No Cyanosis, Clubbing or edema, No new Rash or bruise   Data Review   CBC w Diff: Lab Results  Component Value Date   WBC 4.6 07/23/2015   HGB 13.9 07/23/2015   HCT 42.1 07/23/2015   PLT  07/23/2015    PLATELET CLUMPS NOTED ON SMEAR, COUNT APPEARS ADEQUATE   LYMPHOPCT 44 05/22/2010   MONOPCT 7 05/22/2010   EOSPCT 2 05/22/2010   BASOPCT 2* 05/22/2010    CMP: Lab Results  Component Value Date   NA 140 07/23/2015   K 3.9 07/23/2015   CL 107 07/23/2015   CO2 24 07/23/2015   BUN 9 07/23/2015   CREATININE 1.11 07/23/2015   CREATININE 0.98 06/14/2013   PROT 5.8* 07/23/2015   ALBUMIN 2.9* 07/23/2015   BILITOT 0.4 07/23/2015   ALKPHOS 53 07/23/2015   AST 26 07/23/2015   ALT 15* 07/23/2015  .   Total Time in preparing paper work, data evaluation and todays exam - 35  minutes  Thurnell Lose M.D on 07/23/2015 at 9:49 AM  [facility]   Office  [phone]

## 2015-07-24 ENCOUNTER — Encounter (HOSPITAL_COMMUNITY): Payer: Self-pay | Admitting: Gastroenterology

## 2015-07-24 LAB — HEMOGLOBIN A1C
Hgb A1c MFr Bld: 5.7 % — ABNORMAL HIGH (ref 4.8–5.6)
Mean Plasma Glucose: 117 mg/dL

## 2015-09-07 ENCOUNTER — Encounter: Payer: Self-pay | Admitting: Internal Medicine

## 2015-09-07 ENCOUNTER — Ambulatory Visit (INDEPENDENT_AMBULATORY_CARE_PROVIDER_SITE_OTHER): Payer: 59 | Admitting: Internal Medicine

## 2015-09-07 VITALS — BP 128/82 | HR 65 | Ht 70.0 in | Wt 215.0 lb

## 2015-09-07 DIAGNOSIS — E785 Hyperlipidemia, unspecified: Secondary | ICD-10-CM

## 2015-09-07 DIAGNOSIS — I480 Paroxysmal atrial fibrillation: Secondary | ICD-10-CM | POA: Diagnosis not present

## 2015-09-07 DIAGNOSIS — Z79899 Other long term (current) drug therapy: Secondary | ICD-10-CM | POA: Diagnosis not present

## 2015-09-07 DIAGNOSIS — I10 Essential (primary) hypertension: Secondary | ICD-10-CM | POA: Diagnosis not present

## 2015-09-07 DIAGNOSIS — K5909 Other constipation: Secondary | ICD-10-CM

## 2015-09-07 DIAGNOSIS — I8393 Asymptomatic varicose veins of bilateral lower extremities: Secondary | ICD-10-CM

## 2015-09-07 LAB — COMPREHENSIVE METABOLIC PANEL
ALBUMIN: 3.8 g/dL (ref 3.6–5.1)
ALK PHOS: 49 U/L (ref 40–115)
ALT: 13 U/L (ref 9–46)
AST: 21 U/L (ref 10–35)
BUN: 18 mg/dL (ref 7–25)
CO2: 28 mmol/L (ref 20–31)
Calcium: 9.6 mg/dL (ref 8.6–10.3)
Chloride: 106 mmol/L (ref 98–110)
Creat: 0.99 mg/dL (ref 0.70–1.18)
Glucose, Bld: 86 mg/dL (ref 65–99)
POTASSIUM: 4.4 mmol/L (ref 3.5–5.3)
Sodium: 141 mmol/L (ref 135–146)
TOTAL PROTEIN: 6.6 g/dL (ref 6.1–8.1)
Total Bilirubin: 0.3 mg/dL (ref 0.2–1.2)

## 2015-09-07 LAB — LIPID PANEL
CHOLESTEROL: 114 mg/dL — AB (ref 125–200)
HDL: 61 mg/dL (ref >=40)
LDL CALC: 41 mg/dL (ref <130)
TRIGLYCERIDES: 59 mg/dL (ref <150)
Total CHOL/HDL Ratio: 1.9 Ratio (ref <=5.0)
VLDL: 12 mg/dL (ref <30)

## 2015-09-07 MED ORDER — ELIQUIS 5 MG PO TABS: 5.0000 mg | ORAL_TABLET | Freq: Two times a day (BID) | ORAL | Status: DC

## 2015-09-07 MED ORDER — PROBIOTIC DAILY PO CAPS: 1.0000 | ORAL_CAPSULE | Freq: Every day | ORAL | Status: AC

## 2015-09-07 MED ORDER — DILTIAZEM HCL ER COATED BEADS 180 MG PO CP24: 180.0000 mg | ORAL_CAPSULE | Freq: Every day | ORAL | Status: DC

## 2015-09-07 MED ORDER — SIMVASTATIN 20 MG PO TABS: 20.0000 mg | ORAL_TABLET | Freq: Every day | ORAL | Status: DC

## 2015-09-07 MED ORDER — METOPROLOL TARTRATE 50 MG PO TABS: 50.0000 mg | ORAL_TABLET | Freq: Two times a day (BID) | ORAL | Status: DC

## 2015-09-07 NOTE — Progress Notes (Signed)
OFFICE NOTE  Chief Complaint:   routine follow-up  Primary Care Physician: Joe Kilts, MD  HPI:  Joe Nigh Sr. is a 81 year old gentleman with history of paroxysmal A-fib, mild nonobstructive coronary disease, hypertension and dyslipidemia. He also has CPAP and has done well with that. He remains active although has had a few pounds of weight gain, really has no symptoms. We talked about stroke prevention with regard to paroxysmal atrial fibrillation which he has had in the past and he was on Coumadin. He has since switched to Eliquis 5 mg by mouth twice a day and is tolerating this very well.  He denies any bleeding complications. He has not had any recurrent atrial fibrillation and he is aware of.  Overall he denies any chest pain or worsening shortness of breath. He does report some lower leg swelling, which is improved with wearing stockings.  He has no new complaints today. We obtained recent laboratory work shows an excellent cholesterol profile. Total cholesterol is 124, triglycerides 88, HDL 57 LDL 49.   Joe Lee  returns today. He is without any significant complaints.  Unfortunately, he recently had some lower GI bleeding. He underwent colonoscopy and there was a small polyp but no clear source for this. He since been started on iron, fiber, and a probiotic. He is in need of refills of his medications today. He continues on Eliquis and reports that the bleeding has subsided.  PMHx:  Past Medical History  Diagnosis Date  . AF (atrial fibrillation) 06/06/2010    2D Echo EF>55% normal Echo  . GERD (gastroesophageal reflux disease)   . Diverticulitis   . Chronic constipation   . HTN (hypertension)   . Hyperlipidemia   . Colon polyp 1/07    adenomatous  . OSA on CPAP 11/09/2007    sleep study REM 9mins AHI was 3.64hr at 7cm  . SOB (shortness of breath) 05/30/2010    stress test normal stress test EF63%    Past Surgical History  Procedure  Laterality Date  . Knee surgery      right-arthroscopy  . Hemorrhoid surgery    . Rotator cuff repair      right  . Av fistula repair    . Cholecystectomy      Dr [name] -[facility]  . Cataract extraction w/phaco  07/28/2012    Procedure: CATARACT EXTRACTION PHACO AND INTRAOCULAR LENS PLACEMENT (IOC);  Surgeon: [name], MD;  Location: [facility];  Service: Ophthalmology;  Laterality: Right;  CDE=10.80  . Hernia repair    . Cardiac catheterization  02/06/2007  . Colonoscopy N/A 07/23/2015    Procedure: COLONOSCOPY;  Surgeon: [name], MD;  [facility];  Service: Endoscopy;  Laterality: N/A;    FAMHx:  History reviewed. No pertinent family history.  SOCHx:   reports that he quit smoking about 38 years ago. His smoking use included Cigarettes. He has a 1.5 pack-year smoking history. He has never used smokeless tobacco. He reports that he drinks about 4.8 oz of alcohol per week. He reports that he does not use illicit drugs.  ALLERGIES:  Allergies  Allergen Reactions  . Penicillins Hives and Itching    ROS: A comprehensive review of systems was negative.  HOME MEDS: Current Outpatient Prescriptions  Medication Sig Dispense Refill  . Alpha-D-Galactosidase (BEANO PO) Take 1 tablet by mouth daily as needed. For gas    . aspirin EC 81 MG tablet Take 1 tablet (81 mg total) by mouth  daily.    . diltiazem (CARTIA XT) 180 MG 24 hr capsule Take 1 capsule (180 mg total) by mouth daily. 90 capsule 3  . docusate sodium (COLACE) 100 MG capsule Take 100 mg by mouth daily as needed for mild constipation.    Marland Kitchen ELIQUIS 5 MG TABS tablet Take 1 tablet (5 mg total) by mouth 2 (two) times daily. 180 tablet 3  . FIBER PO Take 500 mg by mouth daily.    . fish oil-omega-3 fatty acids 1000 MG capsule Take 1 g by mouth daily.     Marland Kitchen GARLIC PO Take 1 capsule by mouth daily.     . IRON PO Take 27 mg by mouth daily.    . metoprolol (LOPRESSOR) 50 MG tablet Take 1 tablet (50 mg total) by mouth 2  (two) times daily. 180 tablet 3  . Multiple Vitamin (MULTIVITAMIN WITH MINERALS) TABS Take 1 tablet by mouth daily.    . Probiotic Product (PROBIOTIC DAILY PO) Take by mouth daily.    . simvastatin (ZOCOR) 20 MG tablet Take 1 tablet (20 mg total) by mouth at bedtime. 90 tablet 3   No current facility-administered medications for this visit.    LABS/IMAGING: No results found for this or any previous visit (from the past 48 hour(s)). No results found.  VITALS: BP 128/82 mmHg  Pulse 65  Ht 5\' 10"  (1.778 m)  Wt 215 lb (97.523 kg)  BMI 30.85 kg/m2  EXAM: General appearance: alert and no distress Neck: no adenopathy, no carotid bruit, no JVD, supple, symmetrical, trachea midline and thyroid not enlarged, symmetric, no tenderness/mass/nodules Lungs: clear to auscultation bilaterally Heart: regular rate and rhythm, S1, S2 normal, no murmur, click, rub or gallop Abdomen: soft, non-tender; bowel sounds normal; no masses,  no organomegaly Extremities: extremities normal, atraumatic, no cyanosis or edema and There is a lipoma over the left scapula Pulses: 2+ and symmetric Skin: Skin color, texture, turgor normal. No rashes or lesions Neurologic: Grossly normal  EKG:  deferred  ASSESSMENT: 1. Paroxysmal atrial fibrillation-now in sinus on Eliquis 2. Recent GI bleed without a clear source 3. Hypertension 4. Dyslipidemia - at goal 5. Obstructive sleep apnea on CPAP 6. Chronic venous insufficiency with edema  PLAN: 1.   Overall [name] is doing well. Blood pressure is well controlled. His cholesterol is at goal. He is maintaining sinus rhythm on Eliquis.  He did have a recent GI bleed without clear source. He is now on iron rate placement. He is tolerating his CPAP. There are no new issues. I would recommend continuing his current medications we'll plan to see him back in one year.  [name], MD, [facility] Attending Cardiologist [place] [facility] [place] 09/07/2015, 5:44  PM

## 2015-09-07 NOTE — Patient Instructions (Signed)
Your physician recommends that you return for lab work in: [place] recommends that you schedule a follow-up appointment in: [place] [address]. HILTY

## 2015-10-07 ENCOUNTER — Inpatient Hospital Stay: Payer: BLUE CROSS/BLUE SHIELD | Admitting: Internal Medicine

## 2015-10-07 ENCOUNTER — Emergency Department: Payer: Medicare Other

## 2015-10-07 ENCOUNTER — Inpatient Hospital Stay: Payer: Medicare Other

## 2015-10-07 ENCOUNTER — Inpatient Hospital Stay
Admission: EM | Admit: 2015-10-07 | Discharge: 2015-10-17 | DRG: 871 | Disposition: E | Payer: Medicare Other | Attending: Internal Medicine | Admitting: Internal Medicine

## 2015-10-07 DIAGNOSIS — J9691 Respiratory failure, unspecified with hypoxia: Secondary | ICD-10-CM | POA: Diagnosis present

## 2015-10-07 DIAGNOSIS — R6521 Severe sepsis with septic shock: Secondary | ICD-10-CM | POA: Diagnosis present

## 2015-10-07 DIAGNOSIS — J189 Pneumonia, unspecified organism: Secondary | ICD-10-CM | POA: Diagnosis present

## 2015-10-07 DIAGNOSIS — R509 Fever, unspecified: Secondary | ICD-10-CM

## 2015-10-07 DIAGNOSIS — D649 Anemia, unspecified: Secondary | ICD-10-CM | POA: Diagnosis present

## 2015-10-07 DIAGNOSIS — D696 Thrombocytopenia, unspecified: Secondary | ICD-10-CM | POA: Diagnosis present

## 2015-10-07 DIAGNOSIS — Z515 Encounter for palliative care: Secondary | ICD-10-CM | POA: Diagnosis present

## 2015-10-07 DIAGNOSIS — G92 Toxic encephalopathy: Secondary | ICD-10-CM | POA: Diagnosis present

## 2015-10-07 DIAGNOSIS — E872 Acidosis: Secondary | ICD-10-CM | POA: Diagnosis present

## 2015-10-07 DIAGNOSIS — E43 Unspecified severe protein-calorie malnutrition: Secondary | ICD-10-CM | POA: Diagnosis present

## 2015-10-07 DIAGNOSIS — Z8673 Personal history of transient ischemic attack (TIA), and cerebral infarction without residual deficits: Secondary | ICD-10-CM

## 2015-10-07 DIAGNOSIS — I10 Essential (primary) hypertension: Secondary | ICD-10-CM | POA: Diagnosis present

## 2015-10-07 DIAGNOSIS — E785 Hyperlipidemia, unspecified: Secondary | ICD-10-CM | POA: Diagnosis present

## 2015-10-07 DIAGNOSIS — Z6825 Body mass index (BMI) 25.0-25.9, adult: Secondary | ICD-10-CM

## 2015-10-07 DIAGNOSIS — R4182 Altered mental status, unspecified: Secondary | ICD-10-CM

## 2015-10-07 DIAGNOSIS — N400 Enlarged prostate without lower urinary tract symptoms: Secondary | ICD-10-CM | POA: Diagnosis present

## 2015-10-07 DIAGNOSIS — A419 Sepsis, unspecified organism: Principal | ICD-10-CM | POA: Diagnosis present

## 2015-10-07 DIAGNOSIS — I959 Hypotension, unspecified: Secondary | ICD-10-CM | POA: Diagnosis present

## 2015-10-07 DIAGNOSIS — N390 Urinary tract infection, site not specified: Secondary | ICD-10-CM | POA: Diagnosis present

## 2015-10-07 DIAGNOSIS — Z66 Do not resuscitate: Secondary | ICD-10-CM | POA: Diagnosis present

## 2015-10-07 DIAGNOSIS — Z7982 Long term (current) use of aspirin: Secondary | ICD-10-CM

## 2015-10-07 LAB — I-STAT CG4 VENOUS CARTRIDGE
Lactic Acid I-Stat: 3.3 mmol/L — ABNORMAL HIGH (ref 0.2–2.0)
Lactic Acid I-Stat: 3.2 mmol/L — ABNORMAL HIGH (ref 0.2–2.0)
i-STAT Base Excess Venous: -4 mEq/L
i-STAT Base Excess Venous: -5 mEq/L
i-STAT FIO2: 21
i-STAT FIO2: 21
i-STAT HCO3 Bicarbonate Venous: 20.6 mEq/L
i-STAT HCO3 Bicarbonate Venous: 20.5 mEq/L
i-STAT Liters Per Minute: 15
i-STAT Liters Per Minute: 10
i-STAT O2 Saturation Venous: 60 %
i-STAT O2 Saturation Venous: 78 %
i-STAT Patient Temperature: 99.3
i-STAT Patient Temperature: 98.5
i-STAT Total CO2 Venous: 22 mEq/L
i-STAT Total CO2 Venous: 22 mEq/L
i-STAT pCO2 Venous: 34.2
i-STAT pCO2 Venous: 38.6
i-STAT pH Venous: 7.389
i-STAT pH Venous: 7.333
i-STAT pO2 Venous: 32
i-STAT pO2 Venous: 45

## 2015-10-07 LAB — HEPATIC FUNCTION PANEL
ALT: 30 U/L (ref 0–55)
AST (SGOT): 26 U/L (ref 5–34)
Albumin/Globulin Ratio: 0.9 (ref 0.9–2.2)
Albumin: 1.6 g/dL — ABNORMAL LOW (ref 3.5–5.0)
Alkaline Phosphatase: 62 U/L (ref 38–106)
Bilirubin Direct: 1.2 mg/dL — ABNORMAL HIGH (ref 0.0–0.5)
Bilirubin Indirect: 0.7 mg/dL (ref 0.0–1.1)
Bilirubin, Total: 1.9 mg/dL — ABNORMAL HIGH (ref 0.2–1.2)
Globulin: 1.7 g/dL — ABNORMAL LOW (ref 2.0–3.6)
Protein, Total: 3.3 g/dL — ABNORMAL LOW (ref 6.0–8.3)

## 2015-10-07 LAB — BASIC METABOLIC PANEL
BUN: 22 mg/dL (ref 9.0–28.0)
BUN: 29 mg/dL — ABNORMAL HIGH (ref 9.0–28.0)
CO2: 18 mEq/L — ABNORMAL LOW (ref 22–29)
CO2: 17 mEq/L — ABNORMAL LOW (ref 22–29)
Calcium: 6.4 mg/dL — ABNORMAL LOW (ref 7.9–10.2)
Calcium: 7.3 mg/dL — ABNORMAL LOW (ref 7.9–10.2)
Chloride: 113 mEq/L — ABNORMAL HIGH (ref 100–111)
Chloride: 112 mEq/L — ABNORMAL HIGH (ref 100–111)
Creatinine: 1 mg/dL (ref 0.7–1.3)
Creatinine: 1.3 mg/dL (ref 0.7–1.3)
Glucose: 81 mg/dL (ref 70–100)
Glucose: 115 mg/dL — ABNORMAL HIGH (ref 70–100)
Potassium: 3.4 mEq/L — ABNORMAL LOW (ref 3.5–5.1)
Potassium: 4.2 mEq/L (ref 3.5–5.1)
Sodium: 138 mEq/L (ref 136–145)
Sodium: 138 mEq/L (ref 136–145)

## 2015-10-07 LAB — URINALYSIS, REFLEX TO MICROSCOPIC EXAM IF INDICATED
Nitrite, UA: NEGATIVE
Protein, UR: 100 — AB
Specific Gravity UA: 1.024 (ref 1.001–1.035)
Urine pH: 6 (ref 5.0–8.0)
Urobilinogen, UA: 2 mg/dL (ref 0.2–2.0)

## 2015-10-07 LAB — MAN DIFF ONLY
Band Neutrophils Absolute: 4.63 10*3/uL — ABNORMAL HIGH (ref 0.00–1.00)
Band Neutrophils: 33 %
Lymphocytes Absolute Manual: 0.38 10*3/uL — ABNORMAL LOW (ref 0.50–4.40)
Lymphocytes Manual: 3 %
Metamyelocytes Absolute: 0.5 10*3/uL — ABNORMAL HIGH
Metamyelocytes: 4 %
Myelocytes Absolute: 0.5 10*3/uL — ABNORMAL HIGH
Myelocytes: 4 %
Neutrophils Absolute Manual: 8.01 10*3/uL (ref 1.80–8.10)
Nucleated RBC Absolute: 0.13 10*3/uL — ABNORMAL HIGH
Nucleated RBC: 1 /100 WBC (ref 0–1)
Segmented Neutrophils: 57 %

## 2015-10-07 LAB — CELL MORPHOLOGY
Cell Morphology: NORMAL
Platelet Estimate: DECREASED — AB

## 2015-10-07 LAB — CBC AND DIFFERENTIAL
Hematocrit: 29.4 % — ABNORMAL LOW (ref 42.0–52.0)
Hgb: 9.7 g/dL — ABNORMAL LOW (ref 13.0–17.0)
MCH: 30.6 pg (ref 28.0–32.0)
MCHC: 33 g/dL (ref 32.0–36.0)
MCV: 92.7 fL (ref 80.0–100.0)
MPV: 10.9 fL (ref 9.4–12.3)
Platelets: 131 10*3/uL — ABNORMAL LOW (ref 140–400)
RBC: 3.17 10*6/uL — ABNORMAL LOW (ref 4.70–6.00)
RDW: 14 % (ref 12–15)
WBC: 14.02 10*3/uL — ABNORMAL HIGH (ref 3.50–10.80)

## 2015-10-07 LAB — GFR
EGFR: >60
EGFR: 53.2

## 2015-10-07 LAB — LACTIC ACID, PLASMA: Lactic Acid: 2.7 mmol/L — ABNORMAL HIGH (ref 0.2–2.0)

## 2015-10-07 MED ORDER — SODIUM CHLORIDE 0.9 % IV BOLUS
1000.0000 mL | Freq: Once | INTRAVENOUS | Status: AC
Start: 2015-10-07 — End: 2015-10-07
  Administered 2015-10-07: 1000 mL via INTRAVENOUS

## 2015-10-07 MED ORDER — NALOXONE HCL 0.4 MG/ML IJ SOLN
0.2000 mg | INTRAMUSCULAR | Status: DC | PRN
Start: 2015-10-07 — End: 2015-10-14

## 2015-10-07 MED ORDER — ASPIRIN 81 MG PO TBEC
81.0000 mg | DELAYED_RELEASE_TABLET | Freq: Every day | ORAL | Status: DC
Start: 2015-10-07 — End: 2015-10-07

## 2015-10-07 MED ORDER — ALBUTEROL-IPRATROPIUM 2.5-0.5 (3) MG/3ML IN SOLN
3.0000 mL | RESPIRATORY_TRACT | Status: DC | PRN
Start: 2015-10-07 — End: 2015-10-14
  Administered 2015-10-07: 3 mL via RESPIRATORY_TRACT
  Filled 2015-10-07: qty 3

## 2015-10-07 MED ORDER — VANCOMYCIN HCL 1000 MG IV SOLR
2000.0000 mg | Freq: Once | INTRAVENOUS | Status: AC
Start: 2015-10-07 — End: 2015-10-07
  Administered 2015-10-07: 2000 mg via INTRAVENOUS
  Filled 2015-10-07: qty 2000

## 2015-10-07 MED ORDER — SODIUM CHLORIDE 0.9 % IV SOLN
INTRAVENOUS | Status: DC
Start: 2015-10-07 — End: 2015-10-07

## 2015-10-07 MED ORDER — SODIUM CHLORIDE 0.9 % IV MBP
4.5000 g | Freq: Once | INTRAVENOUS | Status: AC
Start: 2015-10-07 — End: 2015-10-07
  Administered 2015-10-07: 4.5 g via INTRAVENOUS
  Filled 2015-10-07: qty 20

## 2015-10-07 MED ORDER — DOCUSATE SODIUM 100 MG PO CAPS
100.0000 mg | ORAL_CAPSULE | Freq: Two times a day (BID) | ORAL | Status: DC
Start: 2015-10-07 — End: 2015-10-07

## 2015-10-07 MED ORDER — MORPHINE 1 MG/ML IN 100 ML D5W INFUSION (OUTSOURCED)
4.0000 mg/h | INTRAVENOUS | Status: DC
Start: 2015-10-07 — End: 2015-10-14
  Administered 2015-10-07 (×2): 1 mg/h via INTRAVENOUS
  Administered 2015-10-09 – 2015-10-12 (×3): 2.5 mg/h via INTRAVENOUS
  Filled 2015-10-07 (×4): qty 100

## 2015-10-07 MED ORDER — ONDANSETRON HCL 4 MG/2ML IJ SOLN
4.0000 mg | Freq: Three times a day (TID) | INTRAMUSCULAR | Status: DC | PRN
Start: 2015-10-07 — End: 2015-10-14

## 2015-10-07 MED ORDER — VANCOMYCIN 1000 MG IN 250 ML NS IVPB VIAL-MATE (CNR)
1.0000 g | Freq: Two times a day (BID) | INTRAVENOUS | Status: DC
Start: 2015-10-07 — End: 2015-10-07

## 2015-10-07 MED ORDER — ATORVASTATIN CALCIUM 20 MG PO TABS
10.0000 mg | ORAL_TABLET | Freq: Every day | ORAL | Status: DC
Start: 2015-10-07 — End: 2015-10-07

## 2015-10-07 MED ORDER — TAMSULOSIN HCL 0.4 MG PO CAPS
0.4000 mg | ORAL_CAPSULE | Freq: Every day | ORAL | Status: DC
Start: 2015-10-07 — End: 2015-10-07

## 2015-10-07 MED ORDER — POTASSIUM CHLORIDE 10 MEQ/100ML IV SOLN
10.0000 meq | Freq: Once | INTRAVENOUS | Status: DC
Start: 2015-10-07 — End: 2015-10-07

## 2015-10-07 MED ORDER — ONDANSETRON 4 MG PO TBDP
4.0000 mg | ORAL_TABLET | Freq: Three times a day (TID) | ORAL | Status: DC | PRN
Start: 2015-10-07 — End: 2015-10-14

## 2015-10-07 MED ORDER — METHYLPREDNISOLONE SODIUM SUCC 125 MG IJ SOLR
125.0000 mg | Freq: Four times a day (QID) | INTRAMUSCULAR | Status: DC
Start: 2015-10-07 — End: 2015-10-07
  Administered 2015-10-07: 125 mg via INTRAVENOUS
  Filled 2015-10-07: qty 2

## 2015-10-07 MED ORDER — SODIUM CHLORIDE 0.9 % IV MBP
4.5000 g | Freq: Four times a day (QID) | INTRAVENOUS | Status: DC
Start: 2015-10-07 — End: 2015-10-07

## 2015-10-07 MED ORDER — POTASSIUM CHLORIDE 10 MEQ/100ML IV SOLN
10.0000 meq | Freq: Once | INTRAVENOUS | Status: AC
Start: 2015-10-07 — End: 2015-10-07
  Administered 2015-10-07: 10 meq via INTRAVENOUS
  Filled 2015-10-07: qty 100

## 2015-10-07 MED ORDER — HEPARIN SODIUM (PORCINE) 5000 UNIT/ML IJ SOLN
5000.0000 [IU] | Freq: Two times a day (BID) | INTRAMUSCULAR | Status: DC
Start: 2015-10-07 — End: 2015-10-07
  Administered 2015-10-07: 5000 [IU] via SUBCUTANEOUS
  Filled 2015-10-07: qty 1

## 2015-10-07 MED ORDER — ACETAMINOPHEN 650 MG RE SUPP
650.0000 mg | Freq: Once | RECTAL | Status: AC
Start: 2015-10-07 — End: 2015-10-07
  Administered 2015-10-07: 650 mg via RECTAL
  Filled 2015-10-07: qty 1

## 2015-10-07 NOTE — Significant Event (Signed)
Discussed with patient's son over the phone.  [name] [name] had set the last rites.  Family requested patient be for full comfort care.  Antibiotic was discontinued.  The patient was started on morphine drip

## 2015-10-07 NOTE — ED Provider Notes (Signed)
[facility] EMERGENCY DEPARTMENT ATTENDING PHYSICIAN NOTE     CLINICAL SUMMARY          Diagnosis:    .     Final diagnoses:   Hypotension, unspecified hypotension type   Altered mental status, unspecified   Fever, unspecified fever cause         Disposition:       ED Disposition     Admit Admitting Physician: Karma Ganja [[id number]]  Diagnosis: Hypotension [1308657]  Estimated Length of Stay: > or = to 2 midnights  Tentative Discharge Plan?: Home or Self Care [1]  Patient Class: Inpatient [101]  I certify that inpatient services are medically necessary for this patient. Please see H&P and MD progress notes for additional information about the patient's course of treatment. For Medicare patients, services provided in accordance with 412.3 and expected LOS to be greater than 2 midnights for Medicare patients.: Yes               Discharge Prescriptions     None                                            CLINICAL INFORMATION        HPI:       81 y.o. male BIBA found unresponsive 1 hour pta at home. Last seen normal 4 hours pta. O2 sats were 83 on RA - arrives bagged. EMS reports lactate is 2.8. En route, pt was febrile to 101 and had a systolic blood pressure of ~70.  They gave him 1L of NS.      Of note, EMS states that the pt pulled out his foley earlier in the evening and has had some penile bleeding since then.     History obtained from:  EMS      ROS:      All systems are negative except as noted in the HPI.        Physical Exam:      Pulse 79  BP (!) 87/52 mmHg  Resp 18  SpO2 97 %  Temp 99.3 F (37.4 C)    Nursing note and vitals reviewed.    Constitutional:  Dry mucus membranes. Responds only to sternal rub.  Psych:  Normal affect. Cooperative.   Eyes: No conjunctival injection. No discharge.  Ears, Nose, Mouth and Throat:Tolerating secretions.  No hoarseness.  Respiratory: Coarse sounds b/l.  Tachypneic.   Cardiovascular: No murmurs. Regular rhythm.   Abdomen: Soft.  Non-distended.  Musculoskeletal:           Neck:           Back:            Upper Extremity:           Lower Extremity: no calf or thigh tenderness  Neurological:   Integumentary:   GU: blood at uretheral meatus  Lymphatic:                PAST HISTORY        Primary Care Provider: Kerin Perna, DO        PMH/PSH:      Sirius Woodford has a past medical history of Hyperlipidemia; TIA (transient ischemic attack); BPH (benign prostatic hyperplasia); and Hypertension.  He has no past surgical history on file.      Social/Family History:      He  reports that he has never smoked. He does not have any smokeless tobacco history on file. He reports that he does not drink alcohol or use illicit drugs.  His family history is not on file.      Listed Medications on Arrival:    .     Home Medications     Last Medication Reconciliation Action:  Complete Lebet-Wilson, Aimee E, RN 2015-11-05  4:23 AM                  amLODIPine (NORVASC) 5 MG tablet     Take 5 mg by mouth daily.     aspirin EC 81 MG EC tablet     Take 81 mg by mouth daily.     atorvastatin (LIPITOR) 10 MG tablet     Take 10 mg by mouth daily.     carvedilol (COREG) 12.5 MG tablet     Take 12.5 mg by mouth.     losartan (COZAAR) 100 MG tablet     Take 100 mg by mouth daily.     senna (SENOKOT) 8.6 MG tablet     Take 1 tablet by mouth daily.     tamsulosin (FLOMAX) 0.4 MG Cap     Take 0.4 mg by mouth Daily after dinner.        Allergies: He has No Known Allergies.            VISIT INFORMATION        Clinical Course & Medical Decision Making:      At 4:19 AM:  81 y.o. male p/w fever and altered mental status as described above.  Pt's DNR status was confirmed by me (paperwork at bedside).  BP 80's systolic at this time. HR 80's. Will give IV fluids and broad IV abx.  CXR, labs, blood cultures and urine cultures ordered. NPO. Re-eval.     At 5:26 AM:  Lactate 3.3  CXR shows b/l basilar infiltrates  SBP 90's  D/w Ruby (covering for DR Haghighi's group) who will admit to  [facility].          Medications Given in the ED:    .     ED Medication Orders     Start Ordered     Status Ordering Provider    November 05, 2015 [phone] 11/05/2015 0451  sodium chloride 0.9 % bolus 1,000 mL   Once     Route: Intravenous  Ordered Dose: 1,000 mL     Last MAR action:  New Bag Sanya Kobrin    November 05, 2015 0430 2015/11/05 0429  acetaminophen (TYLENOL) suppository 650 mg   Once     Route: Rectal  Ordered Dose: 650 mg     Last MAR action:  Given Taysom Glymph    November 05, 2015 0417 2015/11/05 0417  vancomycin (VANCOCIN) 2,000 mg in sodium chloride 0.9 % 500 mL IVPB   Once in ED     Route: Intravenous  Ordered Dose: 2,000 mg     Acknowledged Rylinn Linzy    11-05-2015 0408 11-05-15 0407  sodium chloride 0.9 % bolus 1,000 mL   Once     Route: Intravenous  Ordered Dose: 1,000 mL     Last MAR action:  New Bag Nahiem Dredge    11/05/15 0408 11/05/15 0407  piperacillin-tazobactam (ZOSYN) 4.5 g in sodium chloride 0.9 % 100 mL IVPB mini-bag plus   Once     Route: Intravenous  Ordered Dose: 4.5 g     Last MAR action:  New Bag  Itzy Adler            Procedures:      Procedures      Interpretations:          Critical Care Time (not including procedures): 30-74 minutes.   Due to the high risk of critical illness or multi-organ failure at initial presentation and/or during ED course.    System(s) at risk for compromise:  circulatory, respiratory, CNS, renal, hepatic and metabolic  Critical Diagnosis:   1. Hypotension, unspecified hypotension type    2. Altered mental status, unspecified    3. Fever, unspecified fever cause         The patient was Hypotensive:   Yes   The patient was Hypoxic:   No     This does not including time spent performing other reported procedures or services.   Critical care time involved full attention to the patient's condition and included:   Review of nursing notes - Yes  Documentation time - Yes  Care, transfer of care, and discharge plans - Yes  Obtaining necessary history from family, EMS, other physicians, nursing home  staff and/or prior medical records/charts - Yes  Review of medications, allergies, and vital signs - Yes   Ordering, interpreting, and reviewing diagnostic studies/tab tests - Yes                 RESULTS        Lab Results:      Results     Procedure Component Value Units Date/Time    Basic Metabolic Panel [[id number]]  (Abnormal) Collected:  10/01/2015 0431    Specimen Information:  Blood Updated:  09/21/2015 0520     Glucose 81 mg/dL      BUN 13.0 mg/dL      Creatinine 1.0 mg/dL      Calcium 6.4 (L) mg/dL      Sodium 865 mEq/L      Potassium 3.4 (L) mEq/L      Chloride 113 (H) mEq/L      CO2 18 (L) mEq/L     GFR [[id number]] Collected:  10/09/2015 0431     EGFR >60.0 Updated:  09/22/2015 0520    CBC with differential [[id number]]  (Abnormal) Collected:  09/27/2015 0431    Specimen Information:  Blood from Blood Updated:  09/18/2015 0513     WBC 14.02 (H) x10 3/uL      Hgb 9.7 (L) g/dL      Hematocrit 10.2 (L) %      Platelets 131 (L) x10 3/uL      RBC 3.17 (L) x10 6/uL      MCV 92.7 fL      MCH 30.6 pg      MCHC 33.0 g/dL      RDW 14 %      MPV 10.9 fL     Rapid influenza A/B antigens [[id number]] Collected:  09/19/2015 0431    Specimen Information:  Nasopharyngeal from Nasal Aspirate Updated:  09/21/2015 0450    i-Stat CG4 Venous CartrIDge [[id number]]  (Abnormal) Collected:  10/08/2015 0416     i-STAT pH Venous 7.389 Updated:  09/18/2015 0445     i-STAT pCO2 Venous 34.2      i-STAT pO2 Venous 32.0      i-STAT HCO3 Bicarbonate Venous 20.6 mEq/L      i-STAT Total CO2 Venous 22.0 mEq/L      i-STAT Base Excess Venous -4.0 mEq/L      i-STAT O2 Saturation Venous 60.0 %  i-STAT Lactic acid 3.3 (H) mmol/L      i-STAT Patient Temperature 99.3      i-STAT FIO2 21      i-STAT O2 Delivery NRB MASK      i-STAT Allen's Test NA      i-STAT Draw Site Venous      i-STAT Liters Per Minute 15.0     UA, Reflex to Microscopic (pts  3 + yrs) [[id number]] Collected:  10/05/2015 0431    Specimen Information:  Urine Updated:  10/13/2015 0431    Blood Culture  Aerobic/Anaerobic #1 [[id number]] Collected:  10/02/2015 0431    Specimen Information:  Arm from Blood Updated:  09/23/2015 0431    Narrative:      1 BLUE+1 PURPLE    Blood Culture Aerobic/Anaerobic #2 [[id number]] Collected:  10/06/2015 0431    Specimen Information:  Arm from Blood Updated:  09/25/2015 0431    Narrative:      1 BLUE+1 PURPLE    Urine culture [[id number]] Collected:  10/05/2015 0431    Specimen Information:  Urine from Urine, Clean Catch Updated:  09/17/2015 0431              Radiology Results:      XR Chest  AP Portable   Final Result    Minimal nonspecific basilar opacities, probably small foci   of atelectasis.      Wynema Birch, MD    10/08/2015 5:11 AM                     Scribe Attestation:      I was acting as a Neurosurgeon for Delorse Lek, MD on [place],Javarius  Treatment Team: Scribe: Bennett Scrape     I am the first provider for this patient and I personally performed the services documented. Treatment Team: Scribe: Bennett Scrape is scribing for me on Chi[place],Kenaz. This note and the patient instructions accurately reflect work and decisions made by me.  Delorse Lek, MD             Delorse Lek, MD  09/16/2015 [phone]

## 2015-10-07 NOTE — Progress Notes (Signed)
Was called earlier for a consult but patient on comfort care now and antibiotics were stopped  Please call if any questions/chaneg in plans  Discussed with Dr. Berneta Levins MD  Infectious Diseases   [phone]  [phone]

## 2015-10-07 NOTE — ED Notes (Signed)
Pt biba from [facility], after staff found the patient to be unresponsive with sats in the 80's.  Per ems nrb applied and sats improved to 94, but still unresponsive.  Per ems pressure in the 70's

## 2015-10-07 NOTE — H&P (Signed)
[facility], [facility]  Answering service [phone]  Admission History and Physical Examination      Date Time: 09/30/2015 10:56 AM  Patient Name: [name] [name]  Attending Physician: Delorse Lek, MD  Primary Care Physician: Kerin Perna, DO    CC: Septic shock       History of Presenting Illness:   [name] [name] is a 81 y.o. male transferred from [facility] after an episode of unresponsiveness prior to arrival.  On admission, was hypoxic, febrile currently in the ER is on a nonrebreather mask    Past Medical History:     Past Medical History   Diagnosis Date   . Hyperlipidemia    . TIA (transient ischemic attack)    . BPH (benign prostatic hyperplasia)    . Hypertension      Available old records reviewed,    Past Surgical History:   History reviewed. No pertinent past surgical history.    Family History:   History reviewed. No pertinent family history.  Reviewed    Social History:     Social History     Social History   . Marital Status: Unknown     Spouse Name: N/A   . Number of Children: N/A   . Years of Education: N/A     Social History Main Topics   . Smoking status: Never Smoker    . Smokeless tobacco: Not on file   . Alcohol Use: No   . Drug Use: No   . Sexual Activity: Not on file     Other Topics Concern   . Not on file     Social History Narrative   . No narrative on file     Reviewed    Allergies:   No Known Allergies    Medications:     Patient's Medications   New Prescriptions    No medications on file   Previous Medications    AMLODIPINE (NORVASC) 5 MG TABLET    Take 5 mg by mouth daily.    ASPIRIN EC 81 MG EC TABLET    Take 81 mg by mouth daily.    ATORVASTATIN (LIPITOR) 10 MG TABLET    Take 10 mg by mouth daily.    CARVEDILOL (COREG) 12.5 MG TABLET    Take 12.5 mg by mouth.    LOSARTAN (COZAAR) 100 MG TABLET    Take 100 mg by mouth daily.    SENNA (SENOKOT) 8.6 MG TABLET    Take 1 tablet by mouth daily.    TAMSULOSIN (FLOMAX) 0.4 MG CAP    Take 0.4 mg by mouth  Daily after dinner.   Modified Medications    No medications on file   Discontinued Medications    No medications on file        Review of Systems:   Unable to obtian    Physical Exam:     Filed Vitals:    09/16/2015 1040   BP: 89/55   Pulse: 62   Temp:    Resp:    SpO2: 97%       Intake and Output Summary (Last 24 hours) at Date Time  No intake or output data in the 24 hours ending 10/13/2015 1056    General: somnolent resp distress  HEENT: Normocephalic, No icter or conjunctival injection  Neck: supple, no lymphadenopathy, no JVD, no carotid bruits  Cardiovascular: S1, S2 regular rhythm and rate, no murmurs, rubs or gallops  Lungs: Bilateral breath sounds, rhonchi  Abdomen: soft, non-tender, non-distended; no palpable masses, no hepatosplenomegaly, normoactive bowel sounds, no rebound or guarding  Extremities: no clubbing, cyanosis, or edema  Neuro: doesn't follw commands  Skin: no rashes or lesions noted      Labs:   Labs were personally reviewed    Results     Procedure Component Value Units Date/Time    Blood Culture Aerobic/Anaerobic #2 [[id number]] Collected:  09/24/2015 0431    Specimen Information:  Arm from Blood Updated:  09/23/2015 0901    Narrative:      ORDER#: [id number]                                    ORDERED BY: LAL, ASHISH  SOURCE: Blood arm                                    COLLECTED:  10/15/2015 04:31  ANTIBIOTICS AT COLL.:                                RECEIVED :  09/18/2015 09:00  Culture Blood Aerobic and Anaerobic        PRELIM      10/13/2015 09:01  10/06/2015   The amount of blood collected with this order is less than             the recommended volume (8-58mL per bottle). Collection of low             volumes may adversely affect recovery and/or detection time             of pathogens.      Blood Culture Aerobic/Anaerobic #1 [[id number]] Collected:  10/12/2015 0431    Specimen Information:  Arm from Blood Updated:  09/25/2015 0900    Narrative:      1 BLUE+1 PURPLE    UA, Reflex to Microscopic (pts 3 +  yrs) [[id number]]  (Abnormal) Collected:  09/21/2015 0754    Specimen Information:  Urine Updated:  09/25/2015 0826     Urine Type Catheterized, F      Color, UA Brown (A)      Clarity, UA Turbid (A)      Specific Gravity UA 1.024      Urine pH 6.0      Leukocyte Esterase, UA Large (A)      Nitrite, UA Negative      Protein, UR 100 (A)      Glucose, UA Trace (A)      Ketones UA Trace (A)      Urobilinogen, UA 2.0 mg/dL      Bilirubin, UA Small (A)      Blood, UA Large (A)      RBC, UA TNTC (A) /hpf      WBC, UA TNTC (A) /hpf      WBC Clumps, UA Many (A) /hpf     Hepatic function panel (LFT) [[id number]]  (Abnormal) Collected:  09/18/2015 0430    Specimen Information:  Blood Updated:  09/16/2015 0802     Bilirubin, Total 1.9 (H) mg/dL      Bilirubin, Direct 1.2 (H) mg/dL      Bilirubin, Indirect 0.7 mg/dL      AST (SGOT) 26 U/L      ALT 30 U/L  Alkaline Phosphatase 62 U/L      Protein, Total 3.3 (L) g/dL      Albumin 1.6 (L) g/dL      Globulin 1.7 (L) g/dL      Albumin/Globulin Ratio 0.9     Urine culture [[id number]] Collected:  10/06/2015 0754    Specimen Information:  Urine from Urine, Catheterized, Foley Updated:  09/18/2015 0754    Rapid influenza A/B antigens [[id number]] Collected:  09/22/2015 0431    Specimen Information:  Nasopharyngeal from Nasal Aspirate Updated:  10/13/2015 0622    Narrative:      ORDER#: [id number]                                    ORDERED BY: LAL, ASHISH  SOURCE: Nasal Aspirate                               COLLECTED:  09/29/2015 04:31  ANTIBIOTICS AT COLL.:                                RECEIVED :  09/21/2015 04:50  Influenza Rapid Antigen A&B                FINAL       10/13/2015 06:22  09/20/2015   Negative for Influenza A and B             Reference Range: Negative      Manual Differential [[id number]]  (Abnormal) Collected:  09/26/2015 0431     Segmented Neutrophils 57 % Updated:  10/06/2015 0614     Band Neutrophils 33 %      Lymphocytes Manual 3 %      Metamyelocytes 4 %      Myelocytes 4 %      Nucleated  RBC 1 /100 WBC      Abs Seg Manual 8.01 x10 3/uL      Bands Absolute 4.63 (H) x10 3/uL      Absolute Lymph Manual 0.38 (L) x10 3/uL      Metamyelocytes Absolute 0.50 (H) x10 3/uL      Absolute Myelocyte 0.50 (H) x10 3/uL      Absolute NRBC 0.13 (H) x10 3/uL     Cell MorpHology [[id number]]  (Abnormal) Collected:  10/12/2015 0431     Cell Morphology: Normal Updated:  09/30/2015 0614     Platelet Estimate Decreased (A)     CBC with differential [[id number]]  (Abnormal) Collected:  09/29/2015 0431    Specimen Information:  Blood from Blood Updated:  10/01/2015 0614     WBC 14.02 (H) x10 3/uL      Hgb 9.7 (L) g/dL      Hematocrit 23.5 (L) %      Platelets 131 (L) x10 3/uL      RBC 3.17 (L) x10 6/uL      MCV 92.7 fL      MCH 30.6 pg      MCHC 33.0 g/dL      RDW 14 %      MPV 10.9 fL     i-Stat CG4 Venous CartrIDge [[id number]]  (Abnormal) Collected:  09/18/2015 0539     i-STAT pH Venous 7.333 Updated:  09/22/2015 0545     i-STAT pCO2 Venous 38.6      i-STAT pO2 Venous  45.0      i-STAT HCO3 Bicarbonate Venous 20.5 mEq/L      i-STAT Total CO2 Venous 22.0 mEq/L      i-STAT Base Excess Venous -5.0 mEq/L      i-STAT O2 Saturation Venous 78.0 %      i-STAT Lactic acid 3.2 (H) mmol/L      i-STAT Patient Temperature 98.5      i-STAT FIO2 21      i-STAT O2 Delivery NRB MASK      i-STAT Allen's Test NA      i-STAT Draw Site Venous      i-STAT Liters Per Minute 10.0     Basic Metabolic Panel [[id number]]  (Abnormal) Collected:  10/13/2015 0431    Specimen Information:  Blood Updated:  10/10/2015 0520     Glucose 81 mg/dL      BUN 19.1 mg/dL      Creatinine 1.0 mg/dL      Calcium 6.4 (L) mg/dL      Sodium 478 mEq/L      Potassium 3.4 (L) mEq/L      Chloride 113 (H) mEq/L      CO2 18 (L) mEq/L     GFR [[id number]] Collected:  10/16/2015 0431     EGFR >60.0 Updated:  09/24/2015 0520    i-Stat CG4 Venous CartrIDge [[id number]]  (Abnormal) Collected:  09/18/2015 0416     i-STAT pH Venous 7.389 Updated:  09/29/2015 0445     i-STAT pCO2 Venous 34.2      i-STAT pO2  Venous 32.0      i-STAT HCO3 Bicarbonate Venous 20.6 mEq/L      i-STAT Total CO2 Venous 22.0 mEq/L      i-STAT Base Excess Venous -4.0 mEq/L      i-STAT O2 Saturation Venous 60.0 %      i-STAT Lactic acid 3.3 (H) mmol/L      i-STAT Patient Temperature 99.3      i-STAT FIO2 21      i-STAT O2 Delivery NRB MASK      i-STAT Allen's Test NA      i-STAT Draw Site Venous      i-STAT Liters Per Minute 15.0     UA, Reflex to Microscopic (pts  3 + yrs) [[id number]] Collected:  09/25/2015 0431    Specimen Information:  Urine Updated:  10/16/2015 0431    Urine culture [[id number]] Collected:  10/01/2015 0431    Specimen Information:  Urine from Urine, Clean Catch Updated:  09/22/2015 0431          Radiology Results (24 Hour)     Procedure Component Value Units Date/Time    XR Chest  AP Portable [[id number]] Collected:  09/23/2015 0510    Order Status:  Completed Updated:  09/16/2015 0515    Narrative:      Indication: Fever. Altered mental status.    Procedure: Portable AP view of the chest.    Comparison: No prior studies.    Findings: Cardiac size is normal. Low lung volume. Minimal nonspecific  basilar opacities, probably small foci of atelectasis. A small left  effusion may be present. No evidence of pulmonary edema.      Impression:       Minimal nonspecific basilar opacities, probably small foci  of atelectasis.    Wynema Birch, MD   09/22/2015 5:11 AM               Imaging personally reviewed  ECG, tele personally reviewed,  Assessment/ Plan     Patient Active Problem List   Diagnosis   . Hypotension     Septic shock secondary to urinary tract infection, currently on IV antibiotic.  Blood pressures are low despite IV fluid.  I had a long conversation with patient's wife over the phone.  She emphasized that she does want him to be full DO NOT RESUSCITATE.  During our conversation, she also mentioned that she will wants him to be full comfort.  The plan at this time is for patients daughter to come by and see him and afterwards we  will start morphine drip.  I explained to them that his prognosis is probably within hours.  If that and they voiced understanding    Encephalopathy, toxic metabolic secondary to septic shock    Hypoxic respiratory failure, possibly secondary to acute aspiration    Lactic acidosis secondary to above    Anemia, monitor H and H.    Thrombocytopenia, possibly sepsis related.    Severe malnutrition          Signed by: Karma Ganja, MD   ZO:XWRUEA, Bary Richard DO    [facility] contact:    [facility] Babinski [facility]:  [phone]      [facility]:    [facility] NP (M-F 8am-5pm) [phone]  [facility] MD (M-F 8am-5pm)  [phone]    [facility] NP (M-F 8am-5pm) [phone]  [facility] MD (M-F 8am-5pm) [phone]    24 hour answering service M-F 5pm-8am, Saturday and Sunday and if no answer on above numbers, [phone], please request attending physician covering patient that day

## 2015-10-07 NOTE — ED Notes (Signed)
Bed: S 21  Expected date:   Expected time:   Means of arrival: [id number] - [place] [facility]  Comments:

## 2015-10-08 ENCOUNTER — Other Ambulatory Visit: Payer: BLUE CROSS/BLUE SHIELD

## 2015-10-08 ENCOUNTER — Inpatient Hospital Stay: Payer: Medicare Other

## 2015-10-08 NOTE — Progress Notes (Signed)
[facility], [facility]  Answering service [phone]  Progress Note    Date Time: 10/08/2015 9:59 AM  Patient Name: [name] [name],[name] [name]  Attending Physician: Karma Ganja, MD      Subjective:   Patient Seen and Examined. The notes from the last 24 hours were reviewed. Pt comfortable on MS drip    Review of Systems:    Unable to obtain      Physical Exam:     Filed Vitals:    10/08/15 0721   BP: 84/42   Pulse: 57   Temp: 96.3 F (35.7 C)   Resp: 20   SpO2: 98%       Intake and Output Summary (Last 24 hours) at Date Time    Intake/Output Summary (Last 24 hours) at 10/08/15 0959  Last data filed at 10/08/15 0600   Gross per 24 hour   Intake      0 ml   Output    150 ml   Net   -150 ml       General: no acute distress.  HEENT: Normocephalic. No icter or congestion  Neck: supple, no lymphadenopathy, no thyromegaly, no JVD, no carotid bruits  Cardiovascular: S1, S2 regular rhythm and rate, no murmurs, rubs or gallops  Lungs: Bilateral breath sounds, clear to auscultation bilaterally, No wheezing, rhonchi, or rales  Abdomen: soft, non-tender, non-distended; no palpable masses,  no rebound or guarding  Extremities: no clubbing, cyanosis, or edema  Neuro:sleep  Skin: no rashes or lesions noted      Meds:     Scheduled Meds:  Current Facility-Administered Medications   Medication Dose Route Frequency     Continuous Infusions:  . morphine 1 mg/hr (09/22/2015 2029)     PRN Meds:.albuterol-ipratropium, naloxone, ondansetron **OR** ondansetron  I personally reviewed all of the medications        Labs:     Results     Procedure Component Value Units Date/Time    Blood Culture Aerobic/Anaerobic #2 [[id number]] Collected:  10/13/2015 0431    Specimen Information:  Arm from Blood Updated:  10/04/2015 1932    Narrative:      [id number] called Micro Results of Positive Blood Cultures. Read back by:  [id number], by [id number] on 09/23/2015 at 18:03  ORDER#: [id number]                                    ORDERED BY: LAL,  ASHISH  SOURCE: Blood arm                                    COLLECTED:  09/24/2015 04:31  ANTIBIOTICS AT COLL.:                                RECEIVED :  09/29/2015 09:00  [id number] called Micro Results of Positive Blood Cultures. Read back by:  [id number], by [id number] on 10/15/2015 at 18:03  Culture Blood Aerobic and Anaerobic        PRELIM      10/04/2015 19:32   +  10/10/2015   The amount of blood collected with this order is less than             the recommended volume (8-5mL per bottle). Collection of low  volumes may adversely affect recovery and/or detection time             of pathogens.             Aerobic and Anaerobic Blood Culture Positive in less than 24 hrs             Gram Stain Shows: Gram negative rods             Further workup to follow including susceptibility testing      Blood Culture Aerobic/Anaerobic #1 [[id number]] Collected:  10-09-2015 0431    Specimen Information:  Arm from Blood Updated:  October 09, 2015 1802    Narrative:      [id number] called Micro Results of Positive Blood Culture. Read back by:  [id number],  by [id number] on October 09, 2015 at 18:02  ORDER#: [id number]                                    ORDERED BY: LAL, ASHISH  SOURCE: Blood arm                                    COLLECTED:  2015-10-09 04:31  ANTIBIOTICS AT COLL.:                                RECEIVED :  2015/10/09 09:00  [id number] called Micro Results of Positive Blood Culture. Read back by:  [id number], by [id number] on 2015-10-09 at 18:02  Culture Blood Aerobic and Anaerobic        PRELIM      10-09-2015 18:02   +  09-Oct-2015   Aerobic and Anaerobic Blood Culture Positive in less than 24 hrs             Gram Stain Shows: Gram negative rods             Further workup to follow including susceptibility testing      Urine culture [[id number]] Collected:  09-Oct-2015 0754    Specimen Information:  Urine from Urine, Catheterized, Foley Updated:  09-Oct-2015 1557    Basic Metabolic Panel [[id number]]  (Abnormal) Collected:  10/09/2015 1423    Specimen Information:  Blood  Updated:  10-09-2015 1504     Glucose 115 (H) mg/dL      BUN 38.7 (H) mg/dL      Creatinine 1.3 mg/dL      Calcium 7.3 (L) mg/dL      Sodium 564 mEq/L      Potassium 4.2 mEq/L      Chloride 112 (H) mEq/L      CO2 17 (L) mEq/L     Narrative:      Patient received 20 of KCL IV in ER. Call if K is still low    GFR [[id number]] Collected:  Oct 09, 2015 1423     EGFR 53.2 Updated:  Oct 09, 2015 1504    Narrative:      Patient received 20 of KCL IV in ER. Call if K is still low    Lactic acid, plasma [[id number]]  (Abnormal) Collected:  10/09/15 1423    Specimen Information:  Blood Updated:  October 09, 2015 1448     Lactic acid 2.7 (H) mmol/L            Radiology Results (24 Hour)     Procedure Component Value  Units Date/Time    XR Chest AP Portable [[id number]] Collected:  10/06/2015 1214    Order Status:  Completed Updated:  10/16/2015 1221    Narrative:      CLINICAL INFORMATION: 81 year old. Admitted from the ER earlier today  with diagnoses of hypertension, altered mental status, and fever.  Question pneumonia.    Technique and findings: Semiupright portable chest at 1212 hours.  Comparison with 0508 hours.    Suboptimal inspiration. Minimal subsegmental atelectasis right lung  base. Airspace opacity at the left lung increasing compared with the  prior study. It could be due to atelectasis or pneumonia. Blunted left  lateral costophrenic angle suggesting a small left pleural effusion.  Stable heart size. No overt CHF. No pneumothorax.      Impression:        1. Low lung volumes.  2. Minimal subsegmental atelectasis right lung base.  3. Left basilar opacity may be due to atelectasis or pneumonia. Possible  small left pleural effusion.    Annabell Sabal, MD   09/29/2015 12:17 PM                  Assessment/ Plan     Patient Active Problem List   Diagnosis   . Hypotension     Septic shock secondary to urinary tract infection and possible PNA, Full comfort with MS drip and no IV Abx or other life sustaining measures per pt adn family's wish.  Actively dying  Comfortable.     Encephalopathy, toxic metabolic secondary to septic shock    Hypoxic respiratory failure,    Lactic acidosis secondary to above    Anemia,     Thrombocytopenia, possibly sepsis related.    Severe malnutrition          Signed by: Karma Ganja, MD    [facility] contact:    [facility] Babinski [facility]:  [phone]      [facility]:    Tower NP (M-F 8am-5pm) [phone]  [facility] MD (M-F 8am-5pm)  [phone]    [facility] NP (M-F 8am-5pm) [phone]  [facility] MD (M-F 8am-5pm) [phone]    24 hour answering service M-F 5pm-8am, Saturday and Sunday and if no answer on above numbers, [phone], please request attending physician covering patient that day

## 2015-10-08 NOTE — Plan of Care (Addendum)
Problem: Psychosocial and Spiritual Needs  Goal: Demonstrates ability to cope with hospitalization/illness  Outcome: Progressing  Pt arrived on unit accompanied by son Patrick,pt unresponsive,already on Morphine drip.Family aware of expectations.Offered support.    Problem: Dyspnea  Goal: Dyspnea is at a manageable effort  Outcome: Progressing  Pt on non-rebreather face mask.Agonal breathing.    Comments:   Pt arrived on unit with drip infusing at 4mg /hr.With charge RN ,adjusted rate to 1mg /hr and call to Dr Hermenia Bers to clarify order.Charge RN stated NP called back later and will not change order.Pt comfortable on 1mg /hr.    Pt's son left phone number and mother's number and states Madolyn Frieze home in [facility] to be notified when father passes.All information noted.Pt comfortable.        No changes,on tele with HR 40 - 50's.Call to son to update him on father.

## 2015-10-08 NOTE — UM Notes (Addendum)
Admit to inpatient:  04-Nov-2015 1610  Transfer from  [facility] after an episode of unresponsiveness prior to arrival.  On admission, was hypoxic, febrile currently in the ER is on a nonrebreather mask  Septic shock secondary to urinary tract infection, currently on IV antibiotic    99.3, 79, 18, 87/52 dropping to 74/45    Family request for full comfort care, abx stopped    Admitted Trauma Acute Care  unit: VS q 4  hr, Telemetry and pulse oximetry: Solumedrol 125 mg iv x1, Zosyn 4.5 gm iv x 1, Vanc 2 gm iv x 1,  NS Bolus x 3L. Started on Morphine drip.      10/23  Non responsive, on morphine drip. Originally at 1mg /hr, MD made aware RN cannot titrate drip on this floor, new order to place morphine at 2mg /hr

## 2015-10-08 NOTE — Plan of Care (Signed)
Problem: Safety  Goal: Patient will be free from injury during hospitalization  Outcome: Progressing  Comfort care patient still on telemetry.  ST and then converted into ATrial Fib at approx 2245.  Unconscious and agonal breathing.  22 to 24 BPM.  Turned and repositioned. No bm or urine this shift.

## 2015-10-08 NOTE — Plan of Care (Addendum)
Pt non responsive, on morphine drip. Originally at 1mg /hr, MD made aware RN cannot titrate drip on this floor, new order to place morphine at 2mg /hr. Pt seems more comfortable. Wife called and updated by this RN. Family wants vitals still to be taken and tele on. Pt has foley in place. Will continue to monitor pt.

## 2015-10-08 NOTE — Plan of Care (Signed)
Problem: Communication  Goal: Effective communication  Outcome: Not Progressing  APtients BP 67/39 at this taking.  Patients RR 23 at this low BP and patient converted to afib per tele monitoring dept. Uncontrolled rate 120-130.

## 2015-10-09 NOTE — Plan of Care (Signed)
Problem: Pain  Goal: Patient's pain/discomfort is manageable  Pt unresponsive to verbal, tactile stimuli. Morphine gtt @2ml /hr increased to 2.5 ml/hr for labored respiration. No further signs of discomfort noted. Oral care given. Turned & repositioned. Foley draining minimal dark urine, no BM noted. Wife & son updated via telephone. Fall precautions reinforced, will continue to monitor frequently.

## 2015-10-09 NOTE — Progress Notes (Signed)
[facility], [facility]  Answering service [phone]  Progress Note    Date Time: 10/09/2015 10:00 AM  Patient Name: [name] [name],[name] [name]  Attending Physician: Karma Ganja, MD      Subjective:   Patient Seen and Examined. The notes from the last 24 hours were reviewed. Pt comfortable on MS drip    Review of Systems:    Unable to obtain      Physical Exam:     Filed Vitals:    10/09/15 0700   BP:    Pulse:    Temp:    Resp: 22   SpO2:    BP 63/47  PR 119  T 98      Intake and Output Summary (Last 24 hours) at Date Time  No intake or output data in the 24 hours ending 10/09/15 1000    General: no acute distress.  HEENT: Normocephalic. No icter or congestion  Neck: supple, no lymphadenopathy, no thyromegaly, no JVD, no carotid bruits  Cardiovascular: S1, S2 regular rhythm and rate, no murmurs, rubs or gallops  Lungs: Bilateral breath sounds, clear to auscultation bilaterally, No wheezing, rhonchi, or rales  Abdomen: soft, non-tender, non-distended; no palpable masses,  no rebound or guarding  Extremities: no clubbing, cyanosis, or edema  Neuro:sleep  Skin: no rashes or lesions noted      Meds:     Scheduled Meds:  Current Facility-Administered Medications   Medication Dose Route Frequency     Continuous Infusions:  . morphine 2.5 mg/hr (10/09/15 0816)     PRN Meds:.albuterol-ipratropium, naloxone, ondansetron **OR** ondansetron  I personally reviewed all of the medications        Labs:     Results     Procedure Component Value Units Date/Time    Urine culture [[id number]] Collected:  10/08/2015 0754    Specimen Information:  Urine from Urine, Catheterized, Foley Updated:  10/08/15 1818    Narrative:      ORDER#: [id number]                                    ORDERED BY: LAL, ASHISH  SOURCE: Urine, Catheterized, Foley                   COLLECTED:  10/09/2015 07:54  ANTIBIOTICS AT COLL.:                                RECEIVED :  09/28/2015 15:57  Culture Urine                              PRELIM       10/08/15 18:18   +  10/08/15   >100,000 CFU/ML Escherichia coli               Further workup to follow including susceptibility testing        Blood Culture Aerobic/Anaerobic #1 [[id number]] Collected:  10/13/2015 0431    Specimen Information:  Arm from Blood Updated:  10/08/15 1647    Narrative:      [id number] called Micro Results of Positive Blood Culture. Read back by:  [id number],  by [id number] on 09/18/2015 at 18:02  ORDER#: [id number]  ORDERED BY: LAL, ASHISH  SOURCE: Blood arm                                    COLLECTED:  10/27/2015 04:31  ANTIBIOTICS AT COLL.:                                RECEIVED :  2015-10-27 09:00  [id number] called Micro Results of Positive Blood Culture. Read back by:  [id number], by [id number] on 10-27-2015 at 18:02  Culture Blood Aerobic and Anaerobic        PRELIM      10/08/15 16:47   +  Oct 27, 2015   Aerobic and Anaerobic Blood Culture Positive in less than 24 hrs             Gram Stain Shows: Gram negative rods             Further workup to follow including susceptibility testing  10/08/15   Growth of Escherichia coli               Further workup to follow including susceptibility testing        Blood Culture Aerobic/Anaerobic #2 [[id number]] Collected:  2015-10-27 0431    Specimen Information:  Arm from Blood Updated:  10/08/15 1323    Narrative:      [id number] called Micro Results of Positive Blood Cultures. Read back by:  [id number], by [id number] on 2015/10/27 at 18:03  ORDER#: [id number]                                    ORDERED BY: LAL, ASHISH  SOURCE: Blood arm                                    COLLECTED:  2015-10-27 04:31  ANTIBIOTICS AT COLL.:                                RECEIVED :  10-27-15 09:00  [id number] called Micro Results of Positive Blood Cultures. Read back by:  [id number], by [id number] on Oct 27, 2015 at 18:03  Culture Blood Aerobic and Anaerobic        PRELIM      10/08/15 13:23   +  10-27-15   The amount of blood collected with this order is less than             the recommended  volume (8-14mL per bottle). Collection of low             volumes may adversely affect recovery and/or detection time             of pathogens.             Aerobic and Anaerobic Blood Culture Positive in less than 24 hrs             Gram Stain Shows: Gram negative rods             Further workup to follow including susceptibility testing  10/08/15   Growth of Gram negative rod                 Radiology Results (24 Hour)     **  No results found for the last 24 hours. **                Assessment/ Plan     Patient Active Problem List   Diagnosis   . Hypotension     Septic shock secondary to urinary tract infection and possible PNA, Full comfort with MS drip and no IV Abx or other life sustaining measures per pt and family's wish. Actively dying  Comfortable.     Encephalopathy, toxic metabolic secondary to septic shock    Hypoxic respiratory failure,    Lactic acidosis secondary to above    Anemia,     Thrombocytopenia, possibly sepsis related.    Severe malnutrition          Signed by: Karma Ganja, MD    [facility] contact:    [facility]yson Babinski [facility]:  [phone]      [facility]:    Tower NP (M-F 8am-5pm) [phone]  Tower MD (M-F 8am-5pm)  [phone]    [facility] NP (M-F 8am-5pm) [phone]  [facility] MD (M-F 8am-5pm) [phone]    24 hour answering service M-F 5pm-8am, Saturday and Sunday and if no answer on above numbers, [phone], please request attending physician covering patient that day

## 2015-10-10 NOTE — Progress Notes (Signed)
[facility], [facility]  Answering service [phone]  Progress Note    Date Time: 10/10/2015 10:40 AM  Patient Name: [name] [name],[name] [name]  Attending Physician: Karma Ganja, MD      Subjective:   Patient Seen and Examined. The notes from the last 24 hours were reviewed. Pt comfortable on MS drip    Review of Systems:    Unable to obtain      Physical Exam:     Filed Vitals:    10/10/15 0712   BP:    Pulse:    Temp:    Resp: 20   SpO2:          Intake and Output Summary (Last 24 hours) at Date Time    Intake/Output Summary (Last 24 hours) at 10/10/15 1040  Last data filed at 10/10/15 0540   Gross per 24 hour   Intake      0 ml   Output    350 ml   Net   -350 ml       General: no acute distress.  HEENT: Normocephalic. No icter or congestion  Neck: supple, no lymphadenopathy, no thyromegaly, no JVD, no carotid bruits  Cardiovascular: S1, S2 regular rhythm and rate, no murmurs, rubs or gallops  Lungs: Bilateral breath sounds, clear to auscultation bilaterally, No wheezing, rhonchi, or rales  Abdomen: soft, non-tender, non-distended; no palpable masses,  no rebound or guarding  Extremities: no clubbing, cyanosis, or edema  Neuro:sleep  Skin: no rashes or lesions noted      Meds:     Scheduled Meds:  Current Facility-Administered Medications   Medication Dose Route Frequency     Continuous Infusions:  . morphine 2.5 mg/hr (10/09/15 0816)     PRN Meds:.albuterol-ipratropium, naloxone, ondansetron **OR** ondansetron  I personally reviewed all of the medications        Labs:     Results     Procedure Component Value Units Date/Time    OUTSIDE LAB SCAN [[id number]] Resulted:  10/10/15 0248     Updated:  10/10/15 0248    Blood Culture Aerobic/Anaerobic #2 [[id number]] Collected:  10-22-15 0431    Specimen Information:  Arm from Blood Updated:  10/09/15 1522    Narrative:      [id number] called Micro Results of Positive Blood Cultures. Read back by:  [id number], by [id number] on 10/22/2015 at 18:03  ORDER#: [id number]                                     ORDERED BY: LAL, ASHISH  SOURCE: Blood arm                                    COLLECTED:  10/22/15 04:31  ANTIBIOTICS AT COLL.:                                RECEIVED :  22-Oct-2015 09:00  [id number] called Micro Results of Positive Blood Cultures. Read back by:  [id number], by [id number] on 2015/10/22 at 18:03  Culture Blood Aerobic and Anaerobic        FINAL       10/09/15 15:22   +  2015-10-22   The amount of blood collected with this order is less than  the recommended volume (8-91mL per bottle). Collection of low             volumes may adversely affect recovery and/or detection time             of pathogens.             Aerobic and Anaerobic Blood Culture Positive in less than 24 hrs             Gram Stain Shows: Gram negative rods  10/09/15   Growth of Escherichia coli               Refer susceptibilities on culture #[id number]        Blood Culture Aerobic/Anaerobic #1 [[id number]] Collected:  10/03/2015 0431    Specimen Information:  Arm from Blood Updated:  10/09/15 1519    Narrative:      [id number] called Micro Results of Positive Blood Culture. Read back by:  [id number],  by [id number] on 10/02/2015 at 18:02  ORDER#: [id number]                                    ORDERED BY: LAL, ASHISH  SOURCE: Blood arm                                    COLLECTED:  09/27/2015 04:31  ANTIBIOTICS AT COLL.:                                RECEIVED :  10/10/2015 09:00  [id number] called Micro Results of Positive Blood Culture. Read back by:  [id number], by [id number] on 10/04/2015 at 18:02  Culture Blood Aerobic and Anaerobic        FINAL       10/09/15 15:19   +  10/12/2015   Aerobic and Anaerobic Blood Culture Positive in less than 24 hrs             Gram Stain Shows: Gram negative rods  10/09/15   Growth of Escherichia coli      _____________________________________________________________________________                                     E.coli       ANTIBIOTICS                     MIC  INTRP       _____________________________________________________________________________  Amoxicillin/CA                 <=4/2   S        Ampicillin                      <=4    S        Aztreonam                       <=2    S        Cefazolin                        4     I        Cefepime                        <=  1    S        Ceftazidime                     <=2    S        Ceftriaxone                     <=1    S        Ciprofloxacin                  <=0.5   S        Ertapenem                     <=0.25   S        Gentamicin                      <=2    S        Levofloxacin                    <=1    S        Meropenem                     <=0.25   S        Piperacillin/Tazobactam        <=2/4   S        Trimethoprim/Sulfamethoxazole <=0.5/9  S        _____________________________________________________________________________            S=SUSCEPTIBLE     I=INTERMEDIATE     R=RESISTANT                            N/S=NON-SUSCEPTIBLE  _____________________________________________________________________________      Urine culture [[id number]] Collected:  10/12/2015 0754    Specimen Information:  Urine from Urine, Catheterized, Foley Updated:  10/09/15 1455    Narrative:      ORDER#: [id number]                                    ORDERED BY: LAL, ASHISH  SOURCE: Urine, Catheterized, Foley                   COLLECTED:  10/12/2015 07:54  ANTIBIOTICS AT COLL.:                                RECEIVED :  09/22/2015 15:57  Culture Urine                              FINAL       10/09/15 14:55   +  10/09/15   >100,000 CFU/ML Escherichia coli               Cefazolin results predict results for the oral agents             cefuroxime axetil, cephalexin, cefdinir, cefpodoxime, and             cefprozil when used for therapy of uncomplicated UTIs due to             E.coli, K. pneumonia, and P. mirabilis. CLSI M100 S25  _____________________________________________________________________________                                     E.coli        ANTIBIOTICS                     MIC  INTRP      _____________________________________________________________________________  Amoxicillin/CA                 <=4/2   S        Ampicillin                      <=4    S        Aztreonam                       <=2    S        Cefazolin                        2     S        Cefepime                        <=1    S        Ceftazidime                     <=2    S        Ceftriaxone                     <=1    S        Ciprofloxacin                  <=0.5   S        Ertapenem                     <=0.25   S        Gentamicin                      <=2    S        Levofloxacin                    <=1    S        Meropenem                     <=0.25   S        Nitrofurantoin                  32     S  D1    Piperacillin/Tazobactam        <=2/4   S        Tetracycline                    <=2    S        Trimethoprim/Sulfamethoxazole <=0.5/9  S          -----DRUG COMMENTS----------    D1:  Nitrofurantoin should only be used for the treatment of         uncomplicated cystitis.         [facility]  Antimicrobial Subcommittee June 2015  _____________________________________________________________________________            S=SUSCEPTIBLE     I=INTERMEDIATE     R=RESISTANT                            N/S=NON-SUSCEPTIBLE  _____________________________________________________________________________             Radiology Results (24 Hour)     ** No results found for the last 24 hours. **                Assessment/ Plan     Patient Active Problem List   Diagnosis   . Hypotension     Septic shock secondary to urinary tract infection and possible PNA, Full comfort with MS drip and no IV Abx or other life sustaining measures per pt and family's wish. Actively dying  Comfortable.     Encephalopathy, toxic metabolic secondary to septic shock    Hypoxic respiratory failure,    Lactic acidosis secondary to above    Anemia,     Thrombocytopenia, possibly sepsis related.    Severe  malnutrition          Signed by: Karma Ganja, MD    [facility] contact:    Tyson Babinski [facility]:  [phone]      [facility]:    Tower NP (M-F 8am-5pm) [phone]  [facility] MD (M-F 8am-5pm)  [phone]    [facility] NP (M-F 8am-5pm) [phone]  [facility] MD (M-F 8am-5pm) [phone]    24 hour answering service M-F 5pm-8am, Saturday and Sunday and if no answer on above numbers, [phone], please request attending physician covering patient that day

## 2015-10-10 NOTE — Progress Notes (Signed)
Maintaining comfort care. Morphine drip infusing @ 2.5 ml/hr. RR-23, Labored shallow breathing. Output - . No significant events noted. Will continue to follow plan of care.

## 2015-10-10 NOTE — Plan of Care (Signed)
Problem: Safety  Goal: Patient will be free from injury during hospitalization  Pt unresponsive, keeping comfortable. Morphine gtt @2 .5 ml/hr. Oral care given. Turned & repositioned with incontinence & foley care. Wife updated via telephone. Fall precautions reinforced, will continue to monitor frequently.

## 2015-10-11 MED ORDER — MORPHINE SULFATE 2 MG/ML IJ/IV SOLN (WRAP)
2.0000 mg | Status: DC | PRN
Start: 2015-10-11 — End: 2015-10-14
  Administered 2015-10-13 (×2): 2 mg via INTRAVENOUS
  Filled 2015-10-11 (×2): qty 1

## 2015-10-11 NOTE — Plan of Care (Signed)
Problem: Pain  Goal: Patient's pain/discomfort is manageable  Intervention: Include patient/family/caregiver in decisions related to pain management  RN assessment done patient is unresposive ,Respitory rate rage between 10-8,seemed comfortable with morphine drip 2.5 mg/hr,due care given will continue to monitor .

## 2015-10-11 NOTE — Progress Notes (Signed)
[facility], [facility]  Answering service [phone]  Progress Note    Date Time: 10/11/2015 12:04 PM  Patient Name: [name] [name]  Attending Physician: Luna Glasgow, DO      Subjective:   Patient Seen and Examined. The notes from the last 24 hours were reviewed. Discussed with patient's family member and nursing at length.    Review of Systems:    Aside from above, No headache. No Eyes, Ear, Nose, Throat problem. No chest pain, orthopnea or shortness or breath or cough, No abdominal pain, no nausea or vomiting. No pain or weakness in upper or lower extremity, no psychiatric, neurological, endocrine or hematological complaints other than above.        Physical Exam:     Filed Vitals:    10/11/15 0816   BP:    Pulse:    Temp:    Resp: 20   SpO2:        Intake and Output Summary (Last 24 hours) at Date Time    Intake/Output Summary (Last 24 hours) at 10/11/15 1204  Last data filed at 10/11/15 1019   Gross per 24 hour   Intake      0 ml   Output   1525 ml   Net  -1525 ml       General: Awake, no acute distress.  HEENT: Normocephalic. No icter or congestion  Neck: supple, no lymphadenopathy, no thyromegaly, no JVD, no carotid bruits  Cardiovascular: S1, S2 regular rhythm and rate, no murmurs, rubs or gallops  Lungs: Bilateral breath sounds, clear to auscultation bilaterally, No wheezing, rhonchi, or rales  Abdomen: soft, non-tender, non-distended; no palpable masses,  no rebound or guarding  Extremities: no clubbing, cyanosis, or edema  Neuro: Exam grossly  non focal  Skin: no rashes or lesions noted      Meds:     Scheduled Meds:  Current Facility-Administered Medications   Medication Dose Route Frequency     Continuous Infusions:  . morphine 2.5 mg/hr (10/11/15 0106)     PRN Meds:.albuterol-ipratropium, naloxone, ondansetron **OR** ondansetron  I personally reviewed all of the medications        Labs:     Results     ** No results found for the last 24 hours. **           Radiology Results  (24 Hour)     ** No results found for the last 24 hours. **        Imaging personally reviewed, including:   ECG, tele personally reviewed, including     Assessment/ Plan     Patient Active Problem List   Diagnosis   . Hypotension             Septic shock secondary to urinary tract infection and possible PNA--  Patient is currently under full comfort care with morphine sulfate trip and no IV antibiotic, sore other life-sustaining measures at the request of the family and patient's wishes.  Patient is actively dying and comfortable.    Encephalopathy--  toxic metabolic secondary to septic shock    Hypoxic respiratory failure    Lactic acidosis secondary to above    Anemia    Thrombocytopenia--  possibly sepsis related.    Severe malnutrition    Continue comfort care    Disposition-  Patient is full comfort care    Signed by: Luna Glasgow, DO    [facility] contact:    [facility]:  [facility]:    [facility] NP (M-F 8am-5pm) [phone]  [facility] MD (M-F 8am-5pm)  [phone]    [facility] NP (M-F 8am-5pm) [phone]  [facility] MD (M-F 8am-5pm) [phone]    24 hour answering service M-F 5pm-8am, Saturday and Sunday and if no answer on above numbers, [phone], please request attending physician covering patient that day

## 2015-10-11 NOTE — Progress Notes (Signed)
Pt unresponsive, flaccid extremities. Pt seems comfortable. Morphine gtt @2 .5 ml/hr. Turned & repositioned with incontinence & foley care. Fall precautions reinforced, will continue to monitor frequently.

## 2015-10-12 NOTE — Progress Notes (Signed)
[facility], [facility]  Answering service [phone]  Progress Note    Date Time: 10/12/2015 12:46 PM  Patient Name: [name] [name],[name] [name]  Attending Physician: Luna Glasgow, DO      Subjective:   Patient Seen and Examined. The notes from the last 24 hours were reviewed. Discussed with patient's family member and nursing at length.    Review of Systems:    Aside from above, No headache. No Eyes, Ear, Nose, Throat problem. No chest pain, orthopnea or shortness or breath or cough, No abdominal pain, no nausea or vomiting. No pain or weakness in upper or lower extremity, no psychiatric, neurological, endocrine or hematological complaints other than above.        Physical Exam:     Filed Vitals:    10/12/15 1151   BP: 80/51   Pulse: 114   Temp: 98.5 F (36.9 C)   Resp: 24   SpO2: 91%       Intake and Output Summary (Last 24 hours) at Date Time    Intake/Output Summary (Last 24 hours) at 10/12/15 1246  Last data filed at 10/12/15 0981   Gross per 24 hour   Intake      0 ml   Output   1600 ml   Net  -1600 ml       General: Awake, no acute distress.  HEENT: Normocephalic. No icter or congestion  Neck: supple, no lymphadenopathy, no thyromegaly, no JVD, no carotid bruits  Cardiovascular: S1, S2 regular rhythm and rate, no murmurs, rubs or gallops  Lungs: Bilateral breath sounds, clear to auscultation bilaterally, No wheezing, rhonchi, or rales  Abdomen: soft, non-tender, non-distended; no palpable masses,  no rebound or guarding  Extremities: no clubbing, cyanosis, or edema  Neuro: Exam grossly  non focal  Skin: no rashes or lesions noted      Meds:     Scheduled Meds:  Current Facility-Administered Medications   Medication Dose Route Frequency     Continuous Infusions:  . morphine 2.5 mg/hr (10/11/15 0106)     PRN Meds:.albuterol-ipratropium, morphine, naloxone, ondansetron **OR** ondansetron  I personally reviewed all of the medications        Labs:     Results     ** No results found for the last 24  hours. **           Radiology Results (24 Hour)     ** No results found for the last 24 hours. **        Imaging personally reviewed, including:   ECG, tele personally reviewed, including     Assessment/ Plan     Patient Active Problem List   Diagnosis   . Hypotension             Septic shock secondary to urinary tract infection and possible PNA--  Patient is currently under full comfort care with morphine sulfate trip and no IV antibiotic, sore other life-sustaining measures at the request of the family and patient's wishes.  Patient is actively dying and comfortable.  Hospice called to give input.    Encephalopathy--  toxic metabolic secondary to septic shock    Hypoxic respiratory failure    Lactic acidosis secondary to above    Anemia    Thrombocytopenia--  possibly sepsis related.    Severe malnutrition    Continue comfort care    Disposition-  [facility]: Hospice: New.  Informational visit .  Hospitalist team to follow up.  No family at bedside  in the evening.  Mother information for contacting, son information for contacting funeral home arrangements.  Madie Reno to me with patient tomorrow, 10/13/2015 hospice coordinator.  Patient currently on morphine drip.  Patient is full comfort care    Signed by: Luna Glasgow, DO    [facility] contact:    [facility]:  [phone]      [facility]:    [facility] NP (M-F 8am-5pm) [phone]  [facility] MD (M-F 8am-5pm)  [phone]    [facility] NP (M-F 8am-5pm) [phone]  [facility] MD (M-F 8am-5pm) [phone]    24 hour answering service M-F 5pm-8am, Saturday and Sunday and if no answer on above numbers, [phone], please request attending physician covering patient that day

## 2015-10-12 NOTE — Progress Notes (Signed)
Pt unresponsive and continues on comfort care. Morphine drip continues to administer at 2.5 ml/hr. Foley remains intact. Urine output total during shift: 800 ml. Respirations: 20. Will continue to monitor.

## 2015-10-12 NOTE — Progress Notes (Signed)
[facility] will meet with patient tomorrow Friday 10/13/15.    [name] [name]  Hospice Coordinator  [phone] 24Hr. [phone]

## 2015-10-12 NOTE — Progress Notes (Addendum)
[facility]:  HOSPICE:  NEW    NEW Hospice Informational visit request from Dr. Adriana Simas.  Order is in EPIC.   Hospice team will follow-up; no family at bedside this evening.    CONTACT INFORMATION:     1) [phone] call first when pt expires,then Luisa Hart : 2) Son Luisa Hart [phone]    FUNERAL HOME ARRANGEMENTS:  Will be handled by [facility] per chart review    Fara Chute, RN BSN - 24 HR # [phone]

## 2015-10-12 NOTE — Plan of Care (Signed)
Problem: Pain  Goal: Patient's pain/discomfort is manageable  Outcome: Progressing  Pt continues on comfort care. Morphine drip continues to administer.

## 2015-10-12 NOTE — Plan of Care (Signed)
Pt status per charted assessment. Pt on comfort care, running 2.5 mg/hr of morphine, foley in place, foley care completed this shift, shower cap used this shift. Pt breathing stayed at 16-17 respirations per minute this shift. Safety precautions in place, will continue to monitor.

## 2015-10-13 MED ORDER — LORAZEPAM 2 MG/ML IJ SOLN
0.5000 mg | INTRAMUSCULAR | Status: DC
Start: 2015-10-13 — End: 2015-10-14
  Administered 2015-10-13 (×5): 0.5 mg via INTRAVENOUS
  Filled 2015-10-13 (×5): qty 1

## 2015-10-13 NOTE — Plan of Care (Incomplete)
Pt status unchanged from previous shift. Bath given, oral care performed. Morphine continues to run 2.5 mg/hr. Urine output this shift:  . Will continue to monitor.

## 2015-10-13 NOTE — Progress Notes (Signed)
[facility], [facility]  Answering service [phone]  Progress Note    Date Time: 10/13/2015 6:55 PM  Patient Name: [name] [name],[name] [name]  Attending Physician: Luna Glasgow, DO      Subjective:   Patient Seen and Examined. The notes from the last 24 hours were reviewed. Discussed with patient's family member and nursing at length.    Review of Systems:    Aside from above, No headache. No Eyes, Ear, Nose, Throat problem. No chest pain, orthopnea or shortness or breath or cough, No abdominal pain, no nausea or vomiting. No pain or weakness in upper or lower extremity, no psychiatric, neurological, endocrine or hematological complaints other than above.        Physical Exam:     Filed Vitals:    10/13/15 1801   BP:    Pulse:    Temp:    Resp: 22   SpO2:        Intake and Output Summary (Last 24 hours) at Date Time    Intake/Output Summary (Last 24 hours) at 10/13/15 1855  Last data filed at 10/13/15 1800   Gross per 24 hour   Intake      0 ml   Output   1100 ml   Net  -1100 ml       General: Awake, no acute distress.  HEENT: Normocephalic. No icter or congestion  Neck: supple, no lymphadenopathy, no thyromegaly, no JVD, no carotid bruits  Cardiovascular: S1, S2 regular rhythm and rate, no murmurs, rubs or gallops  Lungs: Bilateral breath sounds, clear to auscultation bilaterally, No wheezing, rhonchi, or rales  Abdomen: soft, non-tender, non-distended; no palpable masses,  no rebound or guarding  Extremities: no clubbing, cyanosis, or edema  Neuro: Exam grossly  non focal  Skin: no rashes or lesions noted      Meds:     Scheduled Meds:  Current Facility-Administered Medications   Medication Dose Route Frequency   . LORazepam  0.5 mg Intravenous Q2H     Continuous Infusions:  . morphine 4 mg/hr (10/13/15 1226)     PRN Meds:.albuterol-ipratropium, morphine, naloxone, ondansetron **OR** ondansetron  I personally reviewed all of the medications        Labs:     Results     ** No results found for the  last 24 hours. **           Radiology Results (24 Hour)     ** No results found for the last 24 hours. **        Imaging personally reviewed, including:   ECG, tele personally reviewed, including     Assessment/ Plan     Patient Active Problem List   Diagnosis   . Hypotension             Septic shock secondary to urinary tract infection and possible PNA--  Patient is currently under full comfort care with morphine sulfate trip and no IV antibiotic, sore other life-sustaining measures at the request of the family and patient's wishes.  Patient is actively dying and comfortable.  [facility] called family, and they refused services.    Encephalopathy--  toxic metabolic secondary to septic shock    Hypoxic respiratory failure    Lactic acidosis secondary to above    Anemia    Thrombocytopenia--  possibly sepsis related.    Severe malnutrition    Continue comfort care    Disposition-  [facility] spoke with spouse and inquired if she is  going to be coming to the hospital.  She stated that she just wanted him to die and for no one else pain involved.  The doctor informed her that he was on a morphine drip and she hoped that he would not suffer, but just wanted him to die here.  They asked that [employer] , not involved.  They gently explained the services and she refused.  Will only reach out to the family if they are called by them.    Signed by: Luna Glasgow, DO    [facility] contact:    [facility]:  [phone]      [facility]:    [facility] NP (M-F 8am-5pm) [phone]  [facility] MD (M-F 8am-5pm)  [phone]    [facility] NP (M-F 8am-5pm) [phone]  [facility] MD (M-F 8am-5pm) [phone]    24 hour answering service M-F 5pm-8am, Saturday and Sunday and if no answer on above numbers, [phone], please request attending physician covering patient that day

## 2015-10-13 NOTE — Progress Notes (Signed)
[facility] called and spoke with patient spouse to inquire if she was coming to the hospital today so we could speak regarding possible inpatient hospice for her husband ( patient).  She stated she" just wanted him to die and for no one else to be involved". The doctor informed her that he was on "morphine and she hoped he would not suffer but just wanted him to die here".  Asked that we were not involved. Briefly and gently explained our inpatient hospice and she refused. We will not reach out to the family unless we are asked to specifically or called by them      [facility] can be reached 24 hours [phone]

## 2015-10-13 NOTE — Progress Notes (Signed)
Pt unresponsive and continues on comfort care. Morphine drip increased  to 4 ml/hr. Pt receives 0.5 mg Ativan every 2 hours. Foley remains intact. Urine output total during shift:0 ml. Respirations: 20. Will continue to monitor.

## 2015-10-13 NOTE — Plan of Care (Signed)
Problem: Safety  Goal: Patient will be free from injury during hospitalization  Outcome: Progressing  Fall precautions maintained. Will continue to monitor.

## 2015-10-17 NOTE — Plan of Care (Signed)
Problem: Communication  Goal: Effective communication  Outcome: Completed Date Met:  2015-11-08  Patient passed away at 0241 official time pronounced by the house MD. Documentation and calls to family made.  Patient will be brought to morgue .  Family has chosen [employer] [facility] in [place] as the receiving facility.

## 2015-10-17 NOTE — Significant Event (Signed)
Death Note:  I was asked to pronounce [name] [name].  He is a 81 year-old male with septic shock, encephalopathy and hypoxic respiratory failure.   Upon my arrival, the patient was not responsive , he had no corneal or pupillary reflexes.  He had no heart or lung sounds. He had no palpable carotid pulses.   He was pronounced dead at 2:41 am , Oct 25, 2015 .   Next of kin has been notified by charge RN.     Lysle Rubens, MD  House MD

## 2015-10-17 DEATH — deceased

## 2015-11-16 NOTE — Death Summary (Signed)
[facility], [facility]  Answering service [phone]  Death Summary      Date Time: 2015/11/22 9:17 PM  Patient Name: [name] [name],[name] [name]  Attending Physician: No att. providers found  Primary Care Physician: Kerin Perna, DO    Date of Admission:   10/08/2015    Date of Discharge:   11/11/15    Discharge Dx:     Patient Active Problem List    Diagnosis Date Noted   . Hypotension 10/10/2015    Septic shock   Urinary tract infection   Possible pneumonia   Encephalopathy    Hypoxic respiratory failure   Lactic acidosis   Anemia   Thrombocytopenia   Severe malnutrition   Deceased     Consultations:   Treatment Team: Nurse Practitioner: Auburn Bilberry, FNP; Case Manager: Marty Heck; Registered Nurse: Mallie Darting, RN     None    Procedures/Radiology performed:     Results     ** No results found for the last 24 hours. **            Hospital Course:   [name] [name] is a 81 y.o. male transferred from [facility] after an episode of unresponsiveness prior to arrival. On admission, was hypoxic, febrile currently in the ER is on a nonrebreather mask      Septic shock secondary to urinary tract infection and possible PNA.  Patient is currently under full comfort care with morphine sulfate trip and no IV antibiotic, sore other life-sustaining measures at the request of the family and patient's wishes. Patient is actively dying and comfortable. [facility] called family, and they refused services.  Encephalopathy toxic metabolic secondary to septic shock.  Hypoxic respiratory failure.  Lactic acidosis secondary to above.  Anemia.  Thrombocytopenia possibly sepsis related.  Severe malnutrition.  Continue comfort care.  [facility] spoke with spouse and inquired if she is going to be coming to the hospital. She stated that she just wanted him to die and for no one else pain involved. The doctor informed her that he was on a morphine drip and she hoped that he would not  suffer, but just wanted him to die here. They asked that [employer] , not involved. They gently explained the services and she refused. Will only reach out to the family if they are called by them.Patient passed away at 0241 official time pronounced by the house MD. Documentation and calls to family made. Patient will be brought to morgue . Family has chosen [employer] [facility] in [place] as the receiving facility.     DISCHARGE DAY EXAM:  Filed Vitals:    10/13/15 2348   BP:    Pulse:    Temp:    Resp: 23   SpO2:      General: awake, alert, oriented x 3; no acute distress.  Cardiovascular: regular rate and rhythm, no murmurs, rubs or gallops  Lungs: clear to auscultation bilaterally, without wheezing, rhonchi, or rales  Abdomen: soft, non-tender, non-distended; no palpable masses, no hepatosplenomegaly, normoactive bowel sounds, no rebound or guarding  Extremities: no clubbing, cyanosis, or edema  Other:     Discharge Medications:     Discharge Medication List as of 09/17/2015  1:11 PM      CONTINUE these medications which have NOT CHANGED    Details   amLODIPine (NORVASC) 5 MG tablet Take 5 mg by mouth daily., Until Discontinued, Historical Med      atorvastatin (LIPITOR) 10  MG tablet Take 10 mg by mouth daily., Until Discontinued, Historical Med      carvedilol (COREG) 12.5 MG tablet Take 12.5 mg by mouth., Until Discontinued, Historical Med      losartan (COZAAR) 100 MG tablet Take 100 mg by mouth daily., Until Discontinued, Historical Med      tamsulosin (FLOMAX) 0.4 MG Cap Take 0.4 mg by mouth Daily after dinner., Until Discontinued, Historical Med      aspirin EC 81 MG EC tablet Take 81 mg by mouth daily., Until Discontinued, Historical Med      senna (SENOKOT) 8.6 MG tablet Take 1 tablet by mouth daily., Until Discontinued, Historical Med               Discharge Instructions:       Disposition:  Deceased     Behiri, Amr H, DO              Signed by: Luna Glasgow, DO    CC: Behiri, Amr H,  DO

## 2016-01-29 MED FILL — METOPROLOL TARTRATE 50 MG T: 50 | 90 days supply | Qty: 180 | Fill #1

## 2016-01-29 MED FILL — DILTIAZEM 24HR ER 180 MG CA: 180 | 90 days supply | Qty: 90 | Fill #1

## 2016-01-29 MED FILL — SIMVASTATIN 20 MG TABLET: 20 | 90 days supply | Qty: 90 | Fill #1

## 2016-03-07 MED FILL — ELIQUIS 5 MG TABLET: 5 | 90 days supply | Qty: 180 | Fill #2

## 2016-03-14 DIAGNOSIS — Z6831 Body mass index (BMI) 31.0-31.9, adult: Secondary | ICD-10-CM | POA: Diagnosis not present

## 2016-03-14 DIAGNOSIS — J069 Acute upper respiratory infection, unspecified: Secondary | ICD-10-CM | POA: Diagnosis not present

## 2016-03-14 DIAGNOSIS — E6609 Other obesity due to excess calories: Secondary | ICD-10-CM | POA: Diagnosis not present

## 2016-03-14 DIAGNOSIS — Z1389 Encounter for screening for other disorder: Secondary | ICD-10-CM | POA: Diagnosis not present

## 2016-03-14 MED FILL — AZITHROMYCIN 250 MG TABLET: 250 | 5 days supply | Qty: 6 | Fill #0

## 2016-04-23 MED FILL — METOPROLOL TARTRATE 50 MG T: 50 | 90 days supply | Qty: 180 | Fill #2

## 2016-04-23 MED FILL — CARTIA XT 180 MG CAPSULE SA: 180 | 90 days supply | Qty: 90 | Fill #2

## 2016-04-23 MED FILL — SIMVASTATIN 20 MG TABLET: 20 | 90 days supply | Qty: 90 | Fill #2

## 2016-04-24 ENCOUNTER — Ambulatory Visit (HOSPITAL_COMMUNITY)
Admission: RE | Admit: 2016-04-24 | Discharge: 2016-04-24 | Disposition: A | Discharge status: Home / Self Care | Payer: 59 | Source: Ambulatory Visit | Attending: Family Medicine | Admitting: Family Medicine

## 2016-04-24 ENCOUNTER — Other Ambulatory Visit (HOSPITAL_COMMUNITY): Payer: Self-pay | Admitting: Family Medicine

## 2016-04-24 DIAGNOSIS — J209 Acute bronchitis, unspecified: Secondary | ICD-10-CM | POA: Diagnosis not present

## 2016-04-24 DIAGNOSIS — Z6830 Body mass index (BMI) 30.0-30.9, adult: Secondary | ICD-10-CM | POA: Diagnosis not present

## 2016-04-24 DIAGNOSIS — J069 Acute upper respiratory infection, unspecified: Secondary | ICD-10-CM

## 2016-04-24 DIAGNOSIS — Z1389 Encounter for screening for other disorder: Secondary | ICD-10-CM | POA: Diagnosis not present

## 2016-04-24 DIAGNOSIS — R05 Cough: Secondary | ICD-10-CM | POA: Diagnosis not present

## 2016-04-24 MED FILL — PROAIR HFA 90 MCG INHALER: 108 (90 BAS | 30 days supply | Qty: 9 | Fill #0

## 2016-04-24 MED FILL — MOXIFLOXACIN HCL 400 MG TAB: 400 | 10 days supply | Qty: 10 | Fill #0

## 2016-05-01 DIAGNOSIS — R05 Cough: Secondary | ICD-10-CM | POA: Diagnosis not present

## 2016-05-01 DIAGNOSIS — J209 Acute bronchitis, unspecified: Secondary | ICD-10-CM | POA: Diagnosis not present

## 2016-05-01 DIAGNOSIS — Z6830 Body mass index (BMI) 30.0-30.9, adult: Secondary | ICD-10-CM | POA: Diagnosis not present

## 2016-05-01 DIAGNOSIS — Z1389 Encounter for screening for other disorder: Secondary | ICD-10-CM | POA: Diagnosis not present

## 2016-05-01 MED FILL — HYDROCODONE-CHLORPHENIRAM S: 10-8 | 12 days supply | Qty: 120 | Fill #0

## 2016-05-01 MED FILL — AZITHROMYCIN 250 MG TABLET: 250 | 5 days supply | Qty: 6 | Fill #0

## 2016-05-01 MED FILL — predniSONE 10 MG TABS: 10 | 15 days supply | Qty: 45 | Fill #0

## 2016-05-06 DIAGNOSIS — Z1389 Encounter for screening for other disorder: Secondary | ICD-10-CM | POA: Diagnosis not present

## 2016-05-06 DIAGNOSIS — J069 Acute upper respiratory infection, unspecified: Secondary | ICD-10-CM | POA: Diagnosis not present

## 2016-05-06 DIAGNOSIS — Z6830 Body mass index (BMI) 30.0-30.9, adult: Secondary | ICD-10-CM | POA: Diagnosis not present

## 2016-05-06 DIAGNOSIS — K219 Gastro-esophageal reflux disease without esophagitis: Secondary | ICD-10-CM | POA: Diagnosis not present

## 2016-05-06 MED FILL — HYDROCODONE-HOMATROPINE SYR: 5-1.5 | 10 days supply | Qty: 200 | Fill #0

## 2016-05-14 DIAGNOSIS — H524 Presbyopia: Secondary | ICD-10-CM | POA: Diagnosis not present

## 2016-05-14 DIAGNOSIS — H52203 Unspecified astigmatism, bilateral: Secondary | ICD-10-CM | POA: Diagnosis not present

## 2016-05-15 DIAGNOSIS — Z6830 Body mass index (BMI) 30.0-30.9, adult: Secondary | ICD-10-CM | POA: Diagnosis not present

## 2016-05-15 DIAGNOSIS — J310 Chronic rhinitis: Secondary | ICD-10-CM | POA: Diagnosis not present

## 2016-05-15 DIAGNOSIS — E6609 Other obesity due to excess calories: Secondary | ICD-10-CM | POA: Diagnosis not present

## 2016-05-15 MED FILL — ALL DAY ALLERGY 10 MG TAB: 10 | 100 days supply | Qty: 100 | Fill #0

## 2016-05-15 MED FILL — FLUTICASONE PROP 50 MCG SPR: 50 | 30 days supply | Qty: 16 | Fill #0

## 2016-05-15 MED FILL — IPRATROPIUM 0.03% SPRAY: 0.03 | 30 days supply | Qty: 30 | Fill #0

## 2016-06-12 MED FILL — ELIQUIS 5 MG TABLET: 5 | 90 days supply | Qty: 180 | Fill #3

## 2016-06-13 ENCOUNTER — Ambulatory Visit (INDEPENDENT_AMBULATORY_CARE_PROVIDER_SITE_OTHER): Payer: 59 | Admitting: Otolaryngology

## 2016-06-13 DIAGNOSIS — J310 Chronic rhinitis: Secondary | ICD-10-CM | POA: Diagnosis not present

## 2016-06-13 DIAGNOSIS — J342 Deviated nasal septum: Secondary | ICD-10-CM | POA: Diagnosis not present

## 2016-06-13 DIAGNOSIS — J343 Hypertrophy of nasal turbinates: Secondary | ICD-10-CM | POA: Diagnosis not present

## 2016-06-13 MED FILL — predniSONE 10 MG TABS: 10 | 6 days supply | Qty: 21 | Fill #0

## 2016-06-13 MED FILL — IPRATROPIUM 0.06% SPRAY: 0.06 | 30 days supply | Qty: 15 | Fill #0

## 2016-07-18 ENCOUNTER — Ambulatory Visit (INDEPENDENT_AMBULATORY_CARE_PROVIDER_SITE_OTHER): Payer: 59 | Admitting: Otolaryngology

## 2016-07-18 DIAGNOSIS — J342 Deviated nasal septum: Secondary | ICD-10-CM

## 2016-07-18 DIAGNOSIS — J343 Hypertrophy of nasal turbinates: Secondary | ICD-10-CM | POA: Diagnosis not present

## 2016-07-18 MED FILL — FLUTICASONE PROP 50 MCG SPR: 50 | 30 days supply | Qty: 16 | Fill #0

## 2016-07-18 MED FILL — IPRATROPIUM 0.06% SPRAY: 0.06 | 30 days supply | Qty: 15 | Fill #0

## 2016-07-29 DIAGNOSIS — M9903 Segmental and somatic dysfunction of lumbar region: Secondary | ICD-10-CM | POA: Diagnosis not present

## 2016-07-29 DIAGNOSIS — M5417 Radiculopathy, lumbosacral region: Secondary | ICD-10-CM | POA: Diagnosis not present

## 2016-07-30 MED FILL — METOPROLOL TARTRATE 50 MG T: 50 | 90 days supply | Qty: 180 | Fill #3

## 2016-07-30 MED FILL — CARTIA XT 180 MG CAPSULE SA: 180 | 90 days supply | Qty: 90 | Fill #3

## 2016-07-30 MED FILL — SIMVASTATIN 20 MG TABLET: 20 | 90 days supply | Qty: 90 | Fill #3

## 2016-08-15 DIAGNOSIS — Z1389 Encounter for screening for other disorder: Secondary | ICD-10-CM | POA: Diagnosis not present

## 2016-08-15 DIAGNOSIS — Z681 Body mass index (BMI) 19 or less, adult: Secondary | ICD-10-CM | POA: Diagnosis not present

## 2016-08-15 DIAGNOSIS — J069 Acute upper respiratory infection, unspecified: Secondary | ICD-10-CM | POA: Diagnosis not present

## 2016-08-15 DIAGNOSIS — J302 Other seasonal allergic rhinitis: Secondary | ICD-10-CM | POA: Diagnosis not present

## 2016-08-15 MED FILL — AZITHROMYCIN 250 MG TABLET: 250 | 5 days supply | Qty: 6 | Fill #0

## 2016-08-23 DIAGNOSIS — M722 Plantar fascial fibromatosis: Secondary | ICD-10-CM | POA: Diagnosis not present

## 2016-08-23 DIAGNOSIS — L859 Epidermal thickening, unspecified: Secondary | ICD-10-CM | POA: Diagnosis not present

## 2016-08-23 DIAGNOSIS — M79672 Pain in left foot: Secondary | ICD-10-CM | POA: Diagnosis not present

## 2016-08-29 ENCOUNTER — Ambulatory Visit (INDEPENDENT_AMBULATORY_CARE_PROVIDER_SITE_OTHER): Payer: 59 | Admitting: Otolaryngology

## 2016-08-29 DIAGNOSIS — J310 Chronic rhinitis: Secondary | ICD-10-CM

## 2016-08-29 DIAGNOSIS — J343 Hypertrophy of nasal turbinates: Secondary | ICD-10-CM

## 2016-09-02 MED FILL — FLUTICASONE PROP 50 MCG SPR: 50 | 30 days supply | Qty: 16 | Fill #1

## 2016-09-02 MED FILL — IPRATROPIUM 0.06% SPRAY: 0.06 | 30 days supply | Qty: 15 | Fill #1

## 2016-09-10 ENCOUNTER — Other Ambulatory Visit: Payer: Self-pay | Admitting: Internal Medicine

## 2016-09-10 MED ORDER — ELIQUIS 5 MG PO TABS: 5.0000 mg | ORAL_TABLET | Freq: Two times a day (BID) | ORAL | 3 refills | Status: DC

## 2016-09-10 MED FILL — ELIQUIS 5 MG TABLET: 5 | 90 days supply | Qty: 180 | Fill #0

## 2016-09-19 DIAGNOSIS — Z0001 Encounter for general adult medical examination with abnormal findings: Secondary | ICD-10-CM | POA: Diagnosis not present

## 2016-09-19 DIAGNOSIS — Z1389 Encounter for screening for other disorder: Secondary | ICD-10-CM | POA: Diagnosis not present

## 2016-09-19 DIAGNOSIS — I10 Essential (primary) hypertension: Secondary | ICD-10-CM | POA: Diagnosis not present

## 2016-09-19 DIAGNOSIS — Z6831 Body mass index (BMI) 31.0-31.9, adult: Secondary | ICD-10-CM | POA: Diagnosis not present

## 2016-09-19 DIAGNOSIS — E6609 Other obesity due to excess calories: Secondary | ICD-10-CM | POA: Diagnosis not present

## 2016-09-19 DIAGNOSIS — D689 Coagulation defect, unspecified: Secondary | ICD-10-CM | POA: Diagnosis not present

## 2016-09-19 MED FILL — HYDROCODONE-CHLORPHENIRAM S: 10-8 | 10 days supply | Qty: 120 | Fill #0

## 2016-09-19 MED FILL — levoFLOXacin 500 MG TABS: 500 | 10 days supply | Qty: 10 | Fill #0

## 2016-09-23 DIAGNOSIS — E748 Other specified disorders of carbohydrate metabolism: Secondary | ICD-10-CM | POA: Diagnosis not present

## 2016-10-07 ENCOUNTER — Other Ambulatory Visit: Payer: Self-pay | Admitting: Internal Medicine

## 2016-10-07 MED FILL — IPRATROPIUM 0.06% SPRAY: 0.06 | 30 days supply | Qty: 15 | Fill #1

## 2016-10-07 MED FILL — CARTIA XT 180 MG CAPSULE SA: 180 | 30 days supply | Qty: 30 | Fill #0

## 2016-10-07 MED FILL — FLUTICASONE PROP 50 MCG SPR: 50 | 30 days supply | Qty: 16 | Fill #1

## 2016-10-07 NOTE — Telephone Encounter (Signed)
Rx has been sent to the pharmacy electronically. ° °

## 2016-10-16 DIAGNOSIS — M9903 Segmental and somatic dysfunction of lumbar region: Secondary | ICD-10-CM | POA: Diagnosis not present

## 2016-10-16 DIAGNOSIS — M5417 Radiculopathy, lumbosacral region: Secondary | ICD-10-CM | POA: Diagnosis not present

## 2016-10-31 DIAGNOSIS — Z1389 Encounter for screening for other disorder: Secondary | ICD-10-CM | POA: Diagnosis not present

## 2016-10-31 DIAGNOSIS — A493 Mycoplasma infection, unspecified site: Secondary | ICD-10-CM | POA: Diagnosis not present

## 2016-10-31 DIAGNOSIS — Z6831 Body mass index (BMI) 31.0-31.9, adult: Secondary | ICD-10-CM | POA: Diagnosis not present

## 2016-10-31 DIAGNOSIS — B349 Viral infection, unspecified: Secondary | ICD-10-CM | POA: Diagnosis not present

## 2016-10-31 DIAGNOSIS — E6609 Other obesity due to excess calories: Secondary | ICD-10-CM | POA: Diagnosis not present

## 2016-10-31 DIAGNOSIS — J069 Acute upper respiratory infection, unspecified: Secondary | ICD-10-CM | POA: Diagnosis not present

## 2016-10-31 MED FILL — HYDROCODONE-CHLORPHENIRAM S: 10-8 | 12 days supply | Qty: 120 | Fill #0

## 2016-10-31 MED FILL — AZITHROMYCIN 250 MG TABLET: 250 | 5 days supply | Qty: 6 | Fill #0

## 2016-11-05 ENCOUNTER — Encounter: Payer: Self-pay | Admitting: Internal Medicine

## 2016-11-05 ENCOUNTER — Ambulatory Visit (INDEPENDENT_AMBULATORY_CARE_PROVIDER_SITE_OTHER): Payer: 59 | Admitting: Internal Medicine

## 2016-11-05 VITALS — BP 126/64 | HR 60 | Ht 70.0 in | Wt 209.0 lb

## 2016-11-05 DIAGNOSIS — I10 Essential (primary) hypertension: Secondary | ICD-10-CM

## 2016-11-05 DIAGNOSIS — I480 Paroxysmal atrial fibrillation: Secondary | ICD-10-CM | POA: Diagnosis not present

## 2016-11-05 DIAGNOSIS — E785 Hyperlipidemia, unspecified: Secondary | ICD-10-CM

## 2016-11-05 MED ORDER — METOPROLOL TARTRATE 50 MG PO TABS: 50.0000 mg | ORAL_TABLET | Freq: Two times a day (BID) | ORAL | 3 refills | Status: DC

## 2016-11-05 MED ORDER — ELIQUIS 5 MG PO TABS: 5.0000 mg | ORAL_TABLET | Freq: Two times a day (BID) | ORAL | 3 refills | Status: DC

## 2016-11-05 MED ORDER — DILTIAZEM HCL ER COATED BEADS 180 MG PO CP24: 180.0000 mg | ORAL_CAPSULE | Freq: Every day | ORAL | 3 refills | Status: DC

## 2016-11-05 MED FILL — DILTIAZEM 24HR ER 180 MG CA: 180 | 90 days supply | Qty: 90 | Fill #0

## 2016-11-05 MED FILL — METOPROLOL TARTRATE 50 MG T: 50 | 90 days supply | Qty: 180 | Fill #0

## 2016-11-05 NOTE — Progress Notes (Signed)
OFFICE NOTE  Chief Complaint:   routine follow-up, no complaints  Primary Care Physician: Purvis Kilts, MD  HPI:  Joe Nigh Sr. is a 82 year old gentleman with history of paroxysmal A-fib, mild nonobstructive coronary disease, hypertension and dyslipidemia. He also has CPAP and has done well with that. He remains active although has had a few pounds of weight gain, really has no symptoms. We talked about stroke prevention with regard to paroxysmal atrial fibrillation which he has had in the past and he was on Coumadin. He has since switched to Eliquis 5 mg by mouth twice a day and is tolerating this very well.  He denies any bleeding complications. He has not had any recurrent atrial fibrillation and he is aware of.  Overall he denies any chest pain or worsening shortness of breath. He does report some lower leg swelling, which is improved with wearing stockings.  He has no new complaints today. We obtained recent laboratory work shows an excellent cholesterol profile. Total cholesterol is 124, triglycerides 88, HDL 57 LDL 49.   Joe Lee  returns today. He is without any significant complaints.  Unfortunately, he recently had some lower GI bleeding. He underwent colonoscopy and there was a small polyp but no clear source for this. He since been started on iron, fiber, and a probiotic. He is in need of refills of his medications today. He continues on Eliquis and reports that the bleeding has subsided.  11/05/2016  Joe Lee returns today for follow-up. He has had no new complaints since I last saw him. He is due for repeat lab work. He said he recently saw his primary care provider who drew a lipid profile and check his blood sugar which apparently has been running high. He may need medication adjustment. I will obtain those results. He is also due for refills of his medications. He denies any recurrent atrial fibrillation. He's had no bleeding complications on  Eliquis. He denies any chest pain or worsening shortness of breath.  PMHx:  Past Medical History:  Diagnosis Date  . AF (atrial fibrillation) ([place]) 06/06/2010   2D Echo EF>55% normal Echo  . Chronic constipation   . Colon polyp 1/07   adenomatous  . Diverticulitis   . GERD (gastroesophageal reflux disease)   . HTN (hypertension)   . Hyperlipidemia   . OSA on CPAP 11/09/2007   sleep study REM 14mins AHI was 3.64hr at 7cm  . SOB (shortness of breath) 05/30/2010   stress test normal stress test EF63%    Past Surgical History:  Procedure Laterality Date  . AV FISTULA REPAIR    . CARDIAC CATHETERIZATION  02/06/2007  . CATARACT EXTRACTION W/PHACO  07/28/2012   Procedure: CATARACT EXTRACTION PHACO AND INTRAOCULAR LENS PLACEMENT (IOC);  Surgeon: [name], MD;  Location: [facility];  Service: Ophthalmology;  Laterality: Right;  CDE=10.80  . CHOLECYSTECTOMY     Dr [name] -[facility]  . COLONOSCOPY N/A 07/23/2015   Procedure: COLONOSCOPY;  Surgeon: [name], MD;  Location[facility] ENDOSCOPY;  Service: Endoscopy;  Laterality: N/A;  . HEMORRHOID SURGERY    . HERNIA REPAIR    . KNEE SURGERY     right-arthroscopy  . ROTATOR CUFF REPAIR     right    FAMHx:  No family history on file.  SOCHx:   reports that he quit smoking about 39 years ago. His smoking use included Cigarettes. He has a 1.50 pack-year smoking history. He has never used smokeless tobacco. He  reports that he drinks about 4.8 oz of alcohol per week . He reports that he does not use drugs.  ALLERGIES:  Allergies  Allergen Reactions  . Penicillins Hives and Itching    ROS: Pertinent items noted in HPI and remainder of comprehensive ROS otherwise negative.  HOME MEDS: Current Outpatient Prescriptions  Medication Sig Dispense Refill  . Alpha-D-Galactosidase (BEANO PO) Take 1 tablet by mouth daily as needed. For gas    . aspirin EC 81 MG tablet Take 1 tablet (81 mg total) by mouth daily.    Marland Kitchen diltiazem (CARTIA XT) 180 MG  24 hr capsule Take 1 capsule (180 mg total) by mouth daily. 90 capsule 3  . docusate sodium (COLACE) 100 MG capsule Take 100 mg by mouth daily as needed for mild constipation.    Marland Kitchen ELIQUIS 5 MG TABS tablet Take 1 tablet (5 mg total) by mouth 2 (two) times daily. 180 tablet 3  . FIBER PO Take 500 mg by mouth daily.    . fish oil-omega-3 fatty acids 1000 MG capsule Take 1 g by mouth daily.     Marland Kitchen GARLIC PO Take 1 capsule by mouth daily.     . IRON PO Take 27 mg by mouth daily.    . metoprolol (LOPRESSOR) 50 MG tablet Take 1 tablet (50 mg total) by mouth 2 (two) times daily. 180 tablet 3  . Multiple Vitamin (MULTIVITAMIN WITH MINERALS) TABS Take 1 tablet by mouth daily.    . Probiotic Product (PROBIOTIC DAILY) CAPS Take 1 capsule by mouth daily. 90 capsule 3  . simvastatin (ZOCOR) 20 MG tablet Take 1 tablet (20 mg total) by mouth at bedtime. 90 tablet 3   No current facility-administered medications for this visit.     LABS/IMAGING: No results found for this or any previous visit (from the past 48 hour(s)). No results found.  VITALS: BP 126/64   Pulse 60   Ht 5\' 10"  (1.778 m)   Wt 209 lb (94.8 kg)   BMI 29.99 kg/m   EXAM: General appearance: alert and no distress Neck: no adenopathy, no carotid bruit, no JVD, supple, symmetrical, trachea midline and thyroid not enlarged, symmetric, no tenderness/mass/nodules Lungs: clear to auscultation bilaterally Heart: regular rate and rhythm, S1, S2 normal, no murmur, click, rub or gallop Abdomen: soft, non-tender; bowel sounds normal; no masses,  no organomegaly Extremities: extremities normal, atraumatic, no cyanosis or edema and There is a lipoma over the left scapula Pulses: 2+ and symmetric Skin: Skin color, texture, turgor normal. No rashes or lesions Neurologic: Grossly normal  EKG:  deferred  ASSESSMENT: 1. Paroxysmal atrial fibrillation-now in sinus on Eliquis 2. History of GIB 3. Hypertension 4. Dyslipidemia - at  goal 5. Obstructive sleep apnea on CPAP 6. Chronic venous insufficiency with edema  PLAN: 1.   Joe Lee is doing well. Blood pressure is well controlled. He denies any recurrent atrial fibrillation and is anticoagulated on Eliquis. He also takes low-dose aspirin for history of nonobstructive coronary disease. Recently apparently his cholesterol has been elevated and blood sugar is well. He is due to follow-up with his primary care provider regarding this. We will obtain lab work from his PCP. If cholesterol is elevated I would likely recommend switching him from simvastatin to atorvastatin as there is a contraindication for using simvastatin with his diltiazem. Follow-up with me annually or sooner as necessary.  [name], MD, [facility] Attending Cardiologist [place] Mouhamed Glassco 11/05/2016, 1:42 PM

## 2016-11-05 NOTE — Patient Instructions (Signed)
Medication Instructions:  Your physician recommends that you continue on your current medications as directed. Please refer to the Current Medication list given to you today.  Labwork: WILL REQUEST YOUR RECENT LABS FROM PRIMARY CARE   Testing/Procedures: NONE  Follow-Up: Your physician wants you to follow-up in: [place] will receive a reminder letter in the mail two months in advance. If you don't receive a letter, please call our office to schedule the follow-up appointment.  If you need a refill on your cardiac medications before your next appointment, please call your pharmacy.

## 2016-11-08 ENCOUNTER — Other Ambulatory Visit: Payer: Self-pay | Admitting: Internal Medicine

## 2016-11-11 MED FILL — SIMVASTATIN 20 MG TABLET: 20 | 90 days supply | Qty: 90 | Fill #0

## 2016-11-11 NOTE — Telephone Encounter (Signed)
Rx has been sent to the pharmacy electronically. ° °

## 2016-11-18 ENCOUNTER — Ambulatory Visit: Payer: 59 | Admitting: Internal Medicine

## 2016-11-26 DIAGNOSIS — Z1389 Encounter for screening for other disorder: Secondary | ICD-10-CM | POA: Diagnosis not present

## 2016-11-26 DIAGNOSIS — I4891 Unspecified atrial fibrillation: Secondary | ICD-10-CM | POA: Diagnosis not present

## 2016-11-26 DIAGNOSIS — I10 Essential (primary) hypertension: Secondary | ICD-10-CM | POA: Diagnosis not present

## 2016-11-26 DIAGNOSIS — Z6830 Body mass index (BMI) 30.0-30.9, adult: Secondary | ICD-10-CM | POA: Diagnosis not present

## 2016-11-26 MED FILL — FLUTICASONE PROP 50 MCG SPR: 50 | 30 days supply | Qty: 16 | Fill #0

## 2016-11-26 MED FILL — HYDROCODONE-CHLORPHENIRAM S: 10-8 | 12 days supply | Qty: 120 | Fill #0

## 2016-12-17 MED FILL — ELIQUIS 5 MG TABLET: 5 | 90 days supply | Qty: 180 | Fill #1

## 2016-12-18 DIAGNOSIS — M9903 Segmental and somatic dysfunction of lumbar region: Secondary | ICD-10-CM | POA: Diagnosis not present

## 2016-12-18 DIAGNOSIS — M9904 Segmental and somatic dysfunction of sacral region: Secondary | ICD-10-CM | POA: Diagnosis not present

## 2016-12-18 DIAGNOSIS — M5417 Radiculopathy, lumbosacral region: Secondary | ICD-10-CM | POA: Diagnosis not present

## 2017-01-24 DIAGNOSIS — M5417 Radiculopathy, lumbosacral region: Secondary | ICD-10-CM | POA: Diagnosis not present

## 2017-01-24 DIAGNOSIS — M9904 Segmental and somatic dysfunction of sacral region: Secondary | ICD-10-CM | POA: Diagnosis not present

## 2017-01-24 DIAGNOSIS — M9903 Segmental and somatic dysfunction of lumbar region: Secondary | ICD-10-CM | POA: Diagnosis not present

## 2017-02-06 MED FILL — METOPROLOL TARTRATE 50 MG T: 50 | 90 days supply | Qty: 180 | Fill #1

## 2017-02-06 MED FILL — CARTIA XT 180 MG CAPSULE SA: 180 | 90 days supply | Qty: 90 | Fill #1

## 2017-02-06 MED FILL — SIMVASTATIN 20 MG TABLET: 20 | 90 days supply | Qty: 90 | Fill #1

## 2017-02-07 DIAGNOSIS — M5417 Radiculopathy, lumbosacral region: Secondary | ICD-10-CM | POA: Diagnosis not present

## 2017-02-07 DIAGNOSIS — M9904 Segmental and somatic dysfunction of sacral region: Secondary | ICD-10-CM | POA: Diagnosis not present

## 2017-02-07 DIAGNOSIS — M9903 Segmental and somatic dysfunction of lumbar region: Secondary | ICD-10-CM | POA: Diagnosis not present

## 2017-02-27 ENCOUNTER — Ambulatory Visit (INDEPENDENT_AMBULATORY_CARE_PROVIDER_SITE_OTHER): Payer: 59 | Admitting: Otolaryngology

## 2017-02-27 DIAGNOSIS — J342 Deviated nasal septum: Secondary | ICD-10-CM | POA: Diagnosis not present

## 2017-02-27 DIAGNOSIS — J310 Chronic rhinitis: Secondary | ICD-10-CM

## 2017-02-27 MED FILL — IPRATROPIUM 0.06% SPRAY: 0.06 | 30 days supply | Qty: 15 | Fill #0

## 2017-02-27 MED FILL — FLUTICASONE PROP 50 MCG SPR: 50 | 30 days supply | Qty: 16 | Fill #0

## 2017-03-19 MED FILL — ELIQUIS 5 MG TABLET: 5 | 90 days supply | Qty: 180 | Fill #2

## 2017-04-14 ENCOUNTER — Ambulatory Visit (HOSPITAL_COMMUNITY)
Admission: RE | Admit: 2017-04-14 | Discharge: 2017-04-14 | Disposition: A | Discharge status: Home / Self Care | Payer: 59 | Source: Ambulatory Visit | Attending: Podiatry | Admitting: Podiatry

## 2017-04-14 ENCOUNTER — Other Ambulatory Visit (HOSPITAL_COMMUNITY): Payer: Self-pay | Admitting: Podiatry

## 2017-04-14 DIAGNOSIS — M65871 Other synovitis and tenosynovitis, right ankle and foot: Secondary | ICD-10-CM | POA: Insufficient documentation

## 2017-04-14 DIAGNOSIS — S8261XA Displaced fracture of lateral malleolus of right fibula, initial encounter for closed fracture: Secondary | ICD-10-CM | POA: Diagnosis not present

## 2017-04-14 DIAGNOSIS — M25571 Pain in right ankle and joints of right foot: Secondary | ICD-10-CM | POA: Insufficient documentation

## 2017-04-14 DIAGNOSIS — S92352A Displaced fracture of fifth metatarsal bone, left foot, initial encounter for closed fracture: Secondary | ICD-10-CM | POA: Diagnosis not present

## 2017-04-14 DIAGNOSIS — M79673 Pain in unspecified foot: Secondary | ICD-10-CM | POA: Diagnosis not present

## 2017-04-14 DIAGNOSIS — M25579 Pain in unspecified ankle and joints of unspecified foot: Secondary | ICD-10-CM | POA: Diagnosis not present

## 2017-04-14 DIAGNOSIS — M7731 Calcaneal spur, right foot: Secondary | ICD-10-CM | POA: Insufficient documentation

## 2017-04-14 DIAGNOSIS — M7989 Other specified soft tissue disorders: Secondary | ICD-10-CM | POA: Diagnosis not present

## 2017-04-22 DIAGNOSIS — E748 Other specified disorders of carbohydrate metabolism: Secondary | ICD-10-CM | POA: Diagnosis not present

## 2017-04-22 DIAGNOSIS — E782 Mixed hyperlipidemia: Secondary | ICD-10-CM | POA: Diagnosis not present

## 2017-05-05 DIAGNOSIS — M79672 Pain in left foot: Secondary | ICD-10-CM | POA: Diagnosis not present

## 2017-05-05 DIAGNOSIS — S8261XA Displaced fracture of lateral malleolus of right fibula, initial encounter for closed fracture: Secondary | ICD-10-CM | POA: Diagnosis not present

## 2017-05-16 MED FILL — SIMVASTATIN 20 MG TABLET: 20 | 90 days supply | Qty: 90 | Fill #2

## 2017-05-16 MED FILL — METOPROLOL TARTRATE 50 MG T: 50 | 90 days supply | Qty: 180 | Fill #2

## 2017-05-16 MED FILL — CARTIA XT 180 MG CAPSULE SA: 180 | 90 days supply | Qty: 90 | Fill #2

## 2017-05-20 DIAGNOSIS — H5213 Myopia, bilateral: Secondary | ICD-10-CM | POA: Diagnosis not present

## 2017-05-20 DIAGNOSIS — H52203 Unspecified astigmatism, bilateral: Secondary | ICD-10-CM | POA: Diagnosis not present

## 2017-05-20 DIAGNOSIS — H524 Presbyopia: Secondary | ICD-10-CM | POA: Diagnosis not present

## 2017-05-26 DIAGNOSIS — S8261XA Displaced fracture of lateral malleolus of right fibula, initial encounter for closed fracture: Secondary | ICD-10-CM | POA: Diagnosis not present

## 2017-05-26 DIAGNOSIS — M25579 Pain in unspecified ankle and joints of unspecified foot: Secondary | ICD-10-CM | POA: Diagnosis not present

## 2017-06-16 DIAGNOSIS — S8261XA Displaced fracture of lateral malleolus of right fibula, initial encounter for closed fracture: Secondary | ICD-10-CM | POA: Diagnosis not present

## 2017-06-16 DIAGNOSIS — M25579 Pain in unspecified ankle and joints of unspecified foot: Secondary | ICD-10-CM | POA: Diagnosis not present

## 2017-06-16 DIAGNOSIS — M79672 Pain in left foot: Secondary | ICD-10-CM | POA: Diagnosis not present

## 2017-06-23 MED FILL — ELIQUIS 5 MG TABLET: 5 | 90 days supply | Qty: 180 | Fill #3

## 2017-07-08 ENCOUNTER — Encounter: Payer: Self-pay | Admitting: Orthopedic Surgery

## 2017-07-08 ENCOUNTER — Ambulatory Visit (INDEPENDENT_AMBULATORY_CARE_PROVIDER_SITE_OTHER): Payer: 59 | Admitting: Orthopedic Surgery

## 2017-07-08 VITALS — BP 142/93 | HR 59 | Ht 70.0 in | Wt 206.0 lb

## 2017-07-08 DIAGNOSIS — M25571 Pain in right ankle and joints of right foot: Secondary | ICD-10-CM | POA: Diagnosis not present

## 2017-07-08 NOTE — Progress Notes (Signed)
NEW PATIENT OFFICE VISIT    Chief Complaint  Patient presents with  . Ankle Pain    RIGHT ANKLE PAIN    83 YO MALE works at [employer] injured right foot frx right small toe , Podiatry treated but on subsequent xrays saw avulsion of tip of lateral mall so he sent him to [place] for eval. Injury was 6 weeks ago and he had mild dull lateral right ankle  pain swelling and     Review of Systems  Constitutional: Negative for chills and fever.  Cardiovascular:       Edema right foot at end of day      Past Medical History:  Diagnosis Date  . AF (atrial fibrillation) ([place]) 06/06/2010   2D Echo EF>55% normal Echo  . Chronic constipation   . Colon polyp 1/07   adenomatous  . Diverticulitis   . GERD (gastroesophageal reflux disease)   . HTN (hypertension)   . Hyperlipidemia   . OSA on CPAP 11/09/2007   sleep study REM 72mins AHI was 3.64hr at 7cm  . SOB (shortness of breath) 05/30/2010   stress test normal stress test EF63%    Past Surgical History:  Procedure Laterality Date  . AV FISTULA REPAIR    . CARDIAC CATHETERIZATION  02/06/2007  . CATARACT EXTRACTION W/PHACO  07/28/2012   Procedure: CATARACT EXTRACTION PHACO AND INTRAOCULAR LENS PLACEMENT (IOC);  Surgeon: [name], MD;  Location: [facility];  Service: Ophthalmology;  Laterality: Right;  CDE=10.80  . CHOLECYSTECTOMY     Dr [name] -[facility]  . COLONOSCOPY N/A 07/23/2015   Procedure: COLONOSCOPY;  Surgeon: [name], MD;  Location: [facility] ENDOSCOPY;  Service: Endoscopy;  Laterality: N/A;  . HEMORRHOID SURGERY    . HERNIA REPAIR    . KNEE SURGERY     right-arthroscopy  . ROTATOR CUFF REPAIR     right    No family history on file. Social History  Substance Use Topics  . Smoking status: Former Smoker    Packs/day: 0.25    Years: 6.00    Types: Cigarettes    Quit date: 07/22/1977  . Smokeless tobacco: Never Used  . Alcohol use 4.8 oz/week    7 Glasses of wine, 1 Cans of beer per week     Comment: 1 every 3-4 months,  red wine    BP (!) 142/93   Pulse (!) 59   Ht 5\' 10"  (1.778 m)   Wt 206 lb (93.4 kg)   BMI 29.56 kg/m   Physical Exam  Constitutional: He is oriented to person, place, and time. He appears well-developed and well-nourished.  Vital signs have been reviewed and are stable. Gen. appearance the patient is well-developed and well-nourished with normal grooming and hygiene.   Musculoskeletal:  GAIT IS normal   Neurological: He is alert and oriented to person, place, and time.  Skin: Skin is warm and dry. No erythema.  Psychiatric: He has a normal mood and affect.  Vitals reviewed.   Left Ankle Exam   Tenderness  None  Range of Motion  Normal left ankle ROM  Muscle Strength  Normal left ankle strength  Tests  Anterior drawer: Negative.  Comments:  Neurovascualr exam intact   Right Ankle Exam   Tenderness  The patient is experiencing tenderness in the lateral malleolus, mild.  Range of Motion  Normal right ankle ROM  Muscle Strength  Normal right ankle strength  Tests  Anterior drawer: NegativeComments Neuro vascular exam intact  Encounter Diagnosis  Name Primary?  . Acute right ankle pain Yes     PLAN:   Symptomatic treatment

## 2017-07-17 DIAGNOSIS — M9902 Segmental and somatic dysfunction of thoracic region: Secondary | ICD-10-CM | POA: Diagnosis not present

## 2017-07-17 DIAGNOSIS — M545 Low back pain: Secondary | ICD-10-CM | POA: Diagnosis not present

## 2017-07-17 DIAGNOSIS — M9901 Segmental and somatic dysfunction of cervical region: Secondary | ICD-10-CM | POA: Diagnosis not present

## 2017-07-17 DIAGNOSIS — M9903 Segmental and somatic dysfunction of lumbar region: Secondary | ICD-10-CM | POA: Diagnosis not present

## 2017-07-18 DIAGNOSIS — M9902 Segmental and somatic dysfunction of thoracic region: Secondary | ICD-10-CM | POA: Diagnosis not present

## 2017-07-18 DIAGNOSIS — M9903 Segmental and somatic dysfunction of lumbar region: Secondary | ICD-10-CM | POA: Diagnosis not present

## 2017-07-18 DIAGNOSIS — M9901 Segmental and somatic dysfunction of cervical region: Secondary | ICD-10-CM | POA: Diagnosis not present

## 2017-07-28 DIAGNOSIS — M9903 Segmental and somatic dysfunction of lumbar region: Secondary | ICD-10-CM | POA: Diagnosis not present

## 2017-07-28 DIAGNOSIS — M9901 Segmental and somatic dysfunction of cervical region: Secondary | ICD-10-CM | POA: Diagnosis not present

## 2017-07-28 DIAGNOSIS — M9902 Segmental and somatic dysfunction of thoracic region: Secondary | ICD-10-CM | POA: Diagnosis not present

## 2017-07-28 DIAGNOSIS — M545 Low back pain: Secondary | ICD-10-CM | POA: Diagnosis not present

## 2017-07-29 DIAGNOSIS — J069 Acute upper respiratory infection, unspecified: Secondary | ICD-10-CM | POA: Diagnosis not present

## 2017-07-29 DIAGNOSIS — E6609 Other obesity due to excess calories: Secondary | ICD-10-CM | POA: Diagnosis not present

## 2017-07-29 DIAGNOSIS — J209 Acute bronchitis, unspecified: Secondary | ICD-10-CM | POA: Diagnosis not present

## 2017-07-29 DIAGNOSIS — Z6830 Body mass index (BMI) 30.0-30.9, adult: Secondary | ICD-10-CM | POA: Diagnosis not present

## 2017-07-29 DIAGNOSIS — R350 Frequency of micturition: Secondary | ICD-10-CM | POA: Diagnosis not present

## 2017-07-29 DIAGNOSIS — Z1389 Encounter for screening for other disorder: Secondary | ICD-10-CM | POA: Diagnosis not present

## 2017-07-29 MED FILL — HYDROCODONE-CHLORPHEN ER SU: 10-8 | 12 days supply | Qty: 120 | Fill #0

## 2017-07-29 MED FILL — levoFLOXacin 500 MG TABS: 500 | 10 days supply | Qty: 10 | Fill #0

## 2017-08-06 DIAGNOSIS — M9901 Segmental and somatic dysfunction of cervical region: Secondary | ICD-10-CM | POA: Diagnosis not present

## 2017-08-06 DIAGNOSIS — M9902 Segmental and somatic dysfunction of thoracic region: Secondary | ICD-10-CM | POA: Diagnosis not present

## 2017-08-06 DIAGNOSIS — M9903 Segmental and somatic dysfunction of lumbar region: Secondary | ICD-10-CM | POA: Diagnosis not present

## 2017-08-15 DIAGNOSIS — M9903 Segmental and somatic dysfunction of lumbar region: Secondary | ICD-10-CM | POA: Diagnosis not present

## 2017-08-15 DIAGNOSIS — M9901 Segmental and somatic dysfunction of cervical region: Secondary | ICD-10-CM | POA: Diagnosis not present

## 2017-08-15 DIAGNOSIS — M9902 Segmental and somatic dysfunction of thoracic region: Secondary | ICD-10-CM | POA: Diagnosis not present

## 2017-08-15 DIAGNOSIS — M545 Low back pain: Secondary | ICD-10-CM | POA: Diagnosis not present

## 2017-08-19 MED FILL — METOPROLOL TARTRATE 50 MG T: 50 | 90 days supply | Qty: 180 | Fill #3

## 2017-08-19 MED FILL — CARTIA XT 180 MG CAPSULE SA: 180 | 90 days supply | Qty: 90 | Fill #3

## 2017-09-03 DIAGNOSIS — M9901 Segmental and somatic dysfunction of cervical region: Secondary | ICD-10-CM | POA: Diagnosis not present

## 2017-09-03 DIAGNOSIS — M9902 Segmental and somatic dysfunction of thoracic region: Secondary | ICD-10-CM | POA: Diagnosis not present

## 2017-09-03 DIAGNOSIS — M545 Low back pain: Secondary | ICD-10-CM | POA: Diagnosis not present

## 2017-09-03 DIAGNOSIS — M9903 Segmental and somatic dysfunction of lumbar region: Secondary | ICD-10-CM | POA: Diagnosis not present

## 2017-09-04 MED FILL — SIMVASTATIN 20 MG TABLET: 20 | 90 days supply | Qty: 90 | Fill #3

## 2017-09-11 DIAGNOSIS — M9901 Segmental and somatic dysfunction of cervical region: Secondary | ICD-10-CM | POA: Diagnosis not present

## 2017-09-11 DIAGNOSIS — M9903 Segmental and somatic dysfunction of lumbar region: Secondary | ICD-10-CM | POA: Diagnosis not present

## 2017-09-11 DIAGNOSIS — M9902 Segmental and somatic dysfunction of thoracic region: Secondary | ICD-10-CM | POA: Diagnosis not present

## 2017-09-11 DIAGNOSIS — M545 Low back pain: Secondary | ICD-10-CM | POA: Diagnosis not present

## 2017-09-29 DIAGNOSIS — Z23 Encounter for immunization: Secondary | ICD-10-CM | POA: Diagnosis not present

## 2017-09-29 MED FILL — ELIQUIS 5 MG TABLET: 5 | 90 days supply | Qty: 180 | Fill #0

## 2017-10-09 DIAGNOSIS — Z6831 Body mass index (BMI) 31.0-31.9, adult: Secondary | ICD-10-CM | POA: Diagnosis not present

## 2017-10-09 DIAGNOSIS — J069 Acute upper respiratory infection, unspecified: Secondary | ICD-10-CM | POA: Diagnosis not present

## 2017-10-09 DIAGNOSIS — E6609 Other obesity due to excess calories: Secondary | ICD-10-CM | POA: Diagnosis not present

## 2017-10-09 DIAGNOSIS — J209 Acute bronchitis, unspecified: Secondary | ICD-10-CM | POA: Diagnosis not present

## 2017-10-09 MED FILL — HYDROCODONE-CHLORPHEN ER SU: 10-8 | 12 days supply | Qty: 120 | Fill #0

## 2017-10-09 MED FILL — MOXIFLOXACIN HCL 400 MG TAB: 400 | 10 days supply | Qty: 10 | Fill #0

## 2017-10-09 MED FILL — PROAIR HFA 90 MCG INHALER: 108 (90 BAS | 30 days supply | Qty: 17 | Fill #0

## 2017-11-10 ENCOUNTER — Ambulatory Visit (INDEPENDENT_AMBULATORY_CARE_PROVIDER_SITE_OTHER): Payer: 59 | Admitting: Internal Medicine

## 2017-11-10 ENCOUNTER — Encounter: Payer: Self-pay | Admitting: Internal Medicine

## 2017-11-10 VITALS — BP 162/94 | HR 77 | Ht 70.0 in | Wt 208.2 lb

## 2017-11-10 DIAGNOSIS — Z9989 Dependence on other enabling machines and devices: Secondary | ICD-10-CM

## 2017-11-10 DIAGNOSIS — I10 Essential (primary) hypertension: Secondary | ICD-10-CM

## 2017-11-10 DIAGNOSIS — G4733 Obstructive sleep apnea (adult) (pediatric): Secondary | ICD-10-CM | POA: Diagnosis not present

## 2017-11-10 DIAGNOSIS — E785 Hyperlipidemia, unspecified: Secondary | ICD-10-CM

## 2017-11-10 DIAGNOSIS — I4892 Unspecified atrial flutter: Secondary | ICD-10-CM | POA: Diagnosis not present

## 2017-11-10 MED ORDER — ELIQUIS 5 MG PO TABS: 5.0000 mg | ORAL_TABLET | Freq: Two times a day (BID) | ORAL | 3 refills | Status: DC

## 2017-11-10 MED ORDER — SIMVASTATIN 20 MG PO TABS: 20.0000 mg | ORAL_TABLET | Freq: Every day | ORAL | 3 refills | Status: DC

## 2017-11-10 MED ORDER — METOPROLOL TARTRATE 50 MG PO TABS: 50.0000 mg | ORAL_TABLET | Freq: Two times a day (BID) | ORAL | 3 refills | Status: DC

## 2017-11-10 MED ORDER — DILTIAZEM HCL ER COATED BEADS 180 MG PO CP24: 180.0000 mg | ORAL_CAPSULE | Freq: Every day | ORAL | 3 refills | Status: DC

## 2017-11-10 MED FILL — CARTIA XT 180 MG CAPSULE SA: 180 | 90 days supply | Qty: 90 | Fill #0

## 2017-11-10 MED FILL — METOPROLOL TARTRATE 50 MG T: 50 | 90 days supply | Qty: 180 | Fill #0

## 2017-11-10 NOTE — Patient Instructions (Addendum)
Your physician recommends that you schedule a follow-up appointment in 3-4 weeks with Dr. [name] (with EKG)

## 2017-11-10 NOTE — Progress Notes (Signed)
OFFICE NOTE  Chief Complaint:  Back pain  Primary Care Physician: [name], MD  HPI:  Joe Nigh Sr. is a 83 year old gentleman with history of paroxysmal A-fib, mild nonobstructive coronary disease, hypertension and dyslipidemia. He also has CPAP and has done well with that. He remains active although has had a few pounds of weight gain, really has no symptoms. We talked about stroke prevention with regard to paroxysmal atrial fibrillation which he has had in the past and he was on Coumadin. He has since switched to Eliquis 5 mg by mouth twice a day and is tolerating this very well.  He denies any bleeding complications. He has not had any recurrent atrial fibrillation and he is aware of.  Overall he denies any chest pain or worsening shortness of breath. He does report some lower leg swelling, which is improved with wearing stockings.  He has no new complaints today. We obtained recent laboratory work shows an excellent cholesterol profile. Total cholesterol is 124, triglycerides 88, HDL 57 LDL 49.   Joe Lee  returns today. He is without any significant complaints.  Unfortunately, he recently had some lower GI bleeding. He underwent colonoscopy and there was a small polyp but no clear source for this. He since been started on iron, fiber, and a probiotic. He is in need of refills of his medications today. He continues on Eliquis and reports that the bleeding has subsided.  11/05/2016  Joe Lee returns today for follow-up. He has had no new complaints since I last saw him. He is due for repeat lab work. He said he recently saw his primary care provider who drew a lipid profile and check his blood sugar which apparently has been running high. He may need medication adjustment. I will obtain those results. He is also due for refills of his medications. He denies any recurrent atrial fibrillation. He's had no bleeding complications on Eliquis. He denies any chest  pain or worsening shortness of breath.  11/10/2017  Joe Lee was seen today for annual follow-up.  Overall he is without complaints except for some back pain.  He has fairly good energy level, but may have some fatigue attributed to aging.  He denies any chest pain or worsening shortness of breath.  His history of atrial fibrillation dates back to 2011 and he has been anticoagulated since then without recurrence.  Today an EKG was noted to be in atrial flutter with variable ventricular response at 77.  Blood pressure is elevated however he has not taken his medicines today.  When inquiring about his Eliquis use, he says he generally takes it twice a day but he has missed a few doses in the last couple of weeks.  We had a long discussion about atrial flutter and the differences between that and atrial fibrillation.  At this point is not clear that he is totally asymptomatic, however may be fatigue related to this.  He understands that his heart would work more efficiently if he were to be in rhythm.  PMHx:  Past Medical History:  Diagnosis Date  . AF (atrial fibrillation) ([place]) 06/06/2010   2D Echo EF>55% normal Echo  . Chronic constipation   . Colon polyp 1/07   adenomatous  . Diverticulitis   . GERD (gastroesophageal reflux disease)   . HTN (hypertension)   . Hyperlipidemia   . OSA on CPAP 11/09/2007   sleep study REM 54mins AHI was 3.64hr at 7cm  . SOB (shortness of breath)  05/30/2010   stress test normal stress test EF63%    Past Surgical History:  Procedure Laterality Date  . AV FISTULA REPAIR    . CARDIAC CATHETERIZATION  02/06/2007  . CATARACT EXTRACTION W/PHACO  07/28/2012   Procedure: CATARACT EXTRACTION PHACO AND INTRAOCULAR LENS PLACEMENT (IOC);  Surgeon: [name], MD;  Location: [facility];  Service: Ophthalmology;  Laterality: Right;  CDE=10.80  . CHOLECYSTECTOMY     Dr [name] -[facility]  . COLONOSCOPY N/A 07/23/2015   Procedure: COLONOSCOPY;  Surgeon: [name], MD;   Location[facility] ENDOSCOPY;  Service: Endoscopy;  Laterality: N/A;  . HEMORRHOID SURGERY    . HERNIA REPAIR    . KNEE SURGERY     right-arthroscopy  . ROTATOR CUFF REPAIR     right    FAMHx:  No family history on file.  SOCHx:   reports that he quit smoking about 40 years ago. His smoking use included cigarettes. He has a 1.50 pack-year smoking history. he has never used smokeless tobacco. He reports that he drinks about 4.8 oz of alcohol per week. He reports that he does not use drugs.  ALLERGIES:  Allergies  Allergen Reactions  . Penicillins Hives and Itching    ROS: Pertinent items noted in HPI and remainder of comprehensive ROS otherwise negative.  HOME MEDS: Current Outpatient Medications  Medication Sig Dispense Refill  . Alpha-D-Galactosidase (BEANO PO) Take 1 tablet by mouth daily as needed. For gas    . aspirin EC 81 MG tablet Take 1 tablet (81 mg total) by mouth daily.    Marland Kitchen diltiazem (CARTIA XT) 180 MG 24 hr capsule Take 1 capsule (180 mg total) by mouth daily. 90 capsule 3  . docusate sodium (COLACE) 100 MG capsule Take 100 mg by mouth daily as needed for mild constipation.    Marland Kitchen ELIQUIS 5 MG TABS tablet Take 1 tablet (5 mg total) by mouth 2 (two) times daily. 180 tablet 3  . FIBER PO Take 500 mg by mouth daily.    . fish oil-omega-3 fatty acids 1000 MG capsule Take 1 g by mouth daily.     Marland Kitchen GARLIC PO Take 1 capsule by mouth daily.     . IRON PO Take 27 mg by mouth daily.    . metoprolol (LOPRESSOR) 50 MG tablet Take 1 tablet (50 mg total) by mouth 2 (two) times daily. 180 tablet 3  . Multiple Vitamin (MULTIVITAMIN WITH MINERALS) TABS Take 1 tablet by mouth daily.    . Probiotic Product (PROBIOTIC DAILY) CAPS Take 1 capsule by mouth daily. 90 capsule 3  . simvastatin (ZOCOR) 20 MG tablet TAKE 1 TABLET BY MOUTH ONCE DAILY AT BEDTIME 90 tablet 3   No current facility-administered medications for this visit.     LABS/IMAGING: No results found for this or any previous  visit (from the past 48 hour(s)). No results found.  VITALS: BP (!) 162/94   Pulse 77   Ht 5\' 10"  (1.778 m)   Wt 208 lb 3.2 oz (94.4 kg)   BMI 29.87 kg/m   EXAM: General appearance: alert, no distress and mildly obese Neck: no carotid bruit, no JVD and thyroid not enlarged, symmetric, no tenderness/mass/nodules Lungs: clear to auscultation bilaterally Heart: irregularly irregular rhythm Abdomen: soft, non-tender; bowel sounds normal; no masses,  no organomegaly Extremities: extremities normal, atraumatic, no cyanosis or edema Pulses: 2+ and symmetric Skin: Skin color, texture, turgor normal. No rashes or lesions Neurologic: Grossly normal Psych: Pleasant  EKG: Atrial flutter  with variable ventricular response at 77, PVCs-personally reviewed  ASSESSMENT: 1. Paroxysmal atrial fibrillation-now in sinus on Eliquis 2. History of GIB on warfarin 3. Hypertension 4. Dyslipidemia - at goal 5. Obstructive sleep apnea on CPAP 6. Chronic venous insufficiency with edema  PLAN: 1.   Joe Lee seems to be doing well although is in atrial flutter today.  This is new for him.  His A. fib dates back to 2011, but has not had recurrence to my knowledge.  He has been on Eliquis without any recurrent GI bleeding.  He may have some fatigue related to this atrial flutter.  We discussed the possibility of a cardioversion and he seems agreeable to it.  Unfortunately he has missed a couple doses in the past 2 weeks.  I advised that he work on compliance with the medication over the next 3-4 weeks and we will see him back in the office.  If he remains in atrial flutter, we will pursue elective cardioversion at that time.  Cholesterol has been well controlled and followed by his PCP.  He reports compliance with CPAP.  Follow-up in 1 month.  [name], MD, [facility], [place] Director of the [facility] Lipid Disorders &  Cardiovascular Risk Reduction  Clinic Attending Cardiologist  Direct Dial: [phone]  Fax: [phone]  Website:  [email or web address] 11/10/2017, 8:17 AM

## 2017-12-04 ENCOUNTER — Encounter: Payer: Self-pay | Admitting: Internal Medicine

## 2017-12-04 ENCOUNTER — Ambulatory Visit (INDEPENDENT_AMBULATORY_CARE_PROVIDER_SITE_OTHER): Payer: 59 | Admitting: Internal Medicine

## 2017-12-04 VITALS — BP 136/88 | HR 59 | Ht 70.5 in | Wt 207.4 lb

## 2017-12-04 DIAGNOSIS — Z01818 Encounter for other preprocedural examination: Secondary | ICD-10-CM

## 2017-12-04 DIAGNOSIS — I10 Essential (primary) hypertension: Secondary | ICD-10-CM

## 2017-12-04 DIAGNOSIS — Z7901 Long term (current) use of anticoagulants: Secondary | ICD-10-CM

## 2017-12-04 DIAGNOSIS — D689 Coagulation defect, unspecified: Secondary | ICD-10-CM | POA: Diagnosis not present

## 2017-12-04 DIAGNOSIS — Z9989 Dependence on other enabling machines and devices: Secondary | ICD-10-CM

## 2017-12-04 DIAGNOSIS — G4733 Obstructive sleep apnea (adult) (pediatric): Secondary | ICD-10-CM | POA: Diagnosis not present

## 2017-12-04 DIAGNOSIS — R5383 Other fatigue: Secondary | ICD-10-CM | POA: Insufficient documentation

## 2017-12-04 DIAGNOSIS — I4892 Unspecified atrial flutter: Secondary | ICD-10-CM | POA: Diagnosis not present

## 2017-12-04 MED ORDER — RIVAROXABAN 20 MG PO TABS: 20.0000 mg | ORAL_TABLET | Freq: Every day | ORAL | 11 refills | Status: DC

## 2017-12-04 MED FILL — XARELTO 20 MG TABLET: 20 | 30 days supply | Qty: 30 | Fill #0

## 2017-12-04 NOTE — Progress Notes (Signed)
OFFICE NOTE  Chief Complaint:  Follow-up atrial flutter  Primary Care Physician: [name], MD  HPI:  Joe Nigh Sr. is a 83 year old gentleman with history of paroxysmal A-fib, mild nonobstructive coronary disease, hypertension and dyslipidemia. He also has CPAP and has done well with that. He remains active although has had a few pounds of weight gain, really has no symptoms. We talked about stroke prevention with regard to paroxysmal atrial fibrillation which he has had in the past and he was on Coumadin. He has since switched to Eliquis 5 mg by mouth twice a day and is tolerating this very well.  He denies any bleeding complications. He has not had any recurrent atrial fibrillation and he is aware of.  Overall he denies any chest pain or worsening shortness of breath. He does report some lower leg swelling, which is improved with wearing stockings.  He has no new complaints today. We obtained recent laboratory work shows an excellent cholesterol profile. Total cholesterol is 124, triglycerides 88, HDL 57 LDL 49.   Joe Lee  returns today. He is without any significant complaints.  Unfortunately, he recently had some lower GI bleeding. He underwent colonoscopy and there was a small polyp but no clear source for this. He since been started on iron, fiber, and a probiotic. He is in need of refills of his medications today. He continues on Eliquis and reports that the bleeding has subsided.  11/05/2016  Joe Lee returns today for follow-up. He has had no new complaints since I last saw him. He is due for repeat lab work. He said he recently saw his primary care provider who drew a lipid profile and check his blood sugar which apparently has been running high. He may need medication adjustment. I will obtain those results. He is also due for refills of his medications. He denies any recurrent atrial fibrillation. He's had no bleeding complications on Eliquis. He  denies any chest pain or worsening shortness of breath.  11/10/2017  Joe Lee was seen today for annual follow-up.  Overall he is without complaints except for some back pain.  He has fairly good energy level, but may have some fatigue attributed to aging.  He denies any chest pain or worsening shortness of breath.  His history of atrial fibrillation dates back to 2011 and he has been anticoagulated since then without recurrence.  Today an EKG was noted to be in atrial flutter with variable ventricular response at 77.  Blood pressure is elevated however he has not taken his medicines today.  When inquiring about his Eliquis use, he says he generally takes it twice a day but he has missed a few doses in the last couple of weeks.  We had a long discussion about atrial flutter and the differences between that and atrial fibrillation.  At this point is not clear that he is totally asymptomatic, however may be fatigue related to this.  He understands that his heart would work more efficiently if he were to be in rhythm.  12/04/2017  Joe Lee returns today for follow-up of atrial flutter.  He reports his symptoms have improved, particularly his fatigue.  Despite this he remains in atrial flutter with 4-1 AV block at a rate of about 60.  He denies any chest pain or shortness of breath.  He does say that he has missed at least one dose of his anticoagulation.  He has trouble remembering the evening dose of his Eliquis.  He felt  that a once daily medicine would help with his compliance.  We were going to plan for cardioversion however we will need to consider either TEE guided cardioversion or another trial of 3 weeks of anticoagulation.  Based on the holidays it may be worthwhile to wait a few more weeks.  PMHx:  Past Medical History:  Diagnosis Date  . AF (atrial fibrillation) ([place]) 06/06/2010   2D Echo EF>55% normal Echo  . Chronic constipation   . Colon polyp 1/07   adenomatous  . Diverticulitis     . GERD (gastroesophageal reflux disease)   . HTN (hypertension)   . Hyperlipidemia   . OSA on CPAP 11/09/2007   sleep study REM 12mins AHI was 3.64hr at 7cm  . SOB (shortness of breath) 05/30/2010   stress test normal stress test EF63%    Past Surgical History:  Procedure Laterality Date  . AV FISTULA REPAIR    . CARDIAC CATHETERIZATION  02/06/2007  . CATARACT EXTRACTION W/PHACO  07/28/2012   Procedure: CATARACT EXTRACTION PHACO AND INTRAOCULAR LENS PLACEMENT (IOC);  Surgeon: [name], MD;  Location: [facility];  Service: Ophthalmology;  Laterality: Right;  CDE=10.80  . CHOLECYSTECTOMY     Dr [name] -[facility]  . COLONOSCOPY N/A 07/23/2015   Procedure: COLONOSCOPY;  Surgeon: [name], MD;  [facility] ENDOSCOPY;  Service: Endoscopy;  Laterality: N/A;  . HEMORRHOID SURGERY    . HERNIA REPAIR    . KNEE SURGERY     right-arthroscopy  . ROTATOR CUFF REPAIR     right    FAMHx:  No family history on file.  SOCHx:   reports that he quit smoking about 40 years ago. His smoking use included cigarettes. He has a 1.50 pack-year smoking history. he has never used smokeless tobacco. He reports that he drinks about 4.8 oz of alcohol per week. He reports that he does not use drugs.  ALLERGIES:  Allergies  Allergen Reactions  . Penicillins Hives and Itching    ROS: Pertinent items noted in HPI and remainder of comprehensive ROS otherwise negative.  HOME MEDS: Current Outpatient Medications  Medication Sig Dispense Refill  . Alpha-D-Galactosidase (BEANO PO) Take 1 tablet by mouth daily as needed. For gas    . aspirin EC 81 MG tablet Take 1 tablet (81 mg total) by mouth daily.    Marland Kitchen diltiazem (CARTIA XT) 180 MG 24 hr capsule Take 1 capsule (180 mg total) by mouth daily. 90 capsule 3  . docusate sodium (COLACE) 100 MG capsule Take 100 mg by mouth daily as needed for mild constipation.    Marland Kitchen FIBER PO Take 500 mg by mouth daily.    . fish oil-omega-3 fatty acids 1000 MG capsule Take 1 g  by mouth daily.     Marland Kitchen GARLIC PO Take 1 capsule by mouth daily.     . IRON PO Take 27 mg by mouth daily.    . metoprolol tartrate (LOPRESSOR) 50 MG tablet Take 1 tablet (50 mg total) by mouth 2 (two) times daily. 180 tablet 3  . Multiple Vitamin (MULTIVITAMIN WITH MINERALS) TABS Take 1 tablet by mouth daily.    . Probiotic Product (PROBIOTIC DAILY) CAPS Take 1 capsule by mouth daily. 90 capsule 3  . simvastatin (ZOCOR) 20 MG tablet Take 1 tablet (20 mg total) by mouth at bedtime. 90 tablet 3  . rivaroxaban (XARELTO) 20 MG TABS tablet Take 1 tablet (20 mg total) by mouth daily with supper. 30 tablet 11   No  current facility-administered medications for this visit.     LABS/IMAGING: No results found for this or any previous visit (from the past 48 hour(s)). No results found.  VITALS: BP 136/88   Pulse (!) 59   Ht 5' 10.5" (1.791 m)   Wt 207 lb 6.4 oz (94.1 kg)   BMI 29.34 kg/m   EXAM: General appearance: alert, no distress and mildly obese Neck: no carotid bruit, no JVD and thyroid not enlarged, symmetric, no tenderness/mass/nodules Lungs: clear to auscultation bilaterally Heart: irregularly irregular rhythm Abdomen: soft, non-tender; bowel sounds normal; no masses,  no organomegaly Extremities: extremities normal, atraumatic, no cyanosis or edema Pulses: 2+ and symmetric Skin: Skin color, texture, turgor normal. No rashes or lesions Neurologic: Grossly normal Psych: Pleasant  EKG: Atrial flutter with 4-1 AV block at 60, incomplete right bundle branch block, nonspecific ST and T wave changes-personally reviewed  ASSESSMENT: 1. Atrial flutter - CHADSVASC score of 3 2. Paroxysmal atrial fibrillatioin 3. History of GIB on warfarin 4. Hypertension 5. Dyslipidemia - at goal 6. Obstructive sleep apnea on CPAP 7. Chronic venous insufficiency with edema  PLAN: 1.   [name] has had atrial flutter which is been persistent for over a month.  Unfortunately he is not compliant  with twice daily Eliquis.  I think he do better with once daily Xarelto.  We will change him today.  Plan follow-up with me in about a month.  We will go ahead and schedule him for cardioversion after 3 weeks of anticoagulation.  [name], MD, [facility], [place] Director of the Advanced Lipid Disorders &  Cardiovascular Risk Reduction Clinic Attending Cardiologist  Direct Dial: [phone]  Fax: [phone]  Website:  [email or web address] 12/04/2017, 10:04 AM

## 2017-12-04 NOTE — Patient Instructions (Addendum)
Dr. [name] has recommended you make the following change in your medication:  -- TAKE last dose of Eliquis TONIGHT 12/20 and START Xarelto 20mg  TOMORROW MORNING  -- Xarelto 20mg  is a ONCE DAILY medication   -- 3 bottles samples provided  Dr. [name] has requested that you have a Cardioversion - scheduled with him after January 4.  Electrical Cardioversion uses a jolt of electricity to your heart either through paddles or wired patches attached to your chest. This is a controlled, usually prescheduled, procedure. This procedure is done at the hospital and you are not awake during the procedure. You usually go home the day of the procedure. Please see the instruction sheet given to you today for more information. -- you will need to have a [facility] x-ray @ [facility] -- you will need to have blood work done about 1 week prior to your procedure @ [facility] in [place]  Dr. [name] recommends that you schedule a follow-up appointment with Dr. [name] about 3-4 weeks after cardioversion.

## 2017-12-04 NOTE — Addendum Note (Signed)
Addended by: Zebedee Iba on: 12/04/2017 12:49 PM   Modules accepted: Orders

## 2017-12-18 ENCOUNTER — Other Ambulatory Visit: Payer: Self-pay | Admitting: *Deleted

## 2017-12-18 DIAGNOSIS — I4892 Unspecified atrial flutter: Secondary | ICD-10-CM

## 2017-12-18 MED FILL — SIMVASTATIN 20 MG TABLET: 20 | 90 days supply | Qty: 90 | Fill #0

## 2017-12-24 DIAGNOSIS — M9903 Segmental and somatic dysfunction of lumbar region: Secondary | ICD-10-CM | POA: Diagnosis not present

## 2017-12-24 DIAGNOSIS — M9904 Segmental and somatic dysfunction of sacral region: Secondary | ICD-10-CM | POA: Diagnosis not present

## 2017-12-24 DIAGNOSIS — M545 Low back pain: Secondary | ICD-10-CM | POA: Diagnosis not present

## 2017-12-24 DIAGNOSIS — M5417 Radiculopathy, lumbosacral region: Secondary | ICD-10-CM | POA: Diagnosis not present

## 2017-12-24 DIAGNOSIS — M9901 Segmental and somatic dysfunction of cervical region: Secondary | ICD-10-CM | POA: Diagnosis not present

## 2017-12-25 ENCOUNTER — Ambulatory Visit (HOSPITAL_COMMUNITY)
Admission: RE | Admit: 2017-12-25 | Discharge: 2017-12-25 | Disposition: A | Discharge status: Home / Self Care | Payer: 59 | Source: Ambulatory Visit | Attending: Internal Medicine | Admitting: Internal Medicine

## 2017-12-25 DIAGNOSIS — J986 Disorders of diaphragm: Secondary | ICD-10-CM | POA: Diagnosis not present

## 2017-12-25 DIAGNOSIS — D689 Coagulation defect, unspecified: Secondary | ICD-10-CM | POA: Diagnosis not present

## 2017-12-25 DIAGNOSIS — J9811 Atelectasis: Secondary | ICD-10-CM | POA: Diagnosis not present

## 2017-12-25 DIAGNOSIS — Z7901 Long term (current) use of anticoagulants: Secondary | ICD-10-CM | POA: Diagnosis not present

## 2017-12-25 DIAGNOSIS — Z01818 Encounter for other preprocedural examination: Secondary | ICD-10-CM | POA: Insufficient documentation

## 2017-12-25 DIAGNOSIS — R5383 Other fatigue: Secondary | ICD-10-CM | POA: Diagnosis not present

## 2017-12-25 DIAGNOSIS — R0602 Shortness of breath: Secondary | ICD-10-CM | POA: Diagnosis not present

## 2017-12-26 LAB — CBC
Hematocrit: 41.4 % (ref 37.5–51.0)
Hemoglobin: 13.4 g/dL (ref 13.0–17.7)
MCH: 29.9 pg (ref 26.6–33.0)
MCHC: 32.4 g/dL (ref 31.5–35.7)
MCV: 92 fL (ref 79–97)
Platelets: 173 10*3/uL (ref 150–379)
RBC: 4.48 x10E6/uL (ref 4.14–5.80)
RDW: 15.4 % (ref 12.3–15.4)
WBC: 4.4 10*3/uL (ref 3.4–10.8)

## 2017-12-26 LAB — BASIC METABOLIC PANEL
BUN / CREAT RATIO: 10 (ref 10–24)
BUN: 14 mg/dL (ref 8–27)
CALCIUM: 10.3 mg/dL — AB (ref 8.6–10.2)
CHLORIDE: 106 mmol/L (ref 96–106)
CO2: 28 mmol/L (ref 20–29)
CREATININE: 1.34 mg/dL — AB (ref 0.76–1.27)
GFR calc Af Amer: 57 mL/min/{1.73_m2} — ABNORMAL LOW (ref >59)
GFR calc non Af Amer: 49 mL/min/{1.73_m2} — ABNORMAL LOW (ref >59)
GLUCOSE: 103 mg/dL — AB (ref 65–99)
Potassium: 4.9 mmol/L (ref 3.5–5.2)
Sodium: 146 mmol/L — ABNORMAL HIGH (ref 134–144)

## 2017-12-26 LAB — APTT: APTT: 40 s — AB (ref 24–33)

## 2017-12-26 LAB — PROTIME-INR
INR: 1.7 — AB (ref 0.8–1.2)
PROTHROMBIN TIME: 17.4 s — AB (ref 9.1–12.0)

## 2017-12-26 LAB — TSH: TSH: 1.21 u[IU]/mL (ref 0.450–4.500)

## 2018-01-01 ENCOUNTER — Other Ambulatory Visit: Payer: Self-pay

## 2018-01-01 ENCOUNTER — Encounter (HOSPITAL_COMMUNITY): Payer: Self-pay | Admitting: Emergency Medicine

## 2018-01-01 ENCOUNTER — Ambulatory Visit (HOSPITAL_COMMUNITY): Payer: 59 | Admitting: Certified Registered"

## 2018-01-01 ENCOUNTER — Encounter (HOSPITAL_COMMUNITY)
Admission: RE | Disposition: A | Discharge status: Home / Self Care | Payer: Self-pay | Source: Ambulatory Visit | Attending: Internal Medicine

## 2018-01-01 ENCOUNTER — Ambulatory Visit (HOSPITAL_COMMUNITY)
Admission: RE | Admit: 2018-01-01 | Discharge: 2018-01-01 | Disposition: A | Discharge status: Home / Self Care | Payer: 59 | Source: Ambulatory Visit | Attending: Internal Medicine | Admitting: Internal Medicine

## 2018-01-01 DIAGNOSIS — G4730 Sleep apnea, unspecified: Secondary | ICD-10-CM | POA: Insufficient documentation

## 2018-01-01 DIAGNOSIS — Z87891 Personal history of nicotine dependence: Secondary | ICD-10-CM | POA: Diagnosis not present

## 2018-01-01 DIAGNOSIS — I480 Paroxysmal atrial fibrillation: Secondary | ICD-10-CM | POA: Diagnosis not present

## 2018-01-01 DIAGNOSIS — I4892 Unspecified atrial flutter: Secondary | ICD-10-CM | POA: Diagnosis not present

## 2018-01-01 DIAGNOSIS — K219 Gastro-esophageal reflux disease without esophagitis: Secondary | ICD-10-CM | POA: Insufficient documentation

## 2018-01-01 DIAGNOSIS — K922 Gastrointestinal hemorrhage, unspecified: Secondary | ICD-10-CM | POA: Diagnosis not present

## 2018-01-01 DIAGNOSIS — I10 Essential (primary) hypertension: Secondary | ICD-10-CM | POA: Insufficient documentation

## 2018-01-01 DIAGNOSIS — I739 Peripheral vascular disease, unspecified: Secondary | ICD-10-CM | POA: Diagnosis not present

## 2018-01-01 HISTORY — PX: CARDIOVERSION: SHX1299

## 2018-01-01 SURGERY — CARDIOVERSION
Anesthesia: General

## 2018-01-01 MED ORDER — SODIUM CHLORIDE 0.9 % IV SOLN
INTRAVENOUS | Status: DC | PRN
  Administered 2018-01-01: 14:00:00 via INTRAVENOUS

## 2018-01-01 MED ORDER — SODIUM CHLORIDE 0.9 % IV SOLN: INTRAVENOUS | Status: DC

## 2018-01-01 MED ORDER — PROPOFOL 10 MG/ML IV BOLUS
INTRAVENOUS | Status: DC | PRN
  Administered 2018-01-01: 80 mg via INTRAVENOUS

## 2018-01-01 MED FILL — XARELTO 20 MG TABLET: 20 | 90 days supply | Qty: 90 | Fill #1

## 2018-01-01 NOTE — Anesthesia Preprocedure Evaluation (Signed)
Anesthesia Evaluation  Patient identified by MRN, date of birth, ID band Patient awake    Reviewed: Allergy & Precautions, NPO status , Patient's Chart, lab work & pertinent test results  Airway Mallampati: II  TM Distance: >3 FB     Dental   Pulmonary sleep apnea , former smoker,    breath sounds clear to auscultation       Cardiovascular hypertension, + Peripheral Vascular Disease   Rhythm:Regular Rate:Normal     Neuro/Psych    GI/Hepatic Neg liver ROS, GERD  ,  Endo/Other  negative endocrine ROS  Renal/GU negative Renal ROS     Musculoskeletal   Abdominal   Peds  Hematology   Anesthesia Other Findings   Reproductive/Obstetrics                             Anesthesia Physical Anesthesia Plan  ASA: III  Anesthesia Plan: General   Post-op Pain Management:    Induction: Intravenous  PONV Risk Score and Plan: 2 and Treatment may vary due to age or medical condition  Airway Management Planned: Simple Face Mask  Additional Equipment:   Intra-op Plan:   Post-operative Plan:   Informed Consent:   Dental advisory given  Plan Discussed with: CRNA, Anesthesiologist and Surgeon  Anesthesia Plan Comments:         Anesthesia Quick Evaluation

## 2018-01-01 NOTE — CV Procedure (Signed)
   CARDIOVERSION NOTE  Procedure: Electrical Cardioversion Indications:  Atrial Flutter  Procedure Details:  Consent: Risks of procedure as well as the alternatives and risks of each were explained to the (patient/caregiver).  Consent for procedure obtained.  Time Out: Verified patient identification, verified procedure, site/side was marked, verified correct patient position, special equipment/implants available, medications/allergies/relevent history reviewed, required imaging and test results available.  Performed  Patient placed on cardiac monitor, pulse oximetry, supplemental oxygen as necessary.  Sedation given: Propofol per anesthesia Pacer pads placed anterior and posterior chest.  Cardioverted 1 time(s).  Cardioverted at 120J biphasic.  Impression: Findings: Post procedure EKG shows: NSR Complications: None Patient did tolerate procedure well.  Plan: 1. Successful DCCV with a single 120J biphasic shock. 2. Follow-up with me in the office. Will update echo in the near future.  Time Spent Directly with the Patient:  30 minutes   [name], MD, [facility], [place] Director of the Advanced Lipid Disorders &  Cardiovascular Risk Reduction Clinic Diplomate of the American Board of Clinical Lipidology Attending Cardiologist  Direct Dial: [phone]  Fax: [phone]  Website:  [email or web address]  Nadean Corwin Baleigh Rennaker 01/01/2018, 2:25 PM

## 2018-01-01 NOTE — Discharge Instructions (Signed)
Electrical Cardioversion, Care After °This sheet gives you information about how to care for yourself after your procedure. Your health care provider may also give you more specific instructions. If you have problems or questions, contact your health care provider. °What can I expect after the procedure? °After the procedure, it is common to have: °· Some redness on the skin where the shocks were given. ° °Follow these instructions at home: °· Do not drive for 24 hours if you were given a medicine to help you relax (sedative). °· Take over-the-counter and prescription medicines only as told by your health care provider. °· Ask your health care provider how to check your pulse. Check it often. °· Rest for 48 hours after the procedure or as told by your health care provider. °· Avoid or limit your caffeine use as told by your health care provider. °Contact a health care provider if: °· You feel like your heart is beating too quickly or your pulse is not regular. °· You have a serious muscle cramp that does not go away. °Get help right away if: °· You have discomfort in your chest. °· You are dizzy or you feel faint. °· You have trouble breathing or you are short of breath. °· Your speech is slurred. °· You have trouble moving an arm or leg on one side of your body. °· Your fingers or toes turn cold or blue. °This information is not intended to replace advice given to you by your health care provider. Make sure you discuss any questions you have with your health care provider. °Document Released: 09/22/2013 Document Revised: 07/05/2016 Document Reviewed: 06/07/2016 °Elsevier Interactive Patient Education © 2018 [employer] ° °

## 2018-01-01 NOTE — Transfer of Care (Signed)
Immediate Anesthesia Transfer of Care Note  Patient: Joe Nigh Sr.  Procedure(s) Performed: CARDIOVERSION (N/A )  Patient Location: Endoscopy Unit  Anesthesia Type:General  Level of Consciousness: alert  and drowsy  Airway & Oxygen Therapy: Patient Spontanous Breathing  Post-op Assessment: Report given to RN, Post -op Vital signs reviewed and stable and Patient moving all extremities X 4  Post vital signs: Reviewed and stable  Last Vitals:  Vitals:   01/01/18 1245  BP: (!) 159/82  Pulse: (!) 118  Resp: 11  Temp: 36.5 C  SpO2: 100%    Last Pain: There were no vitals filed for this visit.       Complications: No apparent anesthesia complications

## 2018-01-01 NOTE — Anesthesia Postprocedure Evaluation (Signed)
Anesthesia Post Note  Patient: Joe Lee.  Procedure(s) Performed: CARDIOVERSION (N/A )     Patient location during evaluation: Endoscopy Anesthesia Type: General Level of consciousness: awake Pain management: pain level controlled Vital Signs Assessment: post-procedure vital signs reviewed and stable Respiratory status: spontaneous breathing Cardiovascular status: stable Anesthetic complications: no    Last Vitals:  Vitals:   01/01/18 1440 01/01/18 1448  BP: (!) 156/100 (!) 153/100  Pulse: 73 70  Resp: 18 10  Temp:    SpO2: 100% 100%    Last Pain:  Vitals:   01/01/18 1430  TempSrc: Oral                 Ravi Tuccillo

## 2018-01-01 NOTE — H&P (Signed)
   INTERVAL PROCEDURE H&P  History and Physical Interval Note:  01/01/2018 1:11 PM  Joe Nigh Sr. has presented today for their planned procedure. The various methods of treatment have been discussed with the patient and family. After consideration of risks, benefits and other options for treatment, the patient has consented to the procedure.  The patients' outpatient history has been reviewed, patient examined, and no change in status from most recent office note within the past 30 days. I have reviewed the patients' chart and labs and will proceed as planned. Questions were answered to the patient's satisfaction.   [name], MD, [facility], [place] Director of the Advanced Lipid Disorders &  Cardiovascular Risk Reduction Clinic Diplomate of the American Board of Clinical Lipidology Attending Cardiologist  Direct Dial: [phone]  Fax: [phone]  Website:  [email or web address] 01/01/2018, 1:11 PM

## 2018-01-05 ENCOUNTER — Encounter (HOSPITAL_COMMUNITY): Payer: Self-pay | Admitting: Internal Medicine

## 2018-01-28 ENCOUNTER — Ambulatory Visit (INDEPENDENT_AMBULATORY_CARE_PROVIDER_SITE_OTHER): Payer: 59 | Admitting: Physician Assistant

## 2018-01-28 ENCOUNTER — Ambulatory Visit: Payer: 59 | Admitting: Physician Assistant

## 2018-01-28 ENCOUNTER — Encounter: Payer: Self-pay | Admitting: Physician Assistant

## 2018-01-28 VITALS — BP 128/81 | HR 64 | Ht 70.0 in | Wt 210.8 lb

## 2018-01-28 DIAGNOSIS — J986 Disorders of diaphragm: Secondary | ICD-10-CM

## 2018-01-28 DIAGNOSIS — G4733 Obstructive sleep apnea (adult) (pediatric): Secondary | ICD-10-CM | POA: Diagnosis not present

## 2018-01-28 DIAGNOSIS — I10 Essential (primary) hypertension: Secondary | ICD-10-CM

## 2018-01-28 DIAGNOSIS — I340 Nonrheumatic mitral (valve) insufficiency: Secondary | ICD-10-CM | POA: Diagnosis not present

## 2018-01-28 DIAGNOSIS — I4892 Unspecified atrial flutter: Secondary | ICD-10-CM | POA: Diagnosis not present

## 2018-01-28 DIAGNOSIS — E785 Hyperlipidemia, unspecified: Secondary | ICD-10-CM

## 2018-01-28 DIAGNOSIS — I480 Paroxysmal atrial fibrillation: Secondary | ICD-10-CM | POA: Diagnosis not present

## 2018-01-28 NOTE — Addendum Note (Signed)
Addended by: Vennie Homans on: 01/28/2018 08:51 AM   Modules accepted: Orders

## 2018-01-28 NOTE — Progress Notes (Signed)
Cardiology Office Note    Date:  01/28/2018   ID:  Joe Nigh Sr., DOB 1934/05/03, MRN [id number]  PCP:  [name], MD  Cardiologist:  Dr. [name]  Chief Complaint  Patient presents with  . Follow-up    seen for Dr. [name], post cardioversion.    History of Present Illness:  Joe KAWECKI Sr. is a 84 y.o. male with PMH of PAF, mild nonobstructive CAD, HTN, HLD, elevated left hemidiaphram and OSA.  Previous echocardiogram was in June 2011 which showed EF > 55%, mild MR. Myoview on 05/30/2010 showed a normal EF with no ischemia.  Patient was on Coumadin in the past, however later has switched to Xarelto.  When he was seen for follow-up in November 2018, he was found to be in atrial flutter.  He had atrial fibrillation in the past, this is new for him.  He underwent outpatient DC cardioversion with successful conversion of atrial flutter to sinus rhythm on 01/01/2018 with a single 120 J biphasic shock.  Recent TSH obtained on 12/25/2017 was normal.  Patient presents today for cardiology office visit.  His blood pressure is well controlled, heart rate is 64.  He denies any significant palpitations since that cardioversion.  He has been compliant with his medication.  Although he does mention that he has not been using his CPAP for a long time. On physical exam, he had a diminished breath sounds in the left base, I compared his recent chest x-ray with his previous chest x-ray in 2017, it seems the patient has chronic elevated left hemidiaphragm.  Otherwise he does not have any signs of fluid accumulation.  He denies any recent exertional chest pain or shortness of breath.  He has been doing very well at home.  I will obtain echocardiogram given the recent onset of atrial flutter before his next office visit with Dr. [name].  I did not appreciate a significant heart murmur on physical exam.   Past Medical History:  Diagnosis Date  . AF (atrial fibrillation) ([place]) 06/06/2010   2D Echo EF>55%  normal Echo  . Chronic constipation   . Chronically elevated hemidiaphragm    elevated left hemidiaphragm  . Colon polyp 1/07   adenomatous  . Diverticulitis   . GERD (gastroesophageal reflux disease)   . HTN (hypertension)   . Hyperlipidemia   . OSA on CPAP 11/09/2007   sleep study REM 47mins AHI was 3.64hr at 7cm  . SOB (shortness of breath) 05/30/2010   stress test normal stress test EF63%    Past Surgical History:  Procedure Laterality Date  . AV FISTULA REPAIR    . CARDIAC CATHETERIZATION  02/06/2007  . CARDIOVERSION N/A 01/01/2018   Procedure: CARDIOVERSION;  Surgeon: [name], MD;  Location: [facility] ENDOSCOPY;  Service: Cardiovascular;  Laterality: N/A;  . CATARACT EXTRACTION W/PHACO  07/28/2012   Procedure: CATARACT EXTRACTION PHACO AND INTRAOCULAR LENS PLACEMENT ([place]);  Surgeon: [name], MD;  [facility]: [facility];  Service: Ophthalmology;  Laterality: Right;  CDE=10.80  . CHOLECYSTECTOMY     Dr [name] [facility]  . COLONOSCOPY N/A 07/23/2015   Procedure: COLONOSCOPY;  Surgeon: [name], MD;  [facility]COPY;  Service: Endoscopy;  Laterality: N/A;  . HEMORRHOID SURGERY    . HERNIA REPAIR    . KNEE SURGERY     right-arthroscopy  . ROTATOR CUFF REPAIR     right    Current Medications: Outpatient Medications Prior to Visit  Medication Sig Dispense Refill  .  acetaminophen (TYLENOL) 325 MG tablet Take 650 mg by mouth every 6 (six) hours as needed for moderate pain or headache.    . diltiazem (CARTIA XT) 180 MG 24 hr capsule Take 1 capsule (180 mg total) by mouth daily. 90 capsule 3  . docusate sodium (COLACE) 100 MG capsule Take 100 mg by mouth daily as needed for mild constipation.    . fish oil-omega-3 fatty acids 1000 MG capsule Take 1 g by mouth daily.     Marland Kitchen GARLIC PO Take 1 capsule by mouth daily.     . metoprolol tartrate (LOPRESSOR) 50 MG tablet Take 1 tablet (50 mg total) by mouth 2 (two) times daily. 180 tablet 3  . Multiple Vitamin (MULTIVITAMIN WITH  MINERALS) TABS Take 1 tablet by mouth daily.    . Probiotic Product (PROBIOTIC DAILY) CAPS Take 1 capsule by mouth daily. 90 capsule 3  . rivaroxaban (XARELTO) 20 MG TABS tablet Take 1 tablet (20 mg total) by mouth daily with supper. 30 tablet 11  . simvastatin (ZOCOR) 20 MG tablet Take 1 tablet (20 mg total) by mouth at bedtime. 90 tablet 3   No facility-administered medications prior to visit.      Allergies:   Penicillins   Social History   Socioeconomic History  . Marital status: Married    Spouse name: None  . Number of children: None  . Years of education: None  . Highest education level: None  Social Needs  . Financial resource strain: None  . Food insecurity - worry: None  . Food insecurity - inability: None  . Transportation needs - medical: None  . Transportation needs - non-medical: None  Occupational History  . None  Tobacco Use  . Smoking status: Former Smoker    Packs/day: 0.25    Years: 6.00    Pack years: 1.50    Types: Cigarettes    Last attempt to quit: 07/22/1977    Years since quitting: 40.5  . Smokeless tobacco: Never Used  Substance and Sexual Activity  . Alcohol use: Yes    Alcohol/week: 4.8 oz    Types: 7 Glasses of wine, 1 Cans of beer per week    Comment: 1 every 3-4 months, red wine  . Drug use: No  . Sexual activity: Yes    Birth control/protection: None  Other Topics Concern  . None  Social History Narrative  . None     Family History:  The patient's family history is not on file.   ROS:   Please see the history of present illness.    ROS All other systems reviewed and are negative.   PHYSICAL EXAM:   VS:  BP 128/81   Pulse 64   Ht 5\' 10"  (1.778 m)   Wt 210 lb 12.8 oz (95.6 kg)   BMI 30.25 kg/m    GEN: Well nourished, well developed, in no acute distress  HEENT: normal  Neck: no JVD, carotid bruits, or masses Cardiac: RRR; no murmurs, rubs, or gallops,no edema  Respiratory:  clear to auscultation bilaterally, normal work of  breathing GI: soft, nontender, nondistended, + BS MS: no deformity or atrophy  Skin: warm and dry, no rash Neuro:  Alert and Oriented x 3, Strength and sensation are intact Psych: euthymic mood, full affect  Wt Readings from Last 3 Encounters:  01/28/18 210 lb 12.8 oz (95.6 kg)  01/01/18 210 lb (95.3 kg)  12/04/17 207 lb 6.4 oz (94.1 kg)      Studies/Labs Reviewed:  EKG:  EKG is ordered today.  The ekg ordered today demonstrates atrial fibrillation, incomplete right bundle branch block, LVH.  Recent Labs: 12/25/2017: BUN 14; Creatinine, Ser 1.34; Hemoglobin 13.4; Platelets 173; Potassium 4.9; Sodium 146; TSH 1.210   Lipid Panel    Component Value Date/Time   CHOL 114 (L) 09/07/2015 0958   TRIG 59 09/07/2015 0958   HDL 61 09/07/2015 0958   CHOLHDL 1.9 09/07/2015 0958   VLDL 12 09/07/2015 0958   LDLCALC 41 09/07/2015 0958    Additional studies/ records that were reviewed today include:   Myoview 05/30/2010: normal EF, no ischemia  Echo 06/06/2010: normal EF, mild MR   ASSESSMENT:    1. Paroxysmal atrial fibrillation (HCC)   2. Atrial flutter, unspecified type ([place])   3. Essential hypertension   4. Hyperlipidemia, unspecified hyperlipidemia type   5. Chronically elevated hemidiaphragm   6. OSA (obstructive sleep apnea)   7. Mild mitral regurgitation      PLAN:  In order of problems listed above:  1. PAF: CHA2DS2-Vasc score 3 (age, HTN), very well rate controlled.  Currently maintaining sinus rhythm based on today's EKG.  Continue on Xarelto 20 mg daily, diltiazem and metoprolol tartrate.    2. Atrial flutter: First diagnosed in November 2018, recently underwent DC cardioversion in January 2019.  Maintaining sinus rhythm.  Already on Xarelto 20 mg daily for atrial fibrillation in the past.  Unless frequent recurrence, no need to consider ablation at this time.  Last echocardiogram was in 2011, will repeat another echocardiogram given the recent occurrence of atrial  flutter  3. Hypertension: Blood pressure very well controlled on metoprolol and diltiazem  4. Hyperlipidemia: On omega-3 fatty acid and also Zocor.  Last lipid panel in epic was in 2016, however according to the patient, his primary care provider has been checking it on a yearly basis.  Will defer to PCP.  5. Chronically elevated hemidiaphragm: Seen on multiple chest x-ray in the past, responsible for decreased air movement in the left base on physical exam.  Chronic in nature.  6. Mild MR: Seen on previous echo in 2011, however did not appreciate a significant murmur on today's physical exam.  Suspicion for significant valvular issue fairly low.  7. Obstructive sleep apnea: He has not used his CPAP in a while.  This will need to be addressed again at follow-up.  Question if he may need a repeat sleep study in the future.    Medication Adjustments/Labs and Tests Ordered: Current medicines are reviewed at length with the patient today.  Concerns regarding medicines are outlined above.  Medication changes, Labs and Tests ordered today are listed in the Patient Instructions below. Patient Instructions  Medication Instructions:  Continue current medications  If you need a refill on your cardiac medications before your next appointment, please call your pharmacy.  Labwork: None Ordered   Testing/Procedures: Your physician has requested that you have an echocardiogram. Echocardiography is a painless test that uses sound waves to create images of your heart. It provides your doctor with information about the size and shape of your heart and how well your heart's chambers and valves are working. This procedure takes approximately one hour. There are no restrictions for this procedure.   Follow-Up: Your physician wants you to follow-up in: 3 Months with Dr [name].     Thank you for choosing [facility] at [facility], [place], [place]  01/28/2018 8:47 AM  [facility] [address], [place], [place]  [address] Phone: [phone]; Fax: [phone]

## 2018-01-28 NOTE — Patient Instructions (Signed)
Medication Instructions:  Continue current medications  If you need a refill on your cardiac medications before your next appointment, please call your pharmacy.  Labwork: None Ordered   Testing/Procedures: Your physician has requested that you have an echocardiogram. Echocardiography is a painless test that uses sound waves to create images of your heart. It provides your doctor with information about the size and shape of your heart and how well your heart's chambers and valves are working. This procedure takes approximately one hour. There are no restrictions for this procedure.   Follow-Up: Your physician wants you to follow-up in: 3 Months with Dr [name].     Thank you for choosing [facility] at [facility]!!

## 2018-02-03 ENCOUNTER — Encounter (INDEPENDENT_AMBULATORY_CARE_PROVIDER_SITE_OTHER): Payer: Self-pay

## 2018-02-03 ENCOUNTER — Ambulatory Visit (HOSPITAL_COMMUNITY): Payer: 59 | Attending: Cardiology

## 2018-02-03 ENCOUNTER — Other Ambulatory Visit: Payer: Self-pay

## 2018-02-03 DIAGNOSIS — I4892 Unspecified atrial flutter: Secondary | ICD-10-CM | POA: Insufficient documentation

## 2018-02-03 DIAGNOSIS — I340 Nonrheumatic mitral (valve) insufficiency: Secondary | ICD-10-CM | POA: Insufficient documentation

## 2018-02-03 NOTE — Progress Notes (Signed)
Normal pumping function, mildly leaky mitral valve not unexpected with age and is not severe enough to be fixed.

## 2018-02-05 DIAGNOSIS — H1032 Unspecified acute conjunctivitis, left eye: Secondary | ICD-10-CM | POA: Diagnosis not present

## 2018-02-05 MED FILL — TOBRAMYCIN-DEXAMETH OPHTH S: 0.3-0.1 | 13 days supply | Qty: 3 | Fill #0

## 2018-02-10 DIAGNOSIS — Z1389 Encounter for screening for other disorder: Secondary | ICD-10-CM | POA: Diagnosis not present

## 2018-02-10 DIAGNOSIS — Z6831 Body mass index (BMI) 31.0-31.9, adult: Secondary | ICD-10-CM | POA: Diagnosis not present

## 2018-02-10 DIAGNOSIS — J069 Acute upper respiratory infection, unspecified: Secondary | ICD-10-CM | POA: Diagnosis not present

## 2018-02-10 DIAGNOSIS — M1991 Primary osteoarthritis, unspecified site: Secondary | ICD-10-CM | POA: Diagnosis not present

## 2018-02-10 DIAGNOSIS — M549 Dorsalgia, unspecified: Secondary | ICD-10-CM | POA: Diagnosis not present

## 2018-02-11 MED FILL — HYDROCODONE-CHLORPHEN ER SU: 10-8 | 12 days supply | Qty: 120 | Fill #0

## 2018-02-11 MED FILL — CARTIA XT 180 MG CAPSULE SA: 180 | 90 days supply | Qty: 90 | Fill #1

## 2018-02-17 DIAGNOSIS — M545 Low back pain: Secondary | ICD-10-CM | POA: Diagnosis not present

## 2018-02-17 DIAGNOSIS — M9901 Segmental and somatic dysfunction of cervical region: Secondary | ICD-10-CM | POA: Diagnosis not present

## 2018-02-27 MED FILL — METOPROLOL TARTRATE 50 MG T: 50 | 90 days supply | Qty: 180 | Fill #1

## 2018-03-02 ENCOUNTER — Ambulatory Visit (INDEPENDENT_AMBULATORY_CARE_PROVIDER_SITE_OTHER): Payer: 59 | Admitting: Otolaryngology

## 2018-03-17 MED FILL — TOBRAMYCIN-DEXAMETH OPHTH S: 0.3-0.1 | 13 days supply | Qty: 3 | Fill #1

## 2018-03-17 MED FILL — SIMVASTATIN 20 MG TABLET: 20 | 90 days supply | Qty: 90 | Fill #1

## 2018-03-26 MED FILL — XARELTO 20 MG TABLET: 20 | 30 days supply | Qty: 30 | Fill #2

## 2018-05-01 MED FILL — XARELTO 20 MG TABLET: 20 | 30 days supply | Qty: 30 | Fill #3

## 2018-05-19 MED FILL — CARTIA XT 180 MG CAPSULE SA: 180 | 90 days supply | Qty: 90 | Fill #2

## 2018-05-28 MED FILL — METOPROLOL TARTRATE 50 MG T: 50 | 90 days supply | Qty: 180 | Fill #2

## 2018-05-28 MED FILL — XARELTO 20 MG TABLET: 20 | 30 days supply | Qty: 30 | Fill #4

## 2018-06-08 MED FILL — SIMVASTATIN 20 MG TABLET: 20 | 90 days supply | Qty: 90 | Fill #2

## 2018-06-10 MED FILL — TAMSULOSIN HCL 0.4 MG CAP: 0.4 | 90 days supply | Qty: 90 | Fill #0

## 2018-07-01 MED FILL — XARELTO 20 MG TABLET: 20 | 30 days supply | Qty: 30 | Fill #5

## 2018-07-29 ENCOUNTER — Ambulatory Visit: Payer: Medicare Other | Admitting: Physician Assistant

## 2018-07-29 ENCOUNTER — Encounter: Payer: Self-pay | Admitting: Physician Assistant

## 2018-07-29 VITALS — BP 132/80 | HR 74 | Ht 70.0 in | Wt 215.0 lb

## 2018-07-29 DIAGNOSIS — I2510 Atherosclerotic heart disease of native coronary artery without angina pectoris: Secondary | ICD-10-CM | POA: Diagnosis not present

## 2018-07-29 DIAGNOSIS — I10 Essential (primary) hypertension: Secondary | ICD-10-CM | POA: Diagnosis not present

## 2018-07-29 DIAGNOSIS — E785 Hyperlipidemia, unspecified: Secondary | ICD-10-CM | POA: Diagnosis not present

## 2018-07-29 DIAGNOSIS — I480 Paroxysmal atrial fibrillation: Secondary | ICD-10-CM | POA: Diagnosis not present

## 2018-07-29 DIAGNOSIS — G4733 Obstructive sleep apnea (adult) (pediatric): Secondary | ICD-10-CM

## 2018-07-29 MED ORDER — RIVAROXABAN 20 MG PO TABS: 20.0000 mg | ORAL_TABLET | Freq: Every day | ORAL | 3 refills | Status: DC

## 2018-07-29 MED ORDER — SIMVASTATIN 20 MG PO TABS: 20.0000 mg | ORAL_TABLET | Freq: Every day | ORAL | 3 refills | Status: DC

## 2018-07-29 MED ORDER — METOPROLOL TARTRATE 50 MG PO TABS: 50.0000 mg | ORAL_TABLET | Freq: Two times a day (BID) | ORAL | 3 refills | Status: DC

## 2018-07-29 MED ORDER — DILTIAZEM HCL ER COATED BEADS 180 MG PO CP24: 180.0000 mg | ORAL_CAPSULE | Freq: Every day | ORAL | 3 refills | Status: DC

## 2018-07-29 MED FILL — XARELTO 20 MG TABLET: 20 | 90 days supply | Qty: 90 | Fill #0

## 2018-07-29 NOTE — Progress Notes (Signed)
Cardiology Office Note    Date:  07/29/2018   ID:  Joe Nigh Sr., DOB 08/13/1934, MRN [id number]  PCP:  [name], MD  Cardiologist:  Dr. [name]   Chief Complaint  Patient presents with  . Follow-up    6 months followup    History of Present Illness:  Joe OKANE Sr. is a 84 y.o. male with PMH of PAF, mild nonobstructive CAD, HTN, HLD, elevated left hemidiaphram and OSA.  Previous echocardiogram was in June 2011 which showed EF > 55%, mild MR. Myoview on 05/30/2010 showed a normal EF with no ischemia.  Patient was on Coumadin in the past, however later has switched to Xarelto.  When he was seen for follow-up in November 2018, he was found to be in atrial flutter.  He had atrial fibrillation in the past, this is new for him.  He underwent outpatient DC cardioversion with successful conversion of atrial flutter to sinus rhythm on 01/01/2018 with a single 120 J biphasic shock.  Recent TSH obtained on 12/25/2017 was normal.  I last saw the patient in February 2019, he was doing well at the time and maintaining sinus rhythm.  I repeated echocardiogram 02/03/2018 which showed EF 55 to 60%, grade 1 DD, trivial aortic regurgitation, mild MR. patient presents today for cardiology office visit.  He has not had any recurrent palpitation, chest pain, orthopnea or PND.  On physical exam, he actually have trace amount of lower extremity edema which is not bad.  I do not recommend a diuretic for this degree of edema.  Otherwise he is maintaining sinus rhythm.  We do not have any lipid panel since 2016 in our system, I will defer monitoring of his lipid and liver function to primary care provider.  Given history of nonobstructive coronary artery disease, his LDL goal is less than 70.   Past Medical History:  Diagnosis Date  . AF (atrial fibrillation) ([place]) 06/06/2010   2D Echo EF>55% normal Echo  . Chronic constipation   . Chronically elevated hemidiaphragm    elevated left hemidiaphragm  .  Colon polyp 1/07   adenomatous  . Diverticulitis   . GERD (gastroesophageal reflux disease)   . HTN (hypertension)   . Hyperlipidemia   . OSA on CPAP 11/09/2007   sleep study REM 32mins AHI was 3.64hr at 7cm  . SOB (shortness of breath) 05/30/2010   stress test normal stress test EF63%    Past Surgical History:  Procedure Laterality Date  . AV FISTULA REPAIR    . CARDIAC CATHETERIZATION  02/06/2007  . CARDIOVERSION N/A 01/01/2018   Procedure: CARDIOVERSION;  Surgeon: [name], MD;  Location: [facility] ENDOSCOPY;  Service: Cardiovascular;  Laterality: N/A;  . CATARACT EXTRACTION W/PHACO  07/28/2012   Procedure: CATARACT EXTRACTION PHACO AND INTRAOCULAR LENS PLACEMENT ([place]);  Surgeon: [name], MD;  Location: [facility] ORS;  Service: Ophthalmology;  Laterality: Right;  CDE=10.80  . CHOLECYSTECTOMY     Dr [name] -[facility]  . COLONOSCOPY N/A 07/23/2015   Procedure: COLONOSCOPY;  Surgeon: [name], MD;  Location: [facility] ENDOSCOPY;  Service: Endoscopy;  Laterality: N/A;  . HEMORRHOID SURGERY    . HERNIA REPAIR    . KNEE SURGERY     right-arthroscopy  . ROTATOR CUFF REPAIR     right    Current Medications: Outpatient Medications Prior to Visit  Medication Sig Dispense Refill  . acetaminophen (TYLENOL) 325 MG tablet Take 650 mg by mouth every 6 (six) hours as needed  for moderate pain or headache.    . docusate sodium (COLACE) 100 MG capsule Take 100 mg by mouth daily as needed for mild constipation.    . fish oil-omega-3 fatty acids 1000 MG capsule Take 1 g by mouth daily.     Marland Kitchen GARLIC PO Take 1 capsule by mouth daily.     . Multiple Vitamin (MULTIVITAMIN WITH MINERALS) TABS Take 1 tablet by mouth daily.    . Probiotic Product (PROBIOTIC DAILY) CAPS Take 1 capsule by mouth daily. 90 capsule 3  . diltiazem (CARTIA XT) 180 MG 24 hr capsule Take 1 capsule (180 mg total) by mouth daily. 90 capsule 3  . metoprolol tartrate (LOPRESSOR) 50 MG tablet Take 1 tablet (50 mg total) by mouth 2 (two)  times daily. 180 tablet 3  . rivaroxaban (XARELTO) 20 MG TABS tablet Take 1 tablet (20 mg total) by mouth daily with supper. 30 tablet 11  . simvastatin (ZOCOR) 20 MG tablet Take 1 tablet (20 mg total) by mouth at bedtime. 90 tablet 3   No facility-administered medications prior to visit.      Allergies:   Penicillins   Social History   Socioeconomic History  . Marital status: Married    Spouse name: Not on file  . Number of children: Not on file  . Years of education: Not on file  . Highest education level: Not on file  Occupational History  . Not on file  Social Needs  . Financial resource strain: Not on file  . Food insecurity:    Worry: Not on file    Inability: Not on file  . Transportation needs:    Medical: Not on file    Non-medical: Not on file  Tobacco Use  . Smoking status: Former Smoker    Packs/day: 0.25    Years: 6.00    Pack years: 1.50    Types: Cigarettes    Last attempt to quit: 07/22/1977    Years since quitting: 41.0  . Smokeless tobacco: Never Used  Substance and Sexual Activity  . Alcohol use: Yes    Alcohol/week: 8.0 standard drinks    Types: 7 Glasses of wine, 1 Cans of beer per week    Comment: 1 every 3-4 months, red wine  . Drug use: No  . Sexual activity: Yes    Birth control/protection: None  Lifestyle  . Physical activity:    Days per week: Not on file    Minutes per session: Not on file  . Stress: Not on file  Relationships  . Social connections:    Talks on phone: Not on file    Gets together: Not on file    Attends religious service: Not on file    Active member of club or organization: Not on file    Attends meetings of clubs or organizations: Not on file    Relationship status: Not on file  Other Topics Concern  . Not on file  Social History Narrative  . Not on file     Family History:  The patient's family history includes Hypertension in his brother and sister.   ROS:   Please see the history of present illness.      ROS All other systems reviewed and are negative.   PHYSICAL EXAM:   VS:  BP 132/80 (BP Location: Left Arm, Patient Position: Sitting, Cuff Size: Normal)   Pulse 74   Ht 5\' 10"  (1.778 m)   Wt 215 lb (97.5 kg)   BMI  30.85 kg/m    GEN: Well nourished, well developed, in no acute distress  HEENT: normal  Neck: no JVD, carotid bruits, or masses Cardiac: RRR; no murmurs, rubs, or gallops,no edema  Respiratory:  clear to auscultation bilaterally, normal work of breathing GI: soft, nontender, nondistended, + BS MS: no deformity or atrophy  Skin: warm and dry, no rash Neuro:  Alert and Oriented x 3, Strength and sensation are intact Psych: euthymic mood, full affect  Wt Readings from Last 3 Encounters:  07/29/18 215 lb (97.5 kg)  01/28/18 210 lb 12.8 oz (95.6 kg)  01/01/18 210 lb (95.3 kg)      Studies/Labs Reviewed:   EKG:  EKG is ordered today.  The ekg ordered today demonstrates normal sinus rhythm, occasional PVC  Recent Labs: 12/25/2017: BUN 14; Creatinine, Ser 1.34; Hemoglobin 13.4; Platelets 173; Potassium 4.9; Sodium 146; TSH 1.210   Lipid Panel    Component Value Date/Time   CHOL 114 (L) 09/07/2015 0958   TRIG 59 09/07/2015 0958   HDL 61 09/07/2015 0958   CHOLHDL 1.9 09/07/2015 0958   VLDL 12 09/07/2015 0958   LDLCALC 41 09/07/2015 0958    Additional studies/ records that were reviewed today include:   Echo 02/03/2018 LV EF: 55% -   60% Study Conclusions  - Left ventricle: The cavity size was normal. Wall thickness was   normal. Systolic function was normal. The estimated ejection   fraction was in the range of 55% to 60%. Wall motion was normal;   there were no regional wall motion abnormalities. Doppler   parameters are consistent with abnormal left ventricular   relaxation (grade 1 diastolic dysfunction). - Aortic valve: Valve mobility was restricted. There was trivial   regurgitation. - Mitral valve: Moderately calcified annulus. Mildly thickened    leaflets . There was mild regurgitation. - Right atrium: The atrium was mildly dilated.     ASSESSMENT:    1. Paroxysmal atrial fibrillation (HCC)   2. Coronary artery disease involving native coronary artery of native heart without angina pectoris   3. Essential hypertension   4. Hyperlipidemia, unspecified hyperlipidemia type   5. OSA (obstructive sleep apnea)      PLAN:  In order of problems listed above:  1. Paroxysmal atrial fibrillation: Previously underwent cardioversion earlier this year, has not had any further recurrence since.  Compliant with diltiazem, metoprolol and Xarelto.  Heart rate very well controlled.  2. CAD: Carries a diagnosis of nonobstructive CAD.  I am unable to locate previous cath report.  Patient has not had any exertional type of symptom.  Echocardiogram obtained in February 2019 showed normal ejection fraction.  Not on aspirin given the need for Xarelto.  3. Hypertension: Blood pressure stable on current therapy.  4. Hyperlipidemia: On Zocor 20 mg daily.  Will defer annual lipid panel and liver function test to primary care provider.  Given history of nonobstructive CAD, his LDL goal is less than 70.    Medication Adjustments/Labs and Tests Ordered: Current medicines are reviewed at length with the patient today.  Concerns regarding medicines are outlined above.  Medication changes, Labs and Tests ordered today are listed in the Patient Instructions below. Patient Instructions  Medication Instructions:  Your physician recommends that you continue on your current medications as directed. Please refer to the Current Medication list given to you today.  Your cardiac medications heave been refilled today  Labwork: None today  Testing/Procedures: None today  Follow-Up: Your physician wants you to follow-up in:  6-9 months with Dr.Hilty You will receive a reminder letter in the mail two months in advance. If you don't receive a letter, please call  our office to schedule the follow-up appointment.   Any Other Special Instructions Will Be Listed Below (If Applicable).     If you need a refill on your cardiac medications before your next appointment, please call your pharmacy.      Hilbert Corrigan, [place]  07/29/2018 10:11 AM    [facility] [address], [place], [place]  [address] Phone: [phone]; Fax: [phone]

## 2018-07-29 NOTE — Patient Instructions (Signed)
Medication Instructions:  Your physician recommends that you continue on your current medications as directed. Please refer to the Current Medication list given to you today.  Your cardiac medications heave been refilled today  Labwork: None today  Testing/Procedures: None today  Follow-Up: Your physician wants you to follow-up in: 6-9 months with Dr.Hilty You will receive a reminder letter in the mail two months in advance. If you don't receive a letter, please call our office to schedule the follow-up appointment.   Any Other Special Instructions Will Be Listed Below (If Applicable).     If you need a refill on your cardiac medications before your next appointment, please call your pharmacy.

## 2018-08-19 MED FILL — CARTIA XT 180 MG CAPSULE SA: 180 | 90 days supply | Qty: 90 | Fill #3

## 2018-08-31 MED FILL — METOPROLOL TARTRATE 50 MG T: 50 | 90 days supply | Qty: 180 | Fill #3

## 2018-09-04 MED FILL — TAMSULOSIN HCL 0.4 MG CAP: 0.4 | 90 days supply | Qty: 90 | Fill #1

## 2018-09-07 MED FILL — SIMVASTATIN 20 MG TABLET: 20 | 90 days supply | Qty: 90 | Fill #3

## 2018-10-27 MED FILL — XARELTO 20 MG TABLET: 20 | 90 days supply | Qty: 90 | Fill #1

## 2018-11-09 MED FILL — AZITHROMYCIN 250 MG TABLET: 250 | 5 days supply | Qty: 6 | Fill #0

## 2018-11-16 MED FILL — CARTIA XT 180 MG CAPSULE SA: 180 | 90 days supply | Qty: 90 | Fill #0

## 2018-11-27 MED FILL — METOPROLOL TARTRATE 50 MG T: 50 | 90 days supply | Qty: 180 | Fill #0

## 2018-12-01 MED FILL — TAMSULOSIN HCL 0.4 MG CAP: 0.4 | 90 days supply | Qty: 90 | Fill #0

## 2018-12-04 MED FILL — SIMVASTATIN 20 MG TABLET: 20 | 90 days supply | Qty: 90 | Fill #0

## 2019-01-26 MED FILL — XARELTO 20 MG TABLET: 20 | 90 days supply | Qty: 90 | Fill #2

## 2019-02-15 MED FILL — CARTIA XT 180 MG CAPSULE SA: 180 | 90 days supply | Qty: 90 | Fill #1

## 2019-02-18 ENCOUNTER — Encounter: Payer: Self-pay | Admitting: Internal Medicine

## 2019-02-18 ENCOUNTER — Ambulatory Visit (INDEPENDENT_AMBULATORY_CARE_PROVIDER_SITE_OTHER): Payer: Medicare Other | Admitting: Internal Medicine

## 2019-02-18 VITALS — BP 138/72 | HR 62 | Ht 69.5 in | Wt 211.8 lb

## 2019-02-18 DIAGNOSIS — I2510 Atherosclerotic heart disease of native coronary artery without angina pectoris: Secondary | ICD-10-CM | POA: Diagnosis not present

## 2019-02-18 DIAGNOSIS — I10 Essential (primary) hypertension: Secondary | ICD-10-CM

## 2019-02-18 DIAGNOSIS — E785 Hyperlipidemia, unspecified: Secondary | ICD-10-CM | POA: Diagnosis not present

## 2019-02-18 DIAGNOSIS — I480 Paroxysmal atrial fibrillation: Secondary | ICD-10-CM | POA: Diagnosis not present

## 2019-02-18 NOTE — Progress Notes (Signed)
OFFICE NOTE  Chief Complaint:  Follow-up atrial flutter  Primary Care Physician: [name], MD  HPI:  Joe Nigh Sr. is a 85 year old gentleman with history of paroxysmal A-fib, mild nonobstructive coronary disease, hypertension and dyslipidemia. He also has CPAP and has done well with that. He remains active although has had a few pounds of weight gain, really has no symptoms. We talked about stroke prevention with regard to paroxysmal atrial fibrillation which he has had in the past and he was on Coumadin. He has since switched to Eliquis 5 mg by mouth twice a day and is tolerating this very well.  He denies any bleeding complications. He has not had any recurrent atrial fibrillation and he is aware of.  Overall he denies any chest pain or worsening shortness of breath. He does report some lower leg swelling, which is improved with wearing stockings.  He has no new complaints today. We obtained recent laboratory work shows an excellent cholesterol profile. Total cholesterol is 124, triglycerides 88, HDL 57 LDL 49.   [name]  returns today. He is without any significant complaints.  Unfortunately, he recently had some lower GI bleeding. He underwent colonoscopy and there was a small polyp but no clear source for this. He since been started on iron, fiber, and a probiotic. He is in need of refills of his medications today. He continues on Eliquis and reports that the bleeding has subsided.  11/05/2016  Joe Lee returns today for follow-up. He has had no new complaints since I last saw him. He is due for repeat lab work. He said he recently saw his primary care provider who drew a lipid profile and check his blood sugar which apparently has been running high. He may need medication adjustment. I will obtain those results. He is also due for refills of his medications. He denies any recurrent atrial fibrillation. He's had no bleeding complications on Eliquis. He denies any  chest pain or worsening shortness of breath.  11/10/2017  Joe Lee was seen today for annual follow-up.  Overall he is without complaints except for some back pain.  He has fairly good energy level, but may have some fatigue attributed to aging.  He denies any chest pain or worsening shortness of breath.  His history of atrial fibrillation dates back to 2011 and he has been anticoagulated since then without recurrence.  Today an EKG was noted to be in atrial flutter with variable ventricular response at 77.  Blood pressure is elevated however he has not taken his medicines today.  When inquiring about his Eliquis use, he says he generally takes it twice a day but he has missed a few doses in the last couple of weeks.  We had a long discussion about atrial flutter and the differences between that and atrial fibrillation.  At this point is not clear that he is totally asymptomatic, however may be fatigue related to this.  He understands that his heart would work more efficiently if he were to be in rhythm.  12/04/2017  Joe Lee returns today for follow-up of atrial flutter.  He reports his symptoms have improved, particularly his fatigue.  Despite this he remains in atrial flutter with 4-1 AV block at a rate of about 60.  He denies any chest pain or shortness of breath.  He does say that he has missed at least one dose of his anticoagulation.  He has trouble remembering the evening dose of his Eliquis.  He felt  that a once daily medicine would help with his compliance.  We were going to plan for cardioversion however we will need to consider either TEE guided cardioversion or another trial of 3 weeks of anticoagulation.  Based on the holidays it may be worthwhile to wait a few more weeks.  02/18/2019  Joe Lee is seen today in follow-up.  He says is now retired from working at [employer].  He is been having problems with his back and is leery about surgery.  He seen a chiropractor with no significant  benefits.  Fortunately he is maintaining sinus rhythm now almost a year after cardioversion.  He has been compliant with Xarelto.  He has had no bleeding issues.  His cholesterol is well controlled, in fact is lipids last summer showed total cholesterol of 131, HDL 62, LDL 54 and triglycerides 74.  Hemoglobin A1c is 6.1 but not on medication.  Hemoglobin is stable with no signs of anemia.  He takes fish oil and advised him against that since there is no significant data supporting benefit and he is also on simvastatin.  PMHx:  Past Medical History:  Diagnosis Date  . AF (atrial fibrillation) ([place]) 06/06/2010   2D Echo EF>55% normal Echo  . Chronic constipation   . Chronically elevated hemidiaphragm    elevated left hemidiaphragm  . Colon polyp 1/07   adenomatous  . Diverticulitis   . GERD (gastroesophageal reflux disease)   . HTN (hypertension)   . Hyperlipidemia   . OSA on CPAP 11/09/2007   sleep study REM 36mins AHI was 3.64hr at 7cm  . SOB (shortness of breath) 05/30/2010   stress test normal stress test EF63%    Past Surgical History:  Procedure Laterality Date  . AV FISTULA REPAIR    . CARDIAC CATHETERIZATION  02/06/2007  . CARDIOVERSION N/A 01/01/2018   Procedure: CARDIOVERSION;  Surgeon: [name], MD;  Location: [facility] ENDOSCOPY;  Service: Cardiovascular;  Laterality: N/A;  . CATARACT EXTRACTION W/PHACO  07/28/2012   Procedure: CATARACT EXTRACTION PHACO AND INTRAOCULAR LENS PLACEMENT ([place]);  Surgeon: [name], MD;  Location: [facility] ORS;  Service: Ophthalmology;  Laterality: Right;  CDE=10.80  . CHOLECYSTECTOMY     Dr [name] -[facility]  . COLONOSCOPY N/A 07/23/2015   Procedure: COLONOSCOPY;  Surgeon: [name], MD;  Location: [facility] ENDOSCOPY;  Service: Endoscopy;  Laterality: N/A;  . HEMORRHOID SURGERY    . HERNIA REPAIR    . KNEE SURGERY     right-arthroscopy  . ROTATOR CUFF REPAIR     right    FAMHx:  Family History  Problem Relation Age of Onset  . Hypertension  Sister   . Hypertension Brother     SOCHx:   reports that he quit smoking about 41 years ago. His smoking use included cigarettes. He has a 1.50 pack-year smoking history. He has never used smokeless tobacco. He reports current alcohol use of about 8.0 standard drinks of alcohol per week. He reports that he does not use drugs.  ALLERGIES:  Allergies  Allergen Reactions  . Penicillins Hives, Itching and Other (See Comments)    Has patient had a PCN reaction causing immediate rash, facial/tongue/throat swelling, SOB or lightheadedness with hypotension: Yes Has patient had a PCN reaction causing severe rash involving mucus membranes or skin necrosis: No Has patient had a PCN reaction that required hospitalization: Yes Has patient had a PCN reaction occurring within the last 10 years: No If all of the above answers are "NO", then may  proceed with Cephalosporin use.     ROS: Pertinent items noted in HPI and remainder of comprehensive ROS otherwise negative.  HOME MEDS: Current Outpatient Medications  Medication Sig Dispense Refill  . acetaminophen (TYLENOL) 325 MG tablet Take 650 mg by mouth every 6 (six) hours as needed for moderate pain or headache.    . diltiazem (CARTIA XT) 180 MG 24 hr capsule Take 1 capsule (180 mg total) by mouth daily. 90 capsule 3  . docusate sodium (COLACE) 100 MG capsule Take 100 mg by mouth daily as needed for mild constipation.    . fish oil-omega-3 fatty acids 1000 MG capsule Take 1 g by mouth daily.     Marland Kitchen GARLIC PO Take 1 capsule by mouth daily.     . metoprolol tartrate (LOPRESSOR) 50 MG tablet Take 1 tablet (50 mg total) by mouth 2 (two) times daily. 180 tablet 3  . Multiple Vitamin (MULTIVITAMIN WITH MINERALS) TABS Take 1 tablet by mouth daily.    . Probiotic Product (PROBIOTIC DAILY) CAPS Take 1 capsule by mouth daily. 90 capsule 3  . rivaroxaban (XARELTO) 20 MG TABS tablet Take 1 tablet (20 mg total) by mouth daily with supper. 90 tablet 3  .  simvastatin (ZOCOR) 20 MG tablet Take 1 tablet (20 mg total) by mouth at bedtime. 90 tablet 3   No current facility-administered medications for this visit.     LABS/IMAGING: No results found for this or any previous visit (from the past 48 hour(s)). No results found.  VITALS: BP 138/72   Pulse 62   Ht 5' 9.5" (1.765 m)   Wt 211 lb 12.8 oz (96.1 kg)   BMI 30.83 kg/m   EXAM: General appearance: alert, no distress and mildly obese Neck: no carotid bruit, no JVD and thyroid not enlarged, symmetric, no tenderness/mass/nodules Lungs: clear to auscultation bilaterally Heart: irregularly irregular rhythm Abdomen: soft, non-tender; bowel sounds normal; no masses,  no organomegaly Extremities: extremities normal, atraumatic, no cyanosis or edema Pulses: 2+ and symmetric Skin: Skin color, texture, turgor normal. No rashes or lesions Neurologic: Grossly normal Psych: Pleasant  EKG: Sinus rhythm 62, RSR in V1-personally reviewed  ASSESSMENT: 1. Atrial flutter - CHADSVASC score of 3 2. Paroxysmal atrial fibrillatioin 3. History of GIB on warfarin 4. Hypertension 5. Dyslipidemia - at goal 6. Obstructive sleep apnea on CPAP 7. Chronic venous insufficiency with edema  PLAN: 1.   Joe Lee has a history of atrial flutter but has been maintaining sinus after cardioversion in 2019.  He has been tolerant of Xarelto and has had no issues of GI bleeding which she previously had on warfarin.  His blood pressure is at goal.  Cholesterol is also well treated.  I advised him to stop over-the-counter fish oil as there is no data supporting its benefit.  He is compliant with CPAP.  No changes were made to his medicines today.  Plan follow-up annually or sooner as necessary.  [name], MD, [facility], [place] Director of the Advanced Lipid Disorders &  Cardiovascular Risk Reduction Clinic Attending Cardiologist  Direct Dial: [phone]  Fax:  [phone]  Website:  [email or web address] [email or web address] 02/18/2019, 9:32 AM

## 2019-02-18 NOTE — Patient Instructions (Signed)
Medication Instructions:  Your Physician recommend you continue on your current medication as directed.    If you need a refill on your cardiac medications before your next appointment, please call your pharmacy.   Lab work: None  Testing/Procedures: None  Follow-Up: At [facility], you and your health needs are our priority.  As part of our continuing mission to provide you with exceptional heart care, we have created designated Provider Care Teams.  These Care Teams include your primary Cardiologist (physician) and Advanced Practice Providers (APPs -  Physician Assistants and Nurse Practitioners) who all work together to provide you with the care you need, when you need it. You will need a follow up appointment in 1 years.  Please call our office 2 months in advance to schedule this appointment.  You may see Dr. Hilty or one of the following Advanced Practice Providers on your designated Care Team: Hao Meng, PA-C . Angela Duke, PA-C     

## 2019-02-25 MED FILL — TAMSULOSIN HCL 0.4 MG CAP: 0.4 | 90 days supply | Qty: 90 | Fill #1

## 2019-02-25 MED FILL — METOPROLOL TARTRATE 50 MG T: 50 | 90 days supply | Qty: 180 | Fill #1

## 2019-03-10 MED FILL — SIMVASTATIN 20 MG TABLET: 20 | 90 days supply | Qty: 90 | Fill #0 | Status: TO

## 2019-04-26 MED FILL — XARELTO 20 MG TABLET: 20 | 90 days supply | Qty: 90 | Fill #0

## 2019-05-17 MED FILL — CARTIA XT 180 MG CAPSULE SA: 180 | 90 days supply | Qty: 90 | Fill #2

## 2019-05-18 MED FILL — TAMSULOSIN HCL 0.4 MG CAP: 0.4 | 90 days supply | Qty: 90 | Fill #0

## 2019-05-20 MED FILL — SIMVASTATIN 20 MG TABLET: 20 | 90 days supply | Qty: 90 | Fill #0

## 2019-05-27 MED FILL — METOPROLOL TARTRATE 50 MG T: 50 | 90 days supply | Qty: 180 | Fill #2

## 2019-06-17 MED FILL — PREDNISOLONE AC 1% EYE DROP: 1 | 25 days supply | Qty: 5 | Fill #0

## 2019-07-09 MED FILL — PREDNISOLONE AC 1% EYE DROP: 1 | 25 days supply | Qty: 5 | Fill #1

## 2019-07-23 ENCOUNTER — Other Ambulatory Visit: Payer: Self-pay | Admitting: Physician Assistant

## 2019-07-23 MED FILL — XARELTO 20 MG TABLET: 20 | 90 days supply | Qty: 90 | Fill #0

## 2019-07-23 NOTE — Telephone Encounter (Signed)
65 M, 96.1 kg, SCr 1.18 (05/2019) CrCl 67.5, LOV [facility] 02/2019

## 2019-08-16 ENCOUNTER — Other Ambulatory Visit: Payer: Self-pay | Admitting: Physician Assistant

## 2019-08-16 MED FILL — SIMVASTATIN 20 MG TABLET: 20 | 90 days supply | Qty: 90 | Fill #0

## 2019-08-16 MED FILL — DILTIAZEM HCL ER COATED BEA: 180 | 90 days supply | Qty: 90 | Fill #0

## 2019-08-24 ENCOUNTER — Other Ambulatory Visit: Payer: Self-pay | Admitting: Physician Assistant

## 2019-08-24 MED FILL — METOPROLOL TARTRATE 50 MG T: 50 | 90 days supply | Qty: 180 | Fill #0

## 2019-09-06 ENCOUNTER — Telehealth: Payer: Self-pay | Admitting: Internal Medicine

## 2019-09-06 MED ORDER — RIVAROXABAN 20 MG PO TABS: ORAL_TABLET | ORAL | 1 refills | Status: DC

## 2019-09-06 MED ORDER — DILTIAZEM HCL ER COATED BEADS 180 MG PO CP24: 180.0000 mg | ORAL_CAPSULE | Freq: Every day | ORAL | 2 refills | Status: DC

## 2019-09-06 MED ORDER — METOPROLOL TARTRATE 50 MG PO TABS: 50.0000 mg | ORAL_TABLET | Freq: Two times a day (BID) | ORAL | 2 refills | Status: DC

## 2019-09-06 MED ORDER — SIMVASTATIN 20 MG PO TABS: 20.0000 mg | ORAL_TABLET | Freq: Every day | ORAL | 2 refills | Status: DC

## 2019-09-06 MED FILL — TAMSULOSIN HCL 0.4 MG CAP: 0.4 | 90 days supply | Qty: 90 | Fill #1

## 2019-09-06 NOTE — Telephone Encounter (Signed)
8m 96.1kg Scr 1.180 06/10/19 ccr 64.34mlmin Lovw/hilty 02/18/19

## 2019-09-06 NOTE — Telephone Encounter (Signed)
°*  STAT* If patient is at the pharmacy, call can be transferred to refill team.   1. Which medications need to be refilled? (please list name of each medication and dose if known)  CARTIA XT 180 MG 24 hr capsule XARELTO 20 MG TABS tablet metoprolol tartrate (LOPRESSOR) 50 MG tablet simvastatin (ZOCOR) 20 MG tablet  2. Which pharmacy/location (including street and city if local pharmacy) is medication to be sent to?  [place], [place].  3. Do they need a 30 day or 90 day supply? 90  Patient was told by the pharmacy to make an appointment to get all of his medications refilled. He recently had some filled, but would like to get all of his meds at the same time. He would like to get his meds for 90 days instead of 30

## 2019-10-21 MED FILL — XARELTO 20 MG TABLET: 20 | 90 days supply | Qty: 90 | Fill #1

## 2019-11-16 MED FILL — CARTIA XT 180 MG CAPSULE SA: 180 | 90 days supply | Qty: 90 | Fill #1

## 2019-11-16 MED FILL — SIMVASTATIN 20 MG TABLET: 20 | 90 days supply | Qty: 90 | Fill #1

## 2019-11-24 MED FILL — METOPROLOL TARTRATE 50 MG T: 50 | 90 days supply | Qty: 180 | Fill #1

## 2019-11-24 MED FILL — TAMSULOSIN HCL 0.4 MG CAP: 0.4 | 90 days supply | Qty: 90 | Fill #0

## 2019-12-02 ENCOUNTER — Other Ambulatory Visit: Payer: Self-pay

## 2019-12-02 MED ORDER — SIMVASTATIN 20 MG PO TABS: 20.0000 mg | ORAL_TABLET | Freq: Every day | ORAL | 0 refills | Status: DC

## 2019-12-02 MED ORDER — METOPROLOL TARTRATE 50 MG PO TABS: 50.0000 mg | ORAL_TABLET | Freq: Two times a day (BID) | ORAL | 0 refills | Status: DC

## 2019-12-02 MED ORDER — RIVAROXABAN 20 MG PO TABS: ORAL_TABLET | ORAL | 0 refills | Status: DC

## 2019-12-02 MED ORDER — RIVAROXABAN 20 MG PO TABS: 20.0000 mg | ORAL_TABLET | Freq: Every day | ORAL | 0 refills | Status: DC

## 2019-12-02 MED ORDER — DILTIAZEM HCL ER COATED BEADS 180 MG PO CP24: 180.0000 mg | ORAL_CAPSULE | Freq: Every day | ORAL | 0 refills | Status: DC

## 2019-12-06 ENCOUNTER — Other Ambulatory Visit: Payer: Self-pay

## 2019-12-06 MED ORDER — SIMVASTATIN 20 MG PO TABS: 20.0000 mg | ORAL_TABLET | Freq: Every day | ORAL | 0 refills | Status: DC

## 2019-12-06 MED ORDER — RIVAROXABAN 20 MG PO TABS: 20.0000 mg | ORAL_TABLET | Freq: Every day | ORAL | 0 refills | Status: DC

## 2020-01-14 ENCOUNTER — Ambulatory Visit: Payer: Medicare Other

## 2020-01-19 ENCOUNTER — Ambulatory Visit: Payer: Medicare Other

## 2020-01-22 ENCOUNTER — Ambulatory Visit: Payer: Medicare PPO

## 2020-02-03 ENCOUNTER — Encounter: Payer: Self-pay | Admitting: Internal Medicine

## 2020-02-03 NOTE — Telephone Encounter (Signed)
Opened in error

## 2020-02-18 ENCOUNTER — Ambulatory Visit: Payer: Medicare Other | Admitting: Internal Medicine

## 2020-03-09 ENCOUNTER — Encounter: Payer: Self-pay | Admitting: Internal Medicine

## 2020-03-09 ENCOUNTER — Telehealth: Payer: Self-pay | Admitting: Internal Medicine

## 2020-03-09 ENCOUNTER — Other Ambulatory Visit: Payer: Self-pay

## 2020-03-09 ENCOUNTER — Ambulatory Visit: Payer: Medicare PPO | Admitting: Internal Medicine

## 2020-03-09 VITALS — BP 132/78 | HR 72 | Temp 97.2°F | Ht 71.0 in | Wt 211.6 lb

## 2020-03-09 DIAGNOSIS — G4733 Obstructive sleep apnea (adult) (pediatric): Secondary | ICD-10-CM

## 2020-03-09 DIAGNOSIS — I10 Essential (primary) hypertension: Secondary | ICD-10-CM

## 2020-03-09 DIAGNOSIS — I2510 Atherosclerotic heart disease of native coronary artery without angina pectoris: Secondary | ICD-10-CM

## 2020-03-09 DIAGNOSIS — I480 Paroxysmal atrial fibrillation: Secondary | ICD-10-CM

## 2020-03-09 NOTE — Patient Instructions (Signed)

## 2020-03-09 NOTE — Telephone Encounter (Signed)
Patient in office 03/09/20 for visit with MD No xarelto samples available Provided patient with xarelto patient assistance application, instructed to complete highlighted sections & return to office if he wishes to apply

## 2020-03-09 NOTE — Progress Notes (Signed)
OFFICE NOTE  Chief Complaint:  Follow-up  Primary Care Physician: [name], MD  HPI:  Joe Nigh Sr. is a 86 year old gentleman with history of paroxysmal A-fib, mild nonobstructive coronary disease, hypertension and dyslipidemia. He also has CPAP and has done well with that. He remains active although has had a few pounds of weight gain, really has no symptoms. We talked about stroke prevention with regard to paroxysmal atrial fibrillation which he has had in the past and he was on Coumadin. He has since switched to Eliquis 5 mg by mouth twice a day and is tolerating this very well.  He denies any bleeding complications. He has not had any recurrent atrial fibrillation and he is aware of.  Overall he denies any chest pain or worsening shortness of breath. He does report some lower leg swelling, which is improved with wearing stockings.  He has no new complaints today. We obtained recent laboratory work shows an excellent cholesterol profile. Total cholesterol is 124, triglycerides 88, HDL 57 LDL 49.   Joe Lee  returns today. He is without any significant complaints.  Unfortunately, he recently had some lower GI bleeding. He underwent colonoscopy and there was a small polyp but no clear source for this. He since been started on iron, fiber, and a probiotic. He is in need of refills of his medications today. He continues on Eliquis and reports that the bleeding has subsided.  11/05/2016  Joe Lee returns today for follow-up. He has had no new complaints since I last saw him. He is due for repeat lab work. He said he recently saw his primary care provider who drew a lipid profile and check his blood sugar which apparently has been Lee high. He may need medication adjustment. I will obtain those results. He is also due for refills of his medications. He denies any recurrent atrial fibrillation. He's had no bleeding complications on Eliquis. He denies any chest pain or  worsening shortness of breath.  11/10/2017  Joe Lee was seen today for annual follow-up.  Overall he is without complaints except for some back pain.  He has fairly good energy level, but may have some fatigue attributed to aging.  He denies any chest pain or worsening shortness of breath.  His history of atrial fibrillation dates back to 2011 and he has been anticoagulated since then without recurrence.  Today an EKG was noted to be in atrial flutter with variable ventricular response at 77.  Blood pressure is elevated however he has not taken his medicines today.  When inquiring about his Eliquis use, he says he generally takes it twice a day but he has missed a few doses in the last couple of weeks.  We had a long discussion about atrial flutter and the differences between that and atrial fibrillation.  At this point is not clear that he is totally asymptomatic, however may be fatigue related to this.  He understands that his heart would work more efficiently if he were to be in rhythm.  12/04/2017  Joe Lee returns today for follow-up of atrial flutter.  He reports his symptoms have improved, particularly his fatigue.  Despite this he remains in atrial flutter with 4-1 AV block at a rate of about 60.  He denies any chest pain or shortness of breath.  He does say that he has missed at least one dose of his anticoagulation.  He has trouble remembering the evening dose of his Eliquis.  He felt that a  once daily medicine would help with his compliance.  We were going to plan for cardioversion however we will need to consider either TEE guided cardioversion or another trial of 3 weeks of anticoagulation.  Based on the holidays it may be worthwhile to wait a few more weeks.  02/18/2019  [name] is seen today in follow-up.  He says is now retired from working at [employer].  He is been having problems with his back and is leery about surgery.  He seen a chiropractor with no significant benefits.   Fortunately he is maintaining sinus rhythm now almost a year after cardioversion.  He has been compliant with Xarelto.  He has had no bleeding issues.  His cholesterol is well controlled, in fact is lipids last summer showed total cholesterol of 131, HDL 62, LDL 54 and triglycerides 74.  Hemoglobin A1c is 6.1 but not on medication.  Hemoglobin is stable with no signs of anemia.  He takes fish oil and advised him against that since there is no significant data supporting benefit and he is also on simvastatin.  03/09/2020  Joe Lee seen today in follow-up.  Overall he is doing well.  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  In general he feels well.  He denies any recurrent atrial flutter.  Cholesterol has been well controlled and his blood pressure is at goal.  He denies any shortness of breath or chest pain.  Labs from December 2020 showed total cholesterol 126, HDL 64, LDL 48 and triglycerides 65.  Hemoglobin A1c of 5.6.  PMHx:  Past Medical History:  Diagnosis Date  . AF (atrial fibrillation) ([place]) 06/06/2010   2D Echo EF>55% normal Echo  . Chronic constipation   . Chronically elevated hemidiaphragm    elevated left hemidiaphragm  . Colon polyp 1/07   adenomatous  . Diverticulitis   . GERD (gastroesophageal reflux disease)   . HTN (hypertension)   . Hyperlipidemia   . OSA on CPAP 11/09/2007   sleep study REM 41mins AHI was 3.64hr at 7cm  . SOB (shortness of breath) 05/30/2010   stress test normal stress test EF63%    Past Surgical History:  Procedure Laterality Date  . AV FISTULA REPAIR    . CARDIAC CATHETERIZATION  02/06/2007  . CARDIOVERSION N/A 01/01/2018   Procedure: CARDIOVERSION;  Surgeon: [name], MD;  Location: [facility] ENDOSCOPY;  Service: Cardiovascular;  Laterality: N/A;  . CATARACT EXTRACTION W/PHACO  07/28/2012   Procedure: CATARACT EXTRACTION PHACO AND INTRAOCULAR LENS PLACEMENT ([place]);  Surgeon: [name], MD;  Location: [facility] ORS;  Service:  Ophthalmology;  Laterality: Right;  CDE=10.80  . CHOLECYSTECTOMY     Dr [name] -[facility]  . COLONOSCOPY N/A 07/23/2015   Procedure: COLONOSCOPY;  Surgeon: [name], MD;  Location: [facility] ENDOSCOPY;  Service: Endoscopy;  Laterality: N/A;  . HEMORRHOID SURGERY    . HERNIA REPAIR    . KNEE SURGERY     right-arthroscopy  . ROTATOR CUFF REPAIR     right    FAMHx:  Family History  Problem Relation Age of Onset  . Hypertension Sister   . Hypertension Brother     SOCHx:   reports that he quit smoking about 42 years ago. His smoking use included cigarettes. He has a 1.50 pack-year smoking history. He has never used smokeless tobacco. He reports current alcohol use of about 8.0 standard drinks of alcohol per week. He reports that he does not use drugs.  ALLERGIES:  Allergies  Allergen Reactions  . Penicillins Hives, Itching and Other (See Comments)    Has patient had a PCN reaction causing immediate rash, facial/tongue/throat swelling, SOB or lightheadedness with hypotension: Yes Has patient had a PCN reaction causing severe rash involving mucus membranes or skin necrosis: No Has patient had a PCN reaction that required hospitalization: Yes Has patient had a PCN reaction occurring within the last 10 years: No If all of the above answers are "NO", then may proceed with Cephalosporin use.     ROS: Pertinent items noted in HPI and remainder of comprehensive ROS otherwise negative.  HOME MEDS: Current Outpatient Medications  Medication Sig Dispense Refill  . acetaminophen (TYLENOL) 325 MG tablet Take 650 mg by mouth every 6 (six) hours as needed for moderate pain or headache.    . diltiazem (CARTIA XT) 180 MG 24 hr capsule Take 1 capsule (180 mg total) by mouth daily. 90 capsule 0  . docusate sodium (COLACE) 100 MG capsule Take 100 mg by mouth daily as needed for mild constipation.    Marland Kitchen GARLIC PO Take [id number] mg by mouth daily.     . metoprolol tartrate (LOPRESSOR) 50 MG tablet Take 1 tablet  (50 mg total) by mouth 2 (two) times daily. 180 tablet 0  . Multiple Vitamin (MULTIVITAMIN WITH MINERALS) TABS Take 1 tablet by mouth daily.    . Probiotic Product (PROBIOTIC DAILY) CAPS Take 1 capsule by mouth daily. 90 capsule 3  . rivaroxaban (XARELTO) 20 MG TABS tablet Take 1 tablet (20 mg total) by mouth daily with supper. LAB WORK NEEDED FOR FURTHER REFILLS 60 tablet 0  . Saw Palmetto 450 MG CAPS Take 1 capsule by mouth daily.    . simvastatin (ZOCOR) 20 MG tablet Take 1 tablet (20 mg total) by mouth at bedtime. 90 tablet 0  . tamsulosin (FLOMAX) 0.4 MG CAPS capsule Take 0.4 mg by mouth daily.     No current facility-administered medications for this visit.    LABS/IMAGING: No results found for this or any previous visit (from the past 48 hour(s)). No results found.  VITALS: BP 132/78   Pulse 72   Temp (!) 97.2 F (36.2 C)   Ht 5\' 11"  (1.803 m)   Wt 211 lb 9.6 oz (96 kg)   SpO2 99%   BMI 29.51 kg/m   EXAM: General appearance: alert, no distress and mildly obese Neck: no carotid bruit, no JVD and thyroid not enlarged, symmetric, no tenderness/mass/nodules Lungs: clear to auscultation bilaterally Heart: irregularly irregular rhythm Abdomen: soft, non-tender; bowel sounds normal; no masses,  no organomegaly Extremities: extremities normal, atraumatic, no cyanosis or edema Pulses: 2+ and symmetric Skin: Skin color, texture, turgor normal. No rashes or lesions Neurologic: Grossly normal Psych: Pleasant  EKG: Normal sinus rhythm, incomplete right bundle branch block at 68-personally reviewed  ASSESSMENT: 1. Atrial flutter - CHADSVASC score of 3 2. Paroxysmal atrial fibrillatioin 3. History of GIB on warfarin 4. Hypertension 5. Dyslipidemia - at goal 6. Obstructive sleep apnea on CPAP 7. Chronic venous insufficiency with edema  PLAN: 1.   Mr. Mckeough continues to do well without any recurrent A. fib flutter.  He denies palpitations.  He denies any bleeding  difficulty on Xarelto.  Blood pressure is well controlled.  His cholesterol is at goal.  He reports compliance with CPAP.  He has been more physically active and now is working a part-time job in his retirement from [employer].  Overall he seems very happy.  No changes  to his medicines today.  Follow-up annually or sooner as necessary.  [name], MD, [facility], [place] Director of the Advanced Lipid Disorders &  Cardiovascular Risk Reduction Clinic Attending Cardiologist  Direct Dial: [phone]  Fax: [phone]  Website:  [email or web address] 03/09/2020, 11:14 AM

## 2020-03-20 ENCOUNTER — Other Ambulatory Visit: Payer: Self-pay

## 2020-03-20 MED ORDER — RIVAROXABAN 20 MG PO TABS: 20.0000 mg | ORAL_TABLET | Freq: Every day | ORAL | 1 refills | Status: DC

## 2020-05-09 ENCOUNTER — Other Ambulatory Visit: Payer: Self-pay | Admitting: Physician Assistant

## 2020-05-10 ENCOUNTER — Other Ambulatory Visit: Payer: Self-pay | Admitting: Physician Assistant

## 2020-05-11 NOTE — Telephone Encounter (Signed)
Rx has been sent to the pharmacy electronically. ° °

## 2020-05-24 ENCOUNTER — Other Ambulatory Visit: Payer: Self-pay | Admitting: Internal Medicine

## 2020-05-24 ENCOUNTER — Telehealth: Payer: Self-pay | Admitting: Internal Medicine

## 2020-05-24 MED ORDER — METOPROLOL TARTRATE 50 MG PO TABS: 50.0000 mg | ORAL_TABLET | Freq: Two times a day (BID) | ORAL | 2 refills | Status: DC

## 2020-05-24 MED ORDER — METOPROLOL TARTRATE 50 MG PO TABS: 50.0000 mg | ORAL_TABLET | Freq: Two times a day (BID) | ORAL | 0 refills | Status: DC

## 2020-05-24 NOTE — Telephone Encounter (Signed)
Patient calling the office for samples of medication:   1.  What medication and dosage are you requesting samples for? metoprolol tartrate (LOPRESSOR) 50 MG tablet  2.  Are you currently out of this medication? HE IS ALMOST OUT OF MEDICATION

## 2020-05-24 NOTE — Telephone Encounter (Signed)
Attempt to return call-line busy.  reattempted-continues to ring without answer or going to VM.    Refill request in as well.  Can send to local pharmacy until receives mail order.

## 2020-05-24 NOTE — Telephone Encounter (Signed)
Spoke to patient, 30 day rx sent to local pharmacy and 90 day sent to [facility] mail order.    Samples of Xarelto placed at front desk for pick up

## 2020-05-24 NOTE — Telephone Encounter (Signed)
*  STAT* If patient is at the pharmacy, call can be transferred to refill team.   1. Which medications need to be refilled? (please list name of each medication and dose if known) metoprolol tartrate (LOPRESSOR) 50 MG tablet  2. Which pharmacy/location (including street and city if local pharmacy) is medication to be sent to? [place], [place] - [address] [facility] - [place], [place]  3. Do they need a 30 day or 90 day supply? 14 day supply to [facility]       90 day supply sent to [facility]

## 2020-06-22 DIAGNOSIS — Z0000 Encounter for general adult medical examination without abnormal findings: Secondary | ICD-10-CM | POA: Diagnosis not present

## 2020-06-22 DIAGNOSIS — Z1389 Encounter for screening for other disorder: Secondary | ICD-10-CM | POA: Diagnosis not present

## 2020-06-22 DIAGNOSIS — I10 Essential (primary) hypertension: Secondary | ICD-10-CM | POA: Diagnosis not present

## 2020-06-22 DIAGNOSIS — M549 Dorsalgia, unspecified: Secondary | ICD-10-CM | POA: Diagnosis not present

## 2020-06-22 DIAGNOSIS — Z6830 Body mass index (BMI) 30.0-30.9, adult: Secondary | ICD-10-CM | POA: Diagnosis not present

## 2020-06-22 DIAGNOSIS — E6609 Other obesity due to excess calories: Secondary | ICD-10-CM | POA: Diagnosis not present

## 2020-06-22 DIAGNOSIS — E782 Mixed hyperlipidemia: Secondary | ICD-10-CM | POA: Diagnosis not present

## 2020-06-22 DIAGNOSIS — G4733 Obstructive sleep apnea (adult) (pediatric): Secondary | ICD-10-CM | POA: Diagnosis not present

## 2020-09-07 DIAGNOSIS — Z23 Encounter for immunization: Secondary | ICD-10-CM | POA: Diagnosis not present

## 2020-10-23 DIAGNOSIS — E6609 Other obesity due to excess calories: Secondary | ICD-10-CM | POA: Diagnosis not present

## 2020-10-23 DIAGNOSIS — R310 Gross hematuria: Secondary | ICD-10-CM | POA: Diagnosis not present

## 2020-10-23 DIAGNOSIS — Z6830 Body mass index (BMI) 30.0-30.9, adult: Secondary | ICD-10-CM | POA: Diagnosis not present

## 2020-10-23 MED FILL — CIPROFLOXACIN HCL 500 MG TA: 500 | 7 days supply | Qty: 14 | Fill #0

## 2020-10-30 ENCOUNTER — Other Ambulatory Visit (HOSPITAL_COMMUNITY): Payer: Self-pay | Admitting: Internal Medicine

## 2020-10-30 DIAGNOSIS — R310 Gross hematuria: Secondary | ICD-10-CM | POA: Diagnosis not present

## 2020-10-30 DIAGNOSIS — I4891 Unspecified atrial fibrillation: Secondary | ICD-10-CM | POA: Diagnosis not present

## 2020-10-30 DIAGNOSIS — I10 Essential (primary) hypertension: Secondary | ICD-10-CM | POA: Diagnosis not present

## 2020-10-30 DIAGNOSIS — E6609 Other obesity due to excess calories: Secondary | ICD-10-CM | POA: Diagnosis not present

## 2020-10-30 DIAGNOSIS — Z6830 Body mass index (BMI) 30.0-30.9, adult: Secondary | ICD-10-CM | POA: Diagnosis not present

## 2020-10-30 DIAGNOSIS — J329 Chronic sinusitis, unspecified: Secondary | ICD-10-CM | POA: Diagnosis not present

## 2020-10-30 MED FILL — DOXYCYCLINE HYCLATE 100 MG: 100 | 10 days supply | Qty: 20 | Fill #0

## 2020-11-13 MED FILL — DOXYCYCLINE HYCLATE 100 MG: 100 | 10 days supply | Qty: 20 | Fill #0

## 2020-11-16 ENCOUNTER — Other Ambulatory Visit: Payer: Self-pay | Admitting: Internal Medicine

## 2020-11-16 MED ORDER — DILTIAZEM HCL ER COATED BEADS 180 MG PO CP24: 180.0000 mg | ORAL_CAPSULE | Freq: Every day | ORAL | 3 refills | Status: DC

## 2020-11-16 NOTE — Telephone Encounter (Signed)
*  STAT* If patient is at the pharmacy, call can be transferred to refill team.   1. Which medications need to be refilled? (please list name of each medication and dose if known) diltiazem (CARDIZEM CD) 180 MG 24 hr capsule  2. Which pharmacy/location (including street and city if local pharmacy) is medication to be sent to? [place], [place].  3. Do they need a 30 day or 90 day supply? 30 day  Patient is out of medication

## 2020-11-17 ENCOUNTER — Emergency Department (HOSPITAL_COMMUNITY): Payer: Medicare PPO

## 2020-11-17 ENCOUNTER — Other Ambulatory Visit: Payer: Self-pay

## 2020-11-17 ENCOUNTER — Observation Stay (HOSPITAL_COMMUNITY)
Admission: EM | Admit: 2020-11-17 | Discharge: 2020-11-19 | Disposition: A | Discharge status: Home / Self Care | Payer: Medicare PPO | Attending: Internal Medicine | Admitting: Internal Medicine

## 2020-11-17 ENCOUNTER — Encounter (HOSPITAL_COMMUNITY): Payer: Self-pay | Admitting: Emergency Medicine

## 2020-11-17 DIAGNOSIS — Y9389 Activity, other specified: Secondary | ICD-10-CM | POA: Diagnosis not present

## 2020-11-17 DIAGNOSIS — Z87891 Personal history of nicotine dependence: Secondary | ICD-10-CM | POA: Insufficient documentation

## 2020-11-17 DIAGNOSIS — N179 Acute kidney failure, unspecified: Secondary | ICD-10-CM

## 2020-11-17 DIAGNOSIS — N183 Chronic kidney disease, stage 3 unspecified: Secondary | ICD-10-CM

## 2020-11-17 DIAGNOSIS — R109 Unspecified abdominal pain: Secondary | ICD-10-CM | POA: Diagnosis not present

## 2020-11-17 DIAGNOSIS — I10 Essential (primary) hypertension: Secondary | ICD-10-CM | POA: Diagnosis present

## 2020-11-17 DIAGNOSIS — R55 Syncope and collapse: Secondary | ICD-10-CM | POA: Diagnosis not present

## 2020-11-17 DIAGNOSIS — S199XXA Unspecified injury of neck, initial encounter: Secondary | ICD-10-CM | POA: Diagnosis not present

## 2020-11-17 DIAGNOSIS — I480 Paroxysmal atrial fibrillation: Secondary | ICD-10-CM | POA: Diagnosis present

## 2020-11-17 DIAGNOSIS — I491 Atrial premature depolarization: Secondary | ICD-10-CM | POA: Diagnosis not present

## 2020-11-17 DIAGNOSIS — M2578 Osteophyte, vertebrae: Secondary | ICD-10-CM | POA: Diagnosis not present

## 2020-11-17 DIAGNOSIS — Z7901 Long term (current) use of anticoagulants: Secondary | ICD-10-CM | POA: Insufficient documentation

## 2020-11-17 DIAGNOSIS — R0902 Hypoxemia: Secondary | ICD-10-CM | POA: Diagnosis not present

## 2020-11-17 DIAGNOSIS — R079 Chest pain, unspecified: Secondary | ICD-10-CM | POA: Insufficient documentation

## 2020-11-17 DIAGNOSIS — Y92410 Unspecified street and highway as the place of occurrence of the external cause: Secondary | ICD-10-CM | POA: Insufficient documentation

## 2020-11-17 DIAGNOSIS — J341 Cyst and mucocele of nose and nasal sinus: Secondary | ICD-10-CM | POA: Diagnosis not present

## 2020-11-17 DIAGNOSIS — J3489 Other specified disorders of nose and nasal sinuses: Secondary | ICD-10-CM | POA: Diagnosis not present

## 2020-11-17 DIAGNOSIS — S3991XA Unspecified injury of abdomen, initial encounter: Secondary | ICD-10-CM | POA: Diagnosis not present

## 2020-11-17 DIAGNOSIS — S20213A Contusion of bilateral front wall of thorax, initial encounter: Secondary | ICD-10-CM | POA: Diagnosis not present

## 2020-11-17 DIAGNOSIS — S299XXA Unspecified injury of thorax, initial encounter: Secondary | ICD-10-CM | POA: Diagnosis not present

## 2020-11-17 DIAGNOSIS — S0990XA Unspecified injury of head, initial encounter: Secondary | ICD-10-CM | POA: Diagnosis not present

## 2020-11-17 DIAGNOSIS — Z20822 Contact with and (suspected) exposure to covid-19: Secondary | ICD-10-CM | POA: Insufficient documentation

## 2020-11-17 DIAGNOSIS — S4992XA Unspecified injury of left shoulder and upper arm, initial encounter: Secondary | ICD-10-CM | POA: Diagnosis present

## 2020-11-17 DIAGNOSIS — G4733 Obstructive sleep apnea (adult) (pediatric): Secondary | ICD-10-CM

## 2020-11-17 DIAGNOSIS — Z9989 Dependence on other enabling machines and devices: Secondary | ICD-10-CM

## 2020-11-17 DIAGNOSIS — D696 Thrombocytopenia, unspecified: Secondary | ICD-10-CM | POA: Diagnosis present

## 2020-11-17 DIAGNOSIS — T1490XA Injury, unspecified, initial encounter: Secondary | ICD-10-CM

## 2020-11-17 DIAGNOSIS — S40212A Abrasion of left shoulder, initial encounter: Secondary | ICD-10-CM | POA: Diagnosis not present

## 2020-11-17 DIAGNOSIS — Z79899 Other long term (current) drug therapy: Secondary | ICD-10-CM | POA: Diagnosis not present

## 2020-11-17 DIAGNOSIS — R42 Dizziness and giddiness: Secondary | ICD-10-CM | POA: Diagnosis not present

## 2020-11-17 LAB — URINALYSIS, ROUTINE W REFLEX MICROSCOPIC
Bacteria, UA: NONE SEEN
Bilirubin Urine: NEGATIVE
Glucose, UA: NEGATIVE mg/dL
Ketones, ur: NEGATIVE mg/dL
Leukocytes,Ua: NEGATIVE
Nitrite: NEGATIVE
Protein, ur: 100 mg/dL — AB
RBC / HPF: >50 RBC/hpf — ABNORMAL HIGH (ref 0–5)
Specific Gravity, Urine: 1.021 (ref 1.005–1.030)
pH: 5 (ref 5.0–8.0)

## 2020-11-17 LAB — CBC
HCT: 45.8 % (ref 39.0–52.0)
Hemoglobin: 14.1 g/dL (ref 13.0–17.0)
MCH: 29.7 pg (ref 26.0–34.0)
MCHC: 30.8 g/dL (ref 30.0–36.0)
MCV: 96.6 fL (ref 80.0–100.0)
Platelets: 129 10*3/uL — ABNORMAL LOW (ref 150–400)
RBC: 4.74 MIL/uL (ref 4.22–5.81)
RDW: 14.7 % (ref 11.5–15.5)
WBC: 6.1 10*3/uL (ref 4.0–10.5)
nRBC: 0 % (ref 0.0–0.2)

## 2020-11-17 LAB — BASIC METABOLIC PANEL
Anion gap: 12 (ref 5–15)
BUN: 24 mg/dL — ABNORMAL HIGH (ref 8–23)
CO2: 22 mmol/L (ref 22–32)
Calcium: 10.4 mg/dL — ABNORMAL HIGH (ref 8.9–10.3)
Chloride: 103 mmol/L (ref 98–111)
Creatinine, Ser: 1.34 mg/dL — ABNORMAL HIGH (ref 0.61–1.24)
GFR, Estimated: 52 mL/min — ABNORMAL LOW (ref >60)
Glucose, Bld: 138 mg/dL — ABNORMAL HIGH (ref 70–99)
Potassium: 4 mmol/L (ref 3.5–5.1)
Sodium: 137 mmol/L (ref 135–145)

## 2020-11-17 LAB — PROTIME-INR
INR: 2.2 — ABNORMAL HIGH (ref 0.8–1.2)
Prothrombin Time: 23.8 seconds — ABNORMAL HIGH (ref 11.4–15.2)

## 2020-11-17 LAB — ETHANOL: Alcohol, Ethyl (B): <10 mg/dL (ref <10)

## 2020-11-17 LAB — LACTIC ACID, PLASMA: Lactic Acid, Venous: 1.2 mmol/L (ref 0.5–1.9)

## 2020-11-17 MED ORDER — IOHEXOL 300 MG/ML  SOLN
100.0000 mL | Freq: Once | INTRAMUSCULAR | Status: AC | PRN
  Administered 2020-11-17: 100 mL via INTRAVENOUS

## 2020-11-17 MED ORDER — FLUORESCEIN SODIUM 1 MG OP STRP
1.0000 | ORAL_STRIP | Freq: Once | OPHTHALMIC | Status: AC
  Administered 2020-11-17: 1 via OPHTHALMIC
  Filled 2020-11-17: qty 1

## 2020-11-17 MED ORDER — TETRACAINE HCL 0.5 % OP SOLN
2.0000 [drp] | Freq: Once | OPHTHALMIC | Status: AC
  Administered 2020-11-17: 2 [drp] via OPHTHALMIC
  Filled 2020-11-17: qty 4

## 2020-11-17 MED FILL — CARTIA XT 180 MG CAPSULE SA: 180 | 10 days supply | Qty: 10 | Fill #0

## 2020-11-17 NOTE — ED Notes (Signed)
Patient transported to CT 
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
## 2020-11-18 ENCOUNTER — Encounter (HOSPITAL_COMMUNITY): Payer: Self-pay | Admitting: Internal Medicine

## 2020-11-18 DIAGNOSIS — I493 Ventricular premature depolarization: Secondary | ICD-10-CM | POA: Diagnosis not present

## 2020-11-18 DIAGNOSIS — R55 Syncope and collapse: Secondary | ICD-10-CM

## 2020-11-18 DIAGNOSIS — I480 Paroxysmal atrial fibrillation: Secondary | ICD-10-CM

## 2020-11-18 DIAGNOSIS — D696 Thrombocytopenia, unspecified: Secondary | ICD-10-CM | POA: Diagnosis present

## 2020-11-18 LAB — BASIC METABOLIC PANEL
Anion gap: 10 (ref 5–15)
BUN: 28 mg/dL — ABNORMAL HIGH (ref 8–23)
CO2: 28 mmol/L (ref 22–32)
Calcium: 10.5 mg/dL — ABNORMAL HIGH (ref 8.9–10.3)
Chloride: 102 mmol/L (ref 98–111)
Creatinine, Ser: 1.73 mg/dL — ABNORMAL HIGH (ref 0.61–1.24)
GFR, Estimated: 38 mL/min — ABNORMAL LOW (ref >60)
Glucose, Bld: 139 mg/dL — ABNORMAL HIGH (ref 70–99)
Potassium: 5.4 mmol/L — ABNORMAL HIGH (ref 3.5–5.1)
Sodium: 140 mmol/L (ref 135–145)

## 2020-11-18 LAB — SAMPLE TO BLOOD BANK

## 2020-11-18 LAB — CBC
HCT: 39.5 % (ref 39.0–52.0)
Hemoglobin: 13.1 g/dL (ref 13.0–17.0)
MCH: 30.5 pg (ref 26.0–34.0)
MCHC: 33.2 g/dL (ref 30.0–36.0)
MCV: 92.1 fL (ref 80.0–100.0)
Platelets: 149 10*3/uL — ABNORMAL LOW (ref 150–400)
RBC: 4.29 MIL/uL (ref 4.22–5.81)
RDW: 14.9 % (ref 11.5–15.5)
WBC: 8.7 10*3/uL (ref 4.0–10.5)
nRBC: 0 % (ref 0.0–0.2)

## 2020-11-18 LAB — RESP PANEL BY RT-PCR (FLU A&B, COVID) ARPGX2
Influenza A by PCR: NEGATIVE
Influenza B by PCR: NEGATIVE
SARS Coronavirus 2 by RT PCR: NEGATIVE

## 2020-11-18 LAB — TROPONIN I (HIGH SENSITIVITY): Troponin I (High Sensitivity): 7 ng/L (ref <18)

## 2020-11-18 LAB — CK: Total CK: 191 U/L (ref 49–397)

## 2020-11-18 LAB — D-DIMER, QUANTITATIVE: D-Dimer, Quant: 0.32 ug/mL-FEU (ref 0.00–0.50)

## 2020-11-18 LAB — MAGNESIUM: Magnesium: 1.9 mg/dL (ref 1.7–2.4)

## 2020-11-18 MED ORDER — SODIUM CHLORIDE 0.9 % IV BOLUS
500.0000 mL | Freq: Once | INTRAVENOUS | Status: AC
  Administered 2020-11-18: 500 mL via INTRAVENOUS

## 2020-11-18 MED ORDER — MORPHINE SULFATE (PF) 2 MG/ML IV SOLN
1.0000 mg | Freq: Once | INTRAVENOUS | Status: AC
  Administered 2020-11-18: 1 mg via INTRAVENOUS
  Filled 2020-11-18: qty 1

## 2020-11-18 MED ORDER — MORPHINE SULFATE (PF) 2 MG/ML IV SOLN
1.0000 mg | INTRAVENOUS | Status: DC | PRN
  Administered 2020-11-18 – 2020-11-19 (×2): 1 mg via INTRAVENOUS
  Filled 2020-11-18 (×2): qty 1

## 2020-11-18 MED ORDER — METOPROLOL TARTRATE 50 MG PO TABS
50.0000 mg | ORAL_TABLET | Freq: Two times a day (BID) | ORAL | Status: DC
  Administered 2020-11-18 – 2020-11-19 (×3): 50 mg via ORAL
  Filled 2020-11-18: qty 1
  Filled 2020-11-18: qty 2
  Filled 2020-11-18: qty 1

## 2020-11-18 MED ORDER — DILTIAZEM HCL ER COATED BEADS 180 MG PO CP24
180.0000 mg | ORAL_CAPSULE | Freq: Every day | ORAL | Status: DC
  Administered 2020-11-18 – 2020-11-19 (×2): 180 mg via ORAL
  Filled 2020-11-18 (×3): qty 1

## 2020-11-18 MED ORDER — RIVAROXABAN 15 MG PO TABS
15.0000 mg | ORAL_TABLET | Freq: Every day | ORAL | Status: DC
  Administered 2020-11-19: 15 mg via ORAL
  Filled 2020-11-18: qty 1

## 2020-11-18 MED ORDER — RIVAROXABAN 20 MG PO TABS
20.0000 mg | ORAL_TABLET | Freq: Every day | ORAL | Status: DC
  Administered 2020-11-18: 20 mg via ORAL
  Filled 2020-11-18 (×2): qty 1

## 2020-11-18 MED ORDER — ACETAMINOPHEN 650 MG RE SUPP: 650.0000 mg | Freq: Four times a day (QID) | RECTAL | Status: DC | PRN

## 2020-11-18 MED ORDER — SIMVASTATIN 20 MG PO TABS
20.0000 mg | ORAL_TABLET | Freq: Every day | ORAL | Status: DC
  Administered 2020-11-18: 20 mg via ORAL
  Filled 2020-11-18: qty 1

## 2020-11-18 MED ORDER — SODIUM CHLORIDE 0.9 % IV SOLN: INTRAVENOUS | Status: DC

## 2020-11-18 MED ORDER — TAMSULOSIN HCL 0.4 MG PO CAPS
0.4000 mg | ORAL_CAPSULE | Freq: Every day | ORAL | Status: DC
  Administered 2020-11-18 – 2020-11-19 (×2): 0.4 mg via ORAL
  Filled 2020-11-18 (×2): qty 1

## 2020-11-18 MED ORDER — RIVAROXABAN 20 MG PO TABS: 20.0000 mg | ORAL_TABLET | Freq: Every day | ORAL | Status: DC

## 2020-11-18 MED ORDER — ACETAMINOPHEN 325 MG PO TABS
650.0000 mg | ORAL_TABLET | Freq: Four times a day (QID) | ORAL | Status: DC | PRN
  Administered 2020-11-18: 650 mg via ORAL
  Filled 2020-11-18 (×2): qty 2

## 2020-11-18 MED ORDER — SODIUM ZIRCONIUM CYCLOSILICATE 10 G PO PACK
10.0000 g | PACK | Freq: Once | ORAL | Status: AC
  Administered 2020-11-18: 10 g via ORAL
  Filled 2020-11-18: qty 1

## 2020-11-18 NOTE — ED Notes (Signed)
Pt provided body wash a basin and wash clothes to clean himself up with the bathroom  Daughter also brought toothbrush and deodorant. Hospital socks place on pt feet and disconnected from monitoring   Pt steady on feet, denies feeling lightheaded or dizzy  Upon returning to bed pt continues to deny feeling dizzy and has even non-labored breathing.  Daughter remains at bedside, call light and phone within reach Pt denies pain at this time and denies further needs

## 2020-11-18 NOTE — Plan of Care (Signed)
  Problem: Education: Goal: Knowledge of condition and prescribed therapy will improve Outcome: Progressing   Problem: Cardiac: Goal: Will achieve and/or maintain adequate cardiac output Outcome: Progressing   Problem: Physical Regulation: Goal: Complications related to the disease process, condition or treatment will be avoided or minimized Outcome: Progressing   Problem: Education: Goal: Knowledge of General Education information will improve Description: Including pain rating scale, medication(s)/side effects and non-pharmacologic comfort measures Outcome: Progressing   Problem: Health Behavior/Discharge Planning: Goal: Ability to manage health-related needs will improve Outcome: Progressing
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
## 2020-11-18 NOTE — ED Notes (Signed)
Breakfast Ordered 
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
## 2020-11-19 ENCOUNTER — Observation Stay (HOSPITAL_BASED_OUTPATIENT_CLINIC_OR_DEPARTMENT_OTHER): Payer: Medicare PPO

## 2020-11-19 DIAGNOSIS — I480 Paroxysmal atrial fibrillation: Secondary | ICD-10-CM | POA: Diagnosis not present

## 2020-11-19 DIAGNOSIS — I493 Ventricular premature depolarization: Secondary | ICD-10-CM | POA: Diagnosis not present

## 2020-11-19 DIAGNOSIS — R55 Syncope and collapse: Secondary | ICD-10-CM | POA: Diagnosis not present

## 2020-11-19 DIAGNOSIS — N183 Chronic kidney disease, stage 3 unspecified: Secondary | ICD-10-CM

## 2020-11-19 DIAGNOSIS — N179 Acute kidney failure, unspecified: Secondary | ICD-10-CM

## 2020-11-19 LAB — BASIC METABOLIC PANEL
Anion gap: 7 (ref 5–15)
BUN: 19 mg/dL (ref 8–23)
CO2: 24 mmol/L (ref 22–32)
Calcium: 9.5 mg/dL (ref 8.9–10.3)
Chloride: 109 mmol/L (ref 98–111)
Creatinine, Ser: 1.29 mg/dL — ABNORMAL HIGH (ref 0.61–1.24)
GFR, Estimated: 55 mL/min — ABNORMAL LOW (ref >60)
Glucose, Bld: 99 mg/dL (ref 70–99)
Potassium: 4.1 mmol/L (ref 3.5–5.1)
Sodium: 140 mmol/L (ref 135–145)

## 2020-11-19 LAB — ECHOCARDIOGRAM COMPLETE
Area-P 1/2: 2.8 cm2
Height: 69 in
S' Lateral: 2.6 cm
Weight: 3179.2 oz

## 2020-11-19 MED ORDER — RIVAROXABAN 20 MG PO TABS: 20.0000 mg | ORAL_TABLET | Freq: Every day | ORAL | Status: DC
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
## 2020-11-19 NOTE — Progress Notes (Signed)
  Echocardiogram 2D Echocardiogram has been performed.  Joe Lee 11/19/2020, 3:42 PM
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
## 2020-11-19 NOTE — Discharge Instructions (Addendum)
°  No Driving for 6 months as noted by [facility].  Near-Syncope Near-syncope is when you suddenly get weak or dizzy, or you feel like you might pass out (faint). This may also be called presyncope. This is due to a lack of blood flow to the brain. During an episode of near-syncope, you may:  Feel dizzy, weak, or light-headed.  Feel sick to your stomach (nauseous).  See all white or all black.  See spots.  Have cold, clammy skin. This condition is caused by a sudden decrease in blood flow to the brain. This decrease can result from various causes, but most of those causes are not dangerous. However, near-syncope may be a sign of a serious medical problem, so it is important to seek medical care. Follow these instructions at home: Medicines  Take over-the-counter and prescription medicines only as told by your doctor.  If you are taking blood pressure or heart medicine, get up slowly and spend many minutes getting ready to sit and then stand. This can help with dizziness. General instructions  Be aware of any changes in your symptoms.  Talk with your doctor about your symptoms. You may need to have testing to find the cause of your near-syncope.  If you start to feel like you might pass out, lie down right away. Raise (elevate) your feet above the level of your heart. Breathe deeply and steadily. Wait until all of the symptoms are gone.  Have someone stay with you until you feel stable.  Do not drive, use machinery, or play sports until your doctor says it is okay.  Drink enough fluid to keep your pee (urine) pale yellow.  Keep all follow-up visits as told by your doctor. This is important. Get help right away if you:  Have a seizure.  Have pain in your: ? Chest. ? Belly (abdomen). ? Back.  Faint once or more than once.  Have a very bad headache.  Are bleeding from your mouth or butt.  Have black or tarry poop (stool).  Have a very fast or uneven heartbeat  (palpitations).  Are mixed up (confused).  Have trouble walking.  Are very weak.  Have trouble seeing. These symptoms may be an emergency. Do not wait to see if the symptoms will go away. Get medical help right away. Call your local emergency services (911 in the [place] Do not drive yourself to the hospital. Summary  Near-syncope is when you suddenly get weak or dizzy, or you feel like you might pass out (faint).  This condition is caused by a lack of blood flow to the brain.  Near-syncope may be a sign of a serious medical problem, so it is important to seek medical care. This information is not intended to replace advice given to you by your health care provider. Make sure you discuss any questions you have with your health care provider. Document Revised: 03/26/2019 Document Reviewed: 10/21/2018 Elsevier Patient Education  [place].

## 2020-11-19 NOTE — Care Management Obs Status (Signed)
[place] NOTIFICATION   Patient Details  Name: Joe SAMES Sr. MRN: [id number] Date of Birth: 01-23-1934   Medicare Observation Status Notification Given:  Yes    Bethena Roys, RN 11/19/2020, 9:12 AM

## 2020-11-20 ENCOUNTER — Telehealth: Payer: Self-pay | Admitting: Internal Medicine

## 2020-11-20 DIAGNOSIS — R55 Syncope and collapse: Secondary | ICD-10-CM

## 2020-11-20 NOTE — Telephone Encounter (Signed)
Returned call to patient's granddaughter, Ubaldo Glassing. There is no DPR so I asked her if I could speak to her grandfather. Patient got on the phone and gave verbal permission to speak to granddaughter and asked that she be added to his chart.  I advised granddaughter that I have placed order for 30 day monitor and I verified the mailing address. I have ordered echo which can be done at [facility]. I advised that patient will need a follow-up appointment after tests are complete and that we will call as information becomes available. Alexis verbalized understanding and agreement and thanked me for the call.  I am sending a message to [place], scheduler for Dr. Caryl Comes for follow-up in mid to late January.

## 2020-11-20 NOTE — Telephone Encounter (Signed)
    Pt's granddaughter calling, they got pt's AVS and was instructed to call Dr. Caryl Comes for follow up for outpatient echo and cardiac event monitor placement. There's no order for both, and wanted to clarify if pt needs to see Dr. Caryl Comes for f/u

## 2020-11-21 ENCOUNTER — Telehealth: Payer: Self-pay | Admitting: Radiology

## 2020-11-21 NOTE — Telephone Encounter (Signed)
Enrolled patient for a 30 day [employer] event monitor to be mailed to patients home  

## 2020-11-27 DIAGNOSIS — R55 Syncope and collapse: Secondary | ICD-10-CM | POA: Diagnosis not present

## 2020-11-27 DIAGNOSIS — E860 Dehydration: Secondary | ICD-10-CM | POA: Diagnosis not present

## 2020-11-27 DIAGNOSIS — Z6830 Body mass index (BMI) 30.0-30.9, adult: Secondary | ICD-10-CM | POA: Diagnosis not present

## 2020-11-27 MED FILL — CARTIA XT 120 MG CP24: 120 | 90 days supply | Qty: 90 | Fill #0

## 2020-12-05 ENCOUNTER — Other Ambulatory Visit: Payer: Self-pay

## 2020-12-05 ENCOUNTER — Ambulatory Visit (HOSPITAL_COMMUNITY): Payer: Medicare PPO | Attending: Cardiovascular Disease

## 2020-12-05 DIAGNOSIS — R55 Syncope and collapse: Secondary | ICD-10-CM

## 2020-12-06 ENCOUNTER — Ambulatory Visit: Payer: Medicare PPO | Admitting: Urology

## 2020-12-19 ENCOUNTER — Ambulatory Visit: Payer: Medicare PPO | Admitting: Urology

## 2020-12-21 ENCOUNTER — Encounter: Payer: Self-pay | Admitting: Urology

## 2020-12-21 ENCOUNTER — Ambulatory Visit (INDEPENDENT_AMBULATORY_CARE_PROVIDER_SITE_OTHER): Payer: Medicare PPO | Admitting: Urology

## 2020-12-21 ENCOUNTER — Other Ambulatory Visit: Payer: Self-pay

## 2020-12-21 VITALS — BP 134/76 | HR 64 | Temp 98.6°F | Ht 69.0 in | Wt 208.0 lb

## 2020-12-21 DIAGNOSIS — R310 Gross hematuria: Secondary | ICD-10-CM | POA: Diagnosis not present

## 2020-12-21 NOTE — Progress Notes (Signed)
Urological Symptom Review  Patient is experiencing the following symptoms: Get up at night to urinate   Review of Systems  Gastrointestinal (upper)  : Negative for upper GI symptoms  Gastrointestinal (lower) : Constipation  Constitutional : Night Sweats  Skin: Negative for skin symptoms  Eyes: Negative for eye symptoms  Ear/Nose/Throat : Negative for Ear/Nose/Throat symptoms  Hematologic/Lymphatic: Negative for Hematologic/Lymphatic symptoms  Cardiovascular : Negative for cardiovascular symptoms  Respiratory : Negative for respiratory symptoms  Endocrine: Negative for endocrine symptoms  Musculoskeletal: Negative for musculoskeletal symptoms  Neurological: Negative for neurological symptoms  Psychologic: Negative for psychiatric symptoms

## 2020-12-21 NOTE — Patient Instructions (Signed)

## 2020-12-21 NOTE — Progress Notes (Signed)
12/21/2020 2:03 PM   Joe Bake Sr. Jun 22, 1934 [id number]  Referring provider: Assunta Found, MD [address] [place],  [place] [address]  Gross hematuria  HPI: Joe Lee is a 86yo here for evaluation of gross hematuria. He had intermittent gross hematuria for 3 days in Nov 2021. He had a fall and underwent CT on 11/17/2020 which showed a 15mm left ureteral calculus. He has a remote hx of nephrolithiasis. No flank pain. No recent gross hematuria. He does not know if he passed the calculus. He is on xarelto. He denies any LUTS   PMH: Past Medical History:  Diagnosis Date  . AF (atrial fibrillation) (HCC) 06/06/2010   2D Echo EF>55% normal Echo  . Arthritis   . Chronic constipation   . Chronically elevated hemidiaphragm    elevated left hemidiaphragm  . Colon polyp 1/07   adenomatous  . Diverticulitis   . GERD (gastroesophageal reflux disease)   . Heart murmur   . High cholesterol   . HTN (hypertension)   . Hyperlipidemia   . OSA on CPAP 11/09/2007   sleep study REM AHI was 3.64hr at 7cm  . Sleep apnea   . SOB (shortness of breath) 05/30/2010   stress test normal stress test EF63%    Surgical History: Past Surgical History:  Procedure Laterality Date  . AV FISTULA REPAIR    . CARDIAC CATHETERIZATION  02/06/2007  . CARDIOVERSION N/A 01/01/2018   Procedure: CARDIOVERSION;  Surgeon: Chrystie Nose, MD;  Location: [facility] ENDOSCOPY;  Service: Cardiovascular;  Laterality: N/A;  . CATARACT EXTRACTION W/PHACO  07/28/2012   Procedure: CATARACT EXTRACTION PHACO AND INTRAOCULAR LENS PLACEMENT (IOC);  Surgeon: Loraine Leriche T. Nile Riggs, MD;  Location: [facility] ORS;  Service: Ophthalmology;  Laterality: Right;  CDE=10.80  . CHOLECYSTECTOMY     Dr Katrinka Blazing -[facility]  . COLONOSCOPY N/A 07/23/2015   Procedure: COLONOSCOPY;  Surgeon: Bernette Redbird, MD;  Location: [facility] ENDOSCOPY;  Service: Endoscopy;  Laterality: N/A;  . HEMORRHOID SURGERY    . HERNIA REPAIR    . KNEE SURGERY      right-arthroscopy  . ROTATOR CUFF REPAIR     right    Home Medications:  Allergies as of 12/21/2020      Reactions   Penicillins Hives, Itching, Other (See Comments)   Has patient had a PCN reaction causing immediate rash, facial/tongue/throat swelling, SOB or lightheadedness with hypotension: Yes Has patient had a PCN reaction causing severe rash involving mucus membranes or skin necrosis: No Has patient had a PCN reaction that required hospitalization: Yes Has patient had a PCN reaction occurring within the last 10 years: No If all of the above answers are "NO", then may proceed with Cephalosporin use.   Shellfish Allergy Hives      Medication List       Accurate as of December 21, 2020  2:03 PM. If you have any questions, ask your nurse or doctor.        acetaminophen 325 MG tablet Commonly known as: TYLENOL Take 650 mg by mouth every 6 (six) hours as needed for moderate pain or headache.   Cartia XT 120 MG 24 hr capsule Generic drug: diltiazem Take 120 mg by mouth daily. What changed: Another medication with the same name was removed. Continue taking this medication, and follow the directions you see here. Changed by: Wilkie Aye, MD   CLEAR EYES OP Place 2 drops into the left eye daily as needed (dry eyes).   docusate sodium 100 MG  capsule Commonly known as: COLACE Take 100 mg by mouth daily as needed for mild constipation.   GARLIC PO Take [id number] mg by mouth daily.   metoprolol tartrate 50 MG tablet Commonly known as: LOPRESSOR Take 1 tablet (50 mg total) by mouth 2 (two) times daily.   multivitamin with minerals Tabs tablet Take 1 tablet by mouth daily.   Probiotic Daily Caps Take 1 capsule by mouth daily.   rivaroxaban 20 MG Tabs tablet Commonly known as: Xarelto Take 1 tablet (20 mg total) by mouth daily with supper. LAB WORK NEEDED FOR FURTHER REFILLS   Saw Palmetto 450 MG Caps Take 1 capsule by mouth daily.   simvastatin 20 MG tablet Commonly  known as: ZOCOR TAKE 1 TABLET (20 MG TOTAL) BY MOUTH AT BEDTIME.   tamsulosin 0.4 MG Caps capsule Commonly known as: FLOMAX Take 0.4 mg by mouth daily.       Allergies:  Allergies  Allergen Reactions  . Penicillins Hives, Itching and Other (See Comments)    Has patient had a PCN reaction causing immediate rash, facial/tongue/throat swelling, SOB or lightheadedness with hypotension: Yes Has patient had a PCN reaction causing severe rash involving mucus membranes or skin necrosis: No Has patient had a PCN reaction that required hospitalization: Yes Has patient had a PCN reaction occurring within the last 10 years: No If all of the above answers are "NO", then may proceed with Cephalosporin use.   . Shellfish Allergy Hives    Family History: Family History  Problem Relation Age of Onset  . Hypertension Sister   . Hypertension Brother     Social History:  reports that he quit smoking about 43 years ago. His smoking use included cigarettes. He has a 1.50 pack-year smoking history. He has never used smokeless tobacco. He reports current alcohol use of about 8.0 standard drinks of alcohol per week. He reports that he does not use drugs.  ROS: All other review of systems were reviewed and are negative except what is noted above in HPI  Physical Exam: BP 134/76   Pulse 64   Temp 98.6 F (37 C)   Ht 5\' 9"  (1.753 m)   Wt 208 lb (94.3 kg)   BMI 30.72 kg/m   Constitutional:  Alert and oriented, No acute distress. HEENT: [place] AT, moist mucus membranes.  Trachea midline, no masses. Cardiovascular: No clubbing, cyanosis, or edema. Respiratory: Normal respiratory effort, no increased work of breathing. GI: Abdomen is soft, nontender, nondistended, no abdominal masses GU: No CVA tenderness.  Lymph: No cervical or inguinal lymphadenopathy. Skin: No rashes, bruises or suspicious lesions. Neurologic: Grossly intact, no focal deficits, moving all 4 extremities. Psychiatric: Normal mood  and affect.  Laboratory Data: Lab Results  Component Value Date   WBC 8.7 11/18/2020   HGB 13.1 11/18/2020   HCT 39.5 11/18/2020   MCV 92.1 11/18/2020   PLT 149 (L) 11/18/2020    Lab Results  Component Value Date   CREATININE 1.29 (H) 11/19/2020    No results found for: PSA  No results found for: TESTOSTERONE  Lab Results  Component Value Date   HGBA1C 5.7 (H) 07/23/2015    Urinalysis    Component Value Date/Time   COLORURINE AMBER (A) 11/17/2020 2016   APPEARANCEUR CLOUDY (A) 11/17/2020 2016   LABSPEC 1.021 11/17/2020 2016   PHURINE 5.0 11/17/2020 2016   GLUCOSEU NEGATIVE 11/17/2020 2016   HGBUR MODERATE (A) 11/17/2020 2016   BILIRUBINUR NEGATIVE 11/17/2020 2016   KETONESUR NEGATIVE 11/17/2020  2016   PROTEINUR 100 (A) 11/17/2020 2016   UROBILINOGEN 1.0 05/22/2010 2018   NITRITE NEGATIVE 11/17/2020 2016   LEUKOCYTESUR NEGATIVE 11/17/2020 2016    Lab Results  Component Value Date   BACTERIA NONE SEEN 11/17/2020    Pertinent Imaging: CT 11/17/2020: Images reviewed and discussed with the patient No results found for this or any previous visit.  No results found for this or any previous visit.  No results found for this or any previous visit.  No results found for this or any previous visit.  No results found for this or any previous visit.  No results found for this or any previous visit.  No results found for this or any previous visit.  No results found for this or any previous visit.   Assessment & Plan:    1. Gross hematuria -CT stone study -RTC 2 weeks - Urinalysis, Routine w reflex microscopic   No follow-ups on file.  Nicolette Bang, MD  [facility] [place]

## 2020-12-22 ENCOUNTER — Ambulatory Visit (HOSPITAL_COMMUNITY)
Admission: RE | Admit: 2020-12-22 | Discharge: 2020-12-22 | Disposition: A | Discharge status: Home / Self Care | Payer: Medicare PPO | Source: Ambulatory Visit | Attending: Urology | Admitting: Urology

## 2020-12-22 DIAGNOSIS — R310 Gross hematuria: Secondary | ICD-10-CM | POA: Insufficient documentation

## 2020-12-22 DIAGNOSIS — Z9049 Acquired absence of other specified parts of digestive tract: Secondary | ICD-10-CM | POA: Diagnosis not present

## 2020-12-22 DIAGNOSIS — K7689 Other specified diseases of liver: Secondary | ICD-10-CM | POA: Diagnosis not present

## 2020-12-22 DIAGNOSIS — R319 Hematuria, unspecified: Secondary | ICD-10-CM | POA: Diagnosis not present

## 2020-12-22 DIAGNOSIS — N201 Calculus of ureter: Secondary | ICD-10-CM | POA: Diagnosis not present

## 2021-01-04 ENCOUNTER — Ambulatory Visit (INDEPENDENT_AMBULATORY_CARE_PROVIDER_SITE_OTHER): Payer: Medicare PPO | Admitting: Urology

## 2021-01-04 ENCOUNTER — Other Ambulatory Visit: Payer: Self-pay

## 2021-01-04 VITALS — BP 130/81 | HR 71 | Temp 98.4°F

## 2021-01-04 DIAGNOSIS — N200 Calculus of kidney: Secondary | ICD-10-CM | POA: Diagnosis not present

## 2021-01-04 DIAGNOSIS — R310 Gross hematuria: Secondary | ICD-10-CM

## 2021-01-04 LAB — URINALYSIS, ROUTINE W REFLEX MICROSCOPIC
Bilirubin, UA: NEGATIVE
Glucose, UA: NEGATIVE
Ketones, UA: NEGATIVE
Leukocytes,UA: NEGATIVE
Nitrite, UA: NEGATIVE
Protein,UA: NEGATIVE
RBC, UA: NEGATIVE
Specific Gravity, UA: 1.025 (ref 1.005–1.030)
Urobilinogen, Ur: 0.2 mg/dL (ref 0.2–1.0)
pH, UA: 5.5 (ref 5.0–7.5)

## 2021-01-04 MED ORDER — CIPROFLOXACIN HCL 500 MG PO TABS: 500.0000 mg | ORAL_TABLET | Freq: Once | ORAL | Status: AC

## 2021-01-04 NOTE — Progress Notes (Signed)
Urological Symptom Review  Patient is experiencing the following symptoms: Get up at night to urinate   Review of Systems  Gastrointestinal (upper)  : Negative for upper GI symptoms  Gastrointestinal (lower) : Negative for lower GI symptoms  Constitutional : Night Sweats  Skin: Negative for skin symptoms  Eyes: Negative for eye symptoms  Ear/Nose/Throat : Sinus problems  Hematologic/Lymphatic: Negative for Hematologic/Lymphatic symptoms  Cardiovascular : Negative for cardiovascular symptoms  Respiratory : Negative for respiratory symptoms  Endocrine: Negative for endocrine symptoms  Musculoskeletal: Negative for musculoskeletal symptoms  Neurological: Negative for neurological symptoms  Psychologic: Negative for psychiatric symptoms

## 2021-01-04 NOTE — Progress Notes (Signed)
01/04/2021 11:27 AM   [name] 1934-10-31 [id number]  Referring provider: [name], [address] [place],  [place] [address]  Gross hematuria  HPI: Mr Joe Lee is a 87yo here for followup for gross hematuria. He underwent CT stone study which showed a 56mm left distal ureteral calculus. He has not passed his stone. He had left flank pain yesterday. No worsening LUTS. He has not had gross hematuria in 1 month.   PMH: Past Medical History:  Diagnosis Date  . AF (atrial fibrillation) ([place]) 06/06/2010   2D Echo EF>55% normal Echo  . Arthritis   . Chronic constipation   . Chronically elevated hemidiaphragm    elevated left hemidiaphragm  . Colon polyp 1/07   adenomatous  . Diverticulitis   . GERD (gastroesophageal reflux disease)   . Heart murmur   . High cholesterol   . HTN (hypertension)   . Hyperlipidemia   . OSA on CPAP 11/09/2007   sleep study REM 67mins AHI was 3.64hr at 7cm  . Sleep apnea   . SOB (shortness of breath) 05/30/2010   stress test normal stress test EF63%    Surgical History: Past Surgical History:  Procedure Laterality Date  . AV FISTULA REPAIR    . CARDIAC CATHETERIZATION  02/06/2007  . CARDIOVERSION N/A 01/01/2018   Procedure: CARDIOVERSION;  Surgeon: [name], MD;  Location: [facility] ENDOSCOPY;  Service: Cardiovascular;  Laterality: N/A;  . CATARACT EXTRACTION W/PHACO  07/28/2012   Procedure: CATARACT EXTRACTION PHACO AND INTRAOCULAR LENS PLACEMENT ([place]);  Surgeon: [name], MD;  Location: [facility];  Service: Ophthalmology;  Laterality: Right;  CDE=10.80  . CHOLECYSTECTOMY     Dr [name] -[facility]  . COLONOSCOPY N/A 07/23/2015   Procedure: COLONOSCOPY;  Surgeon: [name], MD;  Location: [facility] ENDOSCOPY;  Service: Endoscopy;  Laterality: N/A;  . HEMORRHOID SURGERY    . HERNIA REPAIR    . KNEE SURGERY     right-arthroscopy  . ROTATOR CUFF REPAIR     right    Home Medications:  Allergies as of 01/04/2021       Reactions   Penicillins Hives, Itching, Other (See Comments)   Has patient had a PCN reaction causing immediate rash, facial/tongue/throat swelling, SOB or lightheadedness with hypotension: Yes Has patient had a PCN reaction causing severe rash involving mucus membranes or skin necrosis: No Has patient had a PCN reaction that required hospitalization: Yes Has patient had a PCN reaction occurring within the last 10 years: No If all of the above answers are "NO", then may proceed with Cephalosporin use.   Shellfish Allergy Hives      Medication List       Accurate as of January 04, 2021 11:27 AM. If you have any questions, ask your nurse or doctor.        acetaminophen 325 MG tablet Commonly known as: TYLENOL Take 650 mg by mouth every 6 (six) hours as needed for moderate pain or headache.   Cartia XT 120 MG 24 hr capsule Generic drug: diltiazem Take 120 mg by mouth daily.   CLEAR EYES OP Place 2 drops into the left eye daily as needed (dry eyes).   docusate sodium 100 MG capsule Commonly known as: COLACE Take 100 mg by mouth daily as needed for mild constipation.   GARLIC PO Take 7,169 mg by mouth daily.   metoprolol tartrate 50 MG tablet Commonly known as: LOPRESSOR Take 1 tablet (50 mg total) by mouth 2 (two) times  daily.   multivitamin with minerals Tabs tablet Take 1 tablet by mouth daily.   Probiotic Daily Caps Take 1 capsule by mouth daily.   rivaroxaban 20 MG Tabs tablet Commonly known as: Xarelto Take 1 tablet (20 mg total) by mouth daily with supper. LAB WORK NEEDED FOR FURTHER REFILLS   Saw Palmetto 450 MG Caps Take 1 capsule by mouth daily.   simvastatin 20 MG tablet Commonly known as: ZOCOR TAKE 1 TABLET (20 MG TOTAL) BY MOUTH AT BEDTIME.   tamsulosin 0.4 MG Caps capsule Commonly known as: FLOMAX Take 0.4 mg by mouth daily.       Allergies:  Allergies  Allergen Reactions  . Penicillins Hives, Itching and Other (See Comments)    Has patient  had a PCN reaction causing immediate rash, facial/tongue/throat swelling, SOB or lightheadedness with hypotension: Yes Has patient had a PCN reaction causing severe rash involving mucus membranes or skin necrosis: No Has patient had a PCN reaction that required hospitalization: Yes Has patient had a PCN reaction occurring within the last 10 years: No If all of the above answers are "NO", then may proceed with Cephalosporin use.   . Shellfish Allergy Hives    Family History: Family History  Problem Relation Age of Onset  . Hypertension Sister   . Hypertension Brother     Social History:  reports that he quit smoking about 43 years ago. His smoking use included cigarettes. He has a 1.50 pack-year smoking history. He has never used smokeless tobacco. He reports current alcohol use of about 8.0 standard drinks of alcohol per week. He reports that he does not use drugs.  ROS: All other review of systems were reviewed and are negative except what is noted above in HPI  Physical Exam: BP 130/81   Pulse 71   Temp 98.4 F (36.9 C)   Constitutional:  Alert and oriented, No acute distress. HEENT: [place] AT, moist mucus membranes.  Trachea midline, no masses. Cardiovascular: No clubbing, cyanosis, or edema. Respiratory: Normal respiratory effort, no increased work of breathing. GI: Abdomen is soft, nontender, nondistended, no abdominal masses GU: No CVA tenderness.  Lymph: No cervical or inguinal lymphadenopathy. Skin: No rashes, bruises or suspicious lesions. Neurologic: Grossly intact, no focal deficits, moving all 4 extremities. Psychiatric: Normal mood and affect.  Laboratory Data: Lab Results  Component Value Date   WBC 8.7 11/18/2020   HGB 13.1 11/18/2020   HCT 39.5 11/18/2020   MCV 92.1 11/18/2020   PLT 149 (L) 11/18/2020    Lab Results  Component Value Date   CREATININE 1.29 (H) 11/19/2020    No results found for: PSA  No results found for: TESTOSTERONE  Lab Results   Component Value Date   HGBA1C 5.7 (H) 07/23/2015    Urinalysis    Component Value Date/Time   COLORURINE AMBER (A) 11/17/2020 2016   APPEARANCEUR CLOUDY (A) 11/17/2020 2016   LABSPEC 1.021 11/17/2020 2016   PHURINE 5.0 11/17/2020 2016   GLUCOSEU NEGATIVE 11/17/2020 2016   HGBUR MODERATE (A) 11/17/2020 2016   [place] NEGATIVE 11/17/2020 2016   [place] NEGATIVE 11/17/2020 2016   PROTEINUR 100 (A) 11/17/2020 2016   UROBILINOGEN 1.0 05/22/2010 2018   NITRITE NEGATIVE 11/17/2020 2016   LEUKOCYTESUR NEGATIVE 11/17/2020 2016    Lab Results  Component Value Date   BACTERIA NONE SEEN 11/17/2020    Pertinent Imaging: CT 12/22/2020: Images reviewed and discussed with the patient  No results found for this or any previous visit.  No  results found for this or any previous visit.  No results found for this or any previous visit.  No results found for this or any previous visit.  No results found for this or any previous visit.  No results found for this or any previous visit.  No results found for this or any previous visit.  Results for orders placed during the hospital encounter of 12/22/20  CT RENAL STONE STUDY  Narrative CLINICAL DATA:  Urinary tract stone. Hematuria. Hematuria 3 weeks prior.  EXAM: CT ABDOMEN AND PELVIS WITHOUT CONTRAST  TECHNIQUE: Multidetector CT imaging of the abdomen and pelvis was performed following the standard protocol without IV contrast.  COMPARISON:  None.  FINDINGS: Lower chest: Band of atelectasis at the LEFT lung base. Elevation LEFT hemidiaphragm  Hepatobiliary: Low-density cyst in LEFT hepatic lobe. Postcholecystectomy. No biliary dilatation.  Pancreas: Pancreas is normal. No ductal dilatation. No pancreatic inflammation.  Spleen: Normal spleen  Adrenals/urinary tract: Adrenal glands normal.  Proximal LEFT ureteral calculus identified on CT 11/17/2020 has migrated to the distal LEFT ureter and measures 3 mm on  image 84/2. The distal LEFT ureter calculus is positioned approximately 3 cm from the LEFT vesicoureteral junction.  No nephrolithiasis.  RIGHT ureter normal.  No bladder calculi.  The  Stomach/Bowel: Stomach, small bowel, appendix, and cecum are normal. Multiple diverticula of the descending colon and sigmoid colon without acute inflammation.  Vascular/Lymphatic: Abdominal aorta is normal caliber. No periportal or retroperitoneal adenopathy. No pelvic adenopathy.  Reproductive: Prostate unremarkable  Other: No free fluid.  Musculoskeletal: No aggressive osseous lesion.  IMPRESSION: 1. Interval migration of LEFT ureteral calculus to distal position 3 cm from the vesicoureteral junction. No LEFT hydroureter or hydronephrosis. 2. No nephrolithiasis.   Electronically Signed By: Suzy Bouchard M.D. On: 12/22/2020 12:26   Assessment & Plan:    1. Gross hematuria -Likely related to distal ureteral calculus  2. Nephrolithiasis -We discussed the management of kidney stones. These options include observation, ureteroscopy, shockwave lithotripsy (ESWL) and percutaneous nephrolithotomy (PCNL). We discussed which options are relevant to the patient's stone(s). We discussed the natural history of kidney stones as well as the complications of untreated stones and the impact on quality of life without treatment as well as with each of the above listed treatments. We also discussed the efficacy of each treatment in its ability to clear the stone burden. With any of these management options I discussed the signs and symptoms of infection and the need for emergent treatment should these be experienced. For each option we discussed the ability of each procedure to clear the patient of their stone burden.   For observation I described the risks which include but are not limited to silent renal damage, life-threatening infection, need for emergent surgery, failure to pass stone and pain.   For  ureteroscopy I described the risks which include bleeding, infection, damage to contiguous structures, positioning injury, ureteral stricture, ureteral avulsion, ureteral injury, need for prolonged ureteral stent, inability to perform ureteroscopy, need for an interval procedure, inability to clear stone burden, stent discomfort/pain, heart attack, stroke, pulmonary embolus and the inherent risks with general anesthesia.   For shockwave lithotripsy I described the risks which include arrhythmia, kidney contusion, kidney hemorrhage, need for transfusion, pain, inability to adequately break up stone, inability to pass stone fragments, Steinstrasse, infection associated with obstructing stones, need for alternate surgical procedure, need for repeat shockwave lithotripsy, MI, CVA, PE and the inherent risks with anesthesia/conscious sedation.   For PCNL I  described the risks including positioning injury, pneumothorax, hydrothorax, need for chest tube, inability to clear stone burden, renal laceration, arterial venous fistula or malformation, need for embolization of kidney, loss of kidney or renal function, need for repeat procedure, need for prolonged nephrostomy tube, ureteral avulsion, MI, CVA, PE and the inherent risks of general anesthesia.   - The patient would like to proceed with medical expulsive therapy. RTC 2 weeks with KUb and renal US    No follow-ups on file.  Nicolette Bang, MD  [facility] [place]

## 2021-01-08 ENCOUNTER — Other Ambulatory Visit: Payer: Medicare PPO | Admitting: Urology

## 2021-01-15 ENCOUNTER — Telehealth: Payer: Self-pay | Admitting: *Deleted

## 2021-01-15 NOTE — Telephone Encounter (Signed)
No answer, no voicemail.

## 2021-01-18 ENCOUNTER — Encounter: Payer: Self-pay | Admitting: Urology

## 2021-01-18 ENCOUNTER — Other Ambulatory Visit: Payer: Self-pay

## 2021-01-18 ENCOUNTER — Ambulatory Visit (HOSPITAL_COMMUNITY)
Admission: RE | Admit: 2021-01-18 | Discharge: 2021-01-18 | Disposition: A | Discharge status: Home / Self Care | Payer: Medicare PPO | Source: Ambulatory Visit | Attending: Urology | Admitting: Urology

## 2021-01-18 ENCOUNTER — Ambulatory Visit (INDEPENDENT_AMBULATORY_CARE_PROVIDER_SITE_OTHER): Payer: Medicare PPO | Admitting: Urology

## 2021-01-18 VITALS — BP 135/82 | HR 60 | Temp 98.3°F | Ht 70.5 in | Wt 210.0 lb

## 2021-01-18 DIAGNOSIS — N200 Calculus of kidney: Secondary | ICD-10-CM | POA: Diagnosis not present

## 2021-01-18 LAB — MICROSCOPIC EXAMINATION
Bacteria, UA: NONE SEEN
Epithelial Cells (non renal): NONE SEEN /hpf (ref 0–10)
Renal Epithel, UA: NONE SEEN /hpf
WBC, UA: NONE SEEN /hpf (ref 0–5)

## 2021-01-18 LAB — URINALYSIS, ROUTINE W REFLEX MICROSCOPIC
Bilirubin, UA: NEGATIVE
Glucose, UA: NEGATIVE
Leukocytes,UA: NEGATIVE
Nitrite, UA: NEGATIVE
Protein,UA: NEGATIVE
Specific Gravity, UA: 1.025 (ref 1.005–1.030)
Urobilinogen, Ur: 0.2 mg/dL (ref 0.2–1.0)
pH, UA: 5 (ref 5.0–7.5)

## 2021-01-18 NOTE — Progress Notes (Signed)
01/18/2021 10:46 AM   Joe Nigh Sr. 12-Dec-1934 [id number]  Referring provider: [name], [place],  [place] [address]  followup nephrolithiasis  HPI: Mr Nellessen is a 87yo here for followup for nephrolithiasis. No recent abdominal imaging. He denies any flank pain. He denies any stone events since last visit. NO worsening LUTS. NO hematuria or dysuria. No new complaints today. He does not know if he passed his calculus   PMH: Past Medical History:  Diagnosis Date  . AF (atrial fibrillation) ([place]) 06/06/2010   2D Echo EF>55% normal Echo  . Arthritis   . Chronic constipation   . Chronically elevated hemidiaphragm    elevated left hemidiaphragm  . Colon polyp 1/07   adenomatous  . Diverticulitis   . GERD (gastroesophageal reflux disease)   . Heart murmur   . High cholesterol   . HTN (hypertension)   . Hyperlipidemia   . OSA on CPAP 11/09/2007   sleep study REM 10mins AHI was 3.64hr at 7cm  . Sleep apnea   . SOB (shortness of breath) 05/30/2010   stress test normal stress test EF63%    Surgical History: Past Surgical History:  Procedure Laterality Date  . AV FISTULA REPAIR    . CARDIAC CATHETERIZATION  02/06/2007  . CARDIOVERSION N/A 01/01/2018   Procedure: CARDIOVERSION;  Surgeon: [name], MD;  Location: [facility] ENDOSCOPY;  Service: Cardiovascular;  Laterality: N/A;  . CATARACT EXTRACTION W/PHACO  07/28/2012   Procedure: CATARACT EXTRACTION PHACO AND INTRAOCULAR LENS PLACEMENT ([place]);  Surgeon: [name], MD;  Location: [facility]S;  Service: Ophthalmology;  Laterality: Right;  CDE=10.80  . CHOLECYSTECTOMY     Dr [name] -[facility]  . COLONOSCOPY N/A 07/23/2015   Procedure: COLONOSCOPY;  Surgeon: [name], MD;  Location: [facility] ENDOSCOPY;  Service: Endoscopy;  Laterality: N/A;  . HEMORRHOID SURGERY    . HERNIA REPAIR    . KNEE SURGERY     right-arthroscopy  . ROTATOR CUFF REPAIR     right    Home Medications:  Allergies as of  01/18/2021      Reactions   Penicillins Hives, Itching, Other (See Comments)   Has patient had a PCN reaction causing immediate rash, facial/tongue/throat swelling, SOB or lightheadedness with hypotension: Yes Has patient had a PCN reaction causing severe rash involving mucus membranes or skin necrosis: No Has patient had a PCN reaction that required hospitalization: Yes Has patient had a PCN reaction occurring within the last 10 years: No If all of the above answers are "NO", then may proceed with Cephalosporin use.   Shellfish Allergy Hives      Medication List       Accurate as of January 18, 2021 10:46 AM. If you have any questions, ask your nurse or doctor.        acetaminophen 325 MG tablet Commonly known as: TYLENOL Take 650 mg by mouth every 6 (six) hours as needed for moderate pain or headache.   Cartia XT 120 MG 24 hr capsule Generic drug: diltiazem Take 120 mg by mouth daily.   CLEAR EYES OP Place 2 drops into the left eye daily as needed (dry eyes).   docusate sodium 100 MG capsule Commonly known as: COLACE Take 100 mg by mouth daily as needed for mild constipation.   GARLIC PO Take [id number] mg by mouth daily.   metoprolol tartrate 50 MG tablet Commonly known as: LOPRESSOR Take 1 tablet (50 mg total) by mouth 2 (two) times daily.  multivitamin with minerals Tabs tablet Take 1 tablet by mouth daily.   Probiotic Daily Caps Take 1 capsule by mouth daily.   rivaroxaban 20 MG Tabs tablet Commonly known as: Xarelto Take 1 tablet (20 mg total) by mouth daily with supper. LAB WORK NEEDED FOR FURTHER REFILLS   Saw Palmetto 450 MG Caps Take 1 capsule by mouth daily.   simvastatin 20 MG tablet Commonly known as: ZOCOR TAKE 1 TABLET (20 MG TOTAL) BY MOUTH AT BEDTIME.   tamsulosin 0.4 MG Caps capsule Commonly known as: FLOMAX Take 0.4 mg by mouth daily.       Allergies:  Allergies  Allergen Reactions  . Penicillins Hives, Itching and Other (See Comments)     Has patient had a PCN reaction causing immediate rash, facial/tongue/throat swelling, SOB or lightheadedness with hypotension: Yes Has patient had a PCN reaction causing severe rash involving mucus membranes or skin necrosis: No Has patient had a PCN reaction that required hospitalization: Yes Has patient had a PCN reaction occurring within the last 10 years: No If all of the above answers are "NO", then may proceed with Cephalosporin use.   . Shellfish Allergy Hives    Family History: Family History  Problem Relation Age of Onset  . Hypertension Sister   . Hypertension Brother     Social History:  reports that he quit smoking about 43 years ago. His smoking use included cigarettes. He has a 1.50 pack-year smoking history. He has never used smokeless tobacco. He reports current alcohol use of about 8.0 standard drinks of alcohol per week. He reports that he does not use drugs.  ROS: All other review of systems were reviewed and are negative except what is noted above in HPI  Physical Exam: BP 135/82   Pulse 60   Temp 98.3 F (36.8 C)   Ht 5' 10.5" (1.791 m)   Wt 210 lb (95.3 kg)   BMI 29.71 kg/m   Constitutional:  Alert and oriented, No acute distress. HEENT: [place] AT, moist mucus membranes.  Trachea midline, no masses. Cardiovascular: No clubbing, cyanosis, or edema. Respiratory: Normal respiratory effort, no increased work of breathing. GI: Abdomen is soft, nontender, nondistended, no abdominal masses GU: No CVA tenderness.  Lymph: No cervical or inguinal lymphadenopathy. Skin: No rashes, bruises or suspicious lesions. Neurologic: Grossly intact, no focal deficits, moving all 4 extremities. Psychiatric: Normal mood and affect.  Laboratory Data: Lab Results  Component Value Date   WBC 8.7 11/18/2020   HGB 13.1 11/18/2020   HCT 39.5 11/18/2020   MCV 92.1 11/18/2020   PLT 149 (L) 11/18/2020    Lab Results  Component Value Date   CREATININE 1.29 (H) 11/19/2020     No results found for: PSA  No results found for: TESTOSTERONE  Lab Results  Component Value Date   HGBA1C 5.7 (H) 07/23/2015    Urinalysis    Component Value Date/Time   COLORURINE AMBER (A) 11/17/2020 2016   APPEARANCEUR Clear 01/04/2021 1133   LABSPEC 1.021 11/17/2020 2016   PHURINE 5.0 11/17/2020 2016   GLUCOSEU Negative 01/04/2021 1133   HGBUR MODERATE (A) 11/17/2020 2016   BILIRUBINUR Negative 01/04/2021 [place] 11/17/2020 2016   PROTEINUR Negative 01/04/2021 1133   PROTEINUR 100 (A) 11/17/2020 2016   UROBILINOGEN 1.0 05/22/2010 2018   NITRITE Negative 01/04/2021 1133   NITRITE NEGATIVE 11/17/2020 2016   LEUKOCYTESUR Negative 01/04/2021 1133   LEUKOCYTESUR NEGATIVE 11/17/2020 2016    Lab Results  Component Value  Date   LABMICR Comment 01/04/2021   BACTERIA NONE SEEN 11/17/2020    Pertinent Imaging:  No results found for this or any previous visit.  No results found for this or any previous visit.  No results found for this or any previous visit.  No results found for this or any previous visit.  No results found for this or any previous visit.  No results found for this or any previous visit.  No results found for this or any previous visit.  Results for orders placed during the hospital encounter of 12/22/20  CT RENAL STONE STUDY  Narrative CLINICAL DATA:  Urinary tract stone. Hematuria. Hematuria 3 weeks prior.  EXAM: CT ABDOMEN AND PELVIS WITHOUT CONTRAST  TECHNIQUE: Multidetector CT imaging of the abdomen and pelvis was performed following the standard protocol without IV contrast.  COMPARISON:  None.  FINDINGS: Lower chest: Band of atelectasis at the LEFT lung base. Elevation LEFT hemidiaphragm  Hepatobiliary: Low-density cyst in LEFT hepatic lobe. Postcholecystectomy. No biliary dilatation.  Pancreas: Pancreas is normal. No ductal dilatation. No pancreatic inflammation.  Spleen: Normal  spleen  Adrenals/urinary tract: Adrenal glands normal.  Proximal LEFT ureteral calculus identified on CT 11/17/2020 has migrated to the distal LEFT ureter and measures 3 mm on image 84/2. The distal LEFT ureter calculus is positioned approximately 3 cm from the LEFT vesicoureteral junction.  No nephrolithiasis.  RIGHT ureter normal.  No bladder calculi.  The  Stomach/Bowel: Stomach, small bowel, appendix, and cecum are normal. Multiple diverticula of the descending colon and sigmoid colon without acute inflammation.  Vascular/Lymphatic: Abdominal aorta is normal caliber. No periportal or retroperitoneal adenopathy. No pelvic adenopathy.  Reproductive: Prostate unremarkable  Other: No free fluid.  Musculoskeletal: No aggressive osseous lesion.  IMPRESSION: 1. Interval migration of LEFT ureteral calculus to distal position 3 cm from the vesicoureteral junction. No LEFT hydroureter or hydronephrosis. 2. No nephrolithiasis.   Electronically Signed By: Suzy Bouchard M.D. On: 12/22/2020 12:26   Assessment & Plan:    1. Nephrolithiasis -KUB and rena [place] today, if it shows he has passed his calculus, I will see him back in 6 months with renal US and KUB   No follow-ups on file.  Nicolette Bang, MD  [facility] [place]

## 2021-01-18 NOTE — Progress Notes (Signed)
Urological Symptom Review  Patient is experiencing the following symptoms: Frequent urination Get up at night to urinate Weak stream   Review of Systems  Gastrointestinal (upper)  : Negative for upper GI symptoms  Gastrointestinal (lower) : Negative for lower GI symptoms  Constitutional : Night Sweats  Skin: Negative for skin symptoms  Eyes: Negative for eye symptoms  Ear/Nose/Throat : Sinus problems  Hematologic/Lymphatic: Negative for Hematologic/Lymphatic symptoms  Cardiovascular : Negative for cardiovascular symptoms  Respiratory : Negative for respiratory symptoms  Endocrine: Negative for endocrine symptoms  Musculoskeletal: Negative for musculoskeletal symptoms  Neurological: Negative for neurological symptoms  Psychologic: Negative for psychiatric symptoms

## 2021-01-18 NOTE — Patient Instructions (Signed)

## 2021-01-19 ENCOUNTER — Encounter: Payer: Self-pay | Admitting: Internal Medicine

## 2021-01-19 ENCOUNTER — Ambulatory Visit: Payer: Medicare PPO | Admitting: Internal Medicine

## 2021-01-19 VITALS — BP 120/68 | HR 63 | Ht 70.5 in | Wt 206.6 lb

## 2021-01-19 DIAGNOSIS — R55 Syncope and collapse: Secondary | ICD-10-CM

## 2021-01-19 DIAGNOSIS — I480 Paroxysmal atrial fibrillation: Secondary | ICD-10-CM | POA: Diagnosis not present

## 2021-01-19 DIAGNOSIS — I4892 Unspecified atrial flutter: Secondary | ICD-10-CM | POA: Diagnosis not present
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
## 2021-01-19 NOTE — Patient Instructions (Signed)
Medication Instructions:  Your physician recommends that you continue on your current medications as directed. Please refer to the Current Medication list given to you today.  *If you need a refill on your cardiac medications before your next appointment, please call your pharmacy*   Lab Work: None ordered.  If you have labs (blood work) drawn today and your tests are completely normal, you will receive your results only by: . MyChart Message (if you have MyChart) OR . A paper copy in the mail If you have any lab test that is abnormal or we need to change your treatment, we will call you to review the results.   Testing/Procedures: None ordered.    Follow-Up: At [facility], you and your health needs are our priority.  As part of our continuing mission to provide you with exceptional heart care, we have created designated Provider Care Teams.  These Care Teams include your primary Cardiologist (physician) and Advanced Practice Providers (APPs -  Physician Assistants and Nurse Practitioners) who all work together to provide you with the care you need, when you need it.  We recommend signing up for the patient portal called "MyChart".  Sign up information is provided on this After Visit Summary.  MyChart is used to connect with patients for Virtual Visits (Telemedicine).  Patients are able to view lab/test results, encounter notes, upcoming appointments, etc.  Non-urgent messages can be sent to your provider as well.   To learn more about what you can do with MyChart, go to [email or web address].    Your next appointment:  You will follow up with Dr Klein as needed.         

## 2021-01-22 NOTE — Progress Notes (Signed)
Results sent via my chart 

## 2021-02-01 ENCOUNTER — Other Ambulatory Visit: Payer: Medicare PPO

## 2021-02-01 DIAGNOSIS — Z20822 Contact with and (suspected) exposure to covid-19: Secondary | ICD-10-CM | POA: Diagnosis not present

## 2021-02-01 DIAGNOSIS — J069 Acute upper respiratory infection, unspecified: Secondary | ICD-10-CM | POA: Diagnosis not present

## 2021-02-02 ENCOUNTER — Other Ambulatory Visit: Payer: Self-pay | Admitting: Internal Medicine

## 2021-02-02 ENCOUNTER — Telehealth: Payer: Self-pay | Admitting: Internal Medicine

## 2021-02-02 LAB — SARS-COV-2, NAA 2 DAY TAT

## 2021-02-02 LAB — NOVEL CORONAVIRUS, NAA: SARS-CoV-2, NAA: NOT DETECTED

## 2021-02-02 MED ORDER — RIVAROXABAN 20 MG PO TABS: 20.0000 mg | ORAL_TABLET | Freq: Every day | ORAL | 1 refills | Status: DC

## 2021-02-02 MED FILL — XARELTO 20 MG TABLET: 20 | 60 days supply | Qty: 60 | Fill #0

## 2021-02-02 NOTE — Telephone Encounter (Signed)
I spoke to the patient and will refill Xarelto 20 mg.  He recently had OV with Dr Caryl Comes and he felt that the Xarelto dose of 20 mg was appropriate.  Prescription refill request for Xarelto received.  Indication: atrial fibrillation Last office visit:2/21 klein Weight:93.7 kg Age:87 Scr:1.2 12/21 CrCl:60.73 ml/min  Prescription refilled

## 2021-02-02 NOTE — Telephone Encounter (Signed)
*  STAT* If patient is at the pharmacy, call can be transferred to refill team.   1. Which medications need to be refilled? (please list name of each medication and dose if known) apixaban (ELIQUIS) 5 MG TABS tablet [[id number]]   2. Which pharmacy/location (including street and city if local pharmacy) is medication to be sent to? [place], [place].  3. Do they need a 30 day or 90 day supply? 90 day supply

## 2021-02-02 NOTE — Telephone Encounter (Deleted)
Prescription refill request for Eliquis received. Indication:atrial fibrillation Last office visit:2/22 klein Scr:1.2 12/21 Age: 87 Weight:93.7 kg  Prescription refilled

## 2021-02-12 MED FILL — CARTIA XT 120 MG CP24: 120 | 90 days supply | Qty: 90 | Fill #0

## 2021-02-21 MED FILL — CARTIA XT 120 MG CP24: 120 | 90 days supply | Qty: 90 | Fill #0

## 2021-04-30 ENCOUNTER — Other Ambulatory Visit: Payer: Self-pay | Admitting: Internal Medicine

## 2021-05-07 ENCOUNTER — Other Ambulatory Visit: Payer: Self-pay | Admitting: *Deleted

## 2021-05-07 ENCOUNTER — Other Ambulatory Visit (HOSPITAL_COMMUNITY): Payer: Self-pay

## 2021-05-07 ENCOUNTER — Other Ambulatory Visit: Payer: Self-pay

## 2021-05-07 ENCOUNTER — Telehealth: Payer: Self-pay | Admitting: Internal Medicine

## 2021-05-07 MED ORDER — DILTIAZEM HCL ER COATED BEADS 120 MG PO CP24: ORAL_CAPSULE | ORAL | 0 refills | Status: DC

## 2021-05-07 MED ORDER — METOPROLOL TARTRATE 50 MG PO TABS
50.0000 mg | ORAL_TABLET | Freq: Two times a day (BID) | ORAL | 0 refills | Status: DC
  Filled 2021-05-07 (×2): qty 10, 5d supply, fill #0

## 2021-05-07 MED ORDER — RIVAROXABAN 20 MG PO TABS
20.0000 mg | ORAL_TABLET | Freq: Every day | ORAL | 0 refills | Status: DC
  Filled 2022-03-14: qty 90, 90d supply, fill #0

## 2021-05-07 NOTE — Telephone Encounter (Signed)
Prescription refill request for Xarelto received.  Indication: PAF Last office visit: 01/19/21 with Dr Caryl Comes Weight: 93.7kg Age: 87 Scr: 1.29 on 11/19/20 CrCl: 55.49  Based on the above findings Xarelto 20mg  once daily is the appropriate dose.  Refill approved.

## 2021-05-07 NOTE — Telephone Encounter (Signed)
Spoke with patient - advised we do not have samples of metoprolol tartrate. Will send 5 day supply to local pharmacy while he waits for mail order to arrive.

## 2021-05-07 NOTE — Telephone Encounter (Signed)
This is Dr. Hilty's pt 

## 2021-05-07 NOTE — Telephone Encounter (Signed)
Pt c/o medication issue:  1. Name of Medication: metoprolol tartrate (LOPRESSOR) 50 MG tablet  2. How are you currently taking this medication (dosage and times per day)? No  3. Are you having a reaction (difficulty breathing--STAT)? no  4. What is your medication issue?PT is calling to find if there can be some samples of this medication be left at the front desk.He states the pharmacy said it would be a few days before he gets the medication in the mail and he has been without it for a while.

## 2021-07-13 ENCOUNTER — Ambulatory Visit (HOSPITAL_COMMUNITY)
Admission: RE | Admit: 2021-07-13 | Discharge: 2021-07-13 | Disposition: A | Discharge status: Home / Self Care | Payer: Medicare PPO | Source: Ambulatory Visit | Attending: Urology | Admitting: Urology

## 2021-07-13 ENCOUNTER — Other Ambulatory Visit: Payer: Self-pay

## 2021-07-13 DIAGNOSIS — N200 Calculus of kidney: Secondary | ICD-10-CM | POA: Diagnosis not present

## 2021-07-18 ENCOUNTER — Ambulatory Visit (INDEPENDENT_AMBULATORY_CARE_PROVIDER_SITE_OTHER): Payer: Medicare PPO | Admitting: Urology

## 2021-07-18 ENCOUNTER — Ambulatory Visit (HOSPITAL_COMMUNITY)
Admission: RE | Admit: 2021-07-18 | Discharge: 2021-07-18 | Disposition: A | Discharge status: Home / Self Care | Payer: Medicare PPO | Source: Ambulatory Visit | Attending: Urology | Admitting: Urology

## 2021-07-18 ENCOUNTER — Other Ambulatory Visit: Payer: Self-pay

## 2021-07-18 VITALS — BP 151/75 | HR 73

## 2021-07-18 DIAGNOSIS — N200 Calculus of kidney: Secondary | ICD-10-CM | POA: Diagnosis not present

## 2021-07-18 DIAGNOSIS — N201 Calculus of ureter: Secondary | ICD-10-CM | POA: Diagnosis not present

## 2021-07-18 DIAGNOSIS — M47816 Spondylosis without myelopathy or radiculopathy, lumbar region: Secondary | ICD-10-CM | POA: Diagnosis not present

## 2021-07-18 LAB — URINALYSIS, ROUTINE W REFLEX MICROSCOPIC
Bilirubin, UA: NEGATIVE
Glucose, UA: NEGATIVE
Ketones, UA: NEGATIVE
Leukocytes,UA: NEGATIVE
Nitrite, UA: NEGATIVE
Protein,UA: NEGATIVE
Specific Gravity, UA: 1.02 (ref 1.005–1.030)
Urobilinogen, Ur: 2 mg/dL — ABNORMAL HIGH (ref 0.2–1.0)
pH, UA: 7 (ref 5.0–7.5)

## 2021-07-18 LAB — MICROSCOPIC EXAMINATION
Bacteria, UA: NONE SEEN
Epithelial Cells (non renal): NONE SEEN /hpf (ref 0–10)
Renal Epithel, UA: NONE SEEN /hpf
WBC, UA: NONE SEEN /hpf (ref 0–5)

## 2021-07-18 MED ORDER — TAMSULOSIN HCL 0.4 MG PO CAPS
0.4000 mg | ORAL_CAPSULE | Freq: Every day | ORAL | 11 refills | Status: DC
  Filled 2022-03-14 – 2022-05-20 (×2): qty 30, 30d supply, fill #0
  Filled 2022-06-24: qty 30, 30d supply, fill #1

## 2021-07-18 NOTE — Progress Notes (Signed)
07/18/2021 12:08 PM   Joe Nigh Sr. [birth year] [id number]  Referring provider: [name], MD [address] [place],  [place] [address]  Followup nephrolithiasis   HPI: Joe Lee is a 87yo here for followup for nephrolithiasis. No stone events since last visit. He denies any flank pain. He notes his urine stream and nocturia improved on flomax 0.'4mg'$  daily. Renal US and KUB shows no calculi. No other complaints today.    PMH: Past Medical History:  Diagnosis Date   AF (atrial fibrillation) ([place] [place]) 06/06/2010   2D Echo EF>55% normal Echo   Arthritis    Chronic constipation    Chronically elevated hemidiaphragm    elevated left hemidiaphragm   Colon polyp 1/07   adenomatous   Diverticulitis    GERD (gastroesophageal reflux disease)    Heart murmur    High cholesterol    HTN (hypertension)    Hyperlipidemia    OSA on CPAP 11/09/2007   sleep study REM 42mns AHI was 3.64hr at 7cm   Sleep apnea    SOB (shortness of breath) 05/30/2010   stress test normal stress test EF63%    Surgical History: Past Surgical History:  Procedure Laterality Date   AV FISTULA REPAIR     CARDIAC CATHETERIZATION  02/06/2007   CARDIOVERSION N/A 01/01/2018   Procedure: CARDIOVERSION;  Surgeon: HPixie Casino MD;  [facility];  Service: Cardiovascular;  Laterality: N/A;   CATARACT EXTRACTION W/PHACO  07/28/2012   Procedure: CATARACT EXTRA[place]  Surgeon: MElta GuadeloupeT. SGershon Crane MD;  Location: [facility];  Service: Ophthalmology;  Laterality: Right;  CDE=10.80   CHOLECYSTECTOMY     Dr STamala Julian-[facility]   COLONOSCOPY N/A 07/23/2015   Procedure: COLONOSCOPY;  Surgeon: RRonald Lobo MD;  [facility];  Service: Endoscopy;  Laterality: N/A;   HEMORRHOID SURGERY     HERNIA REPAIR     KNEE SURGERY     right-arthroscopy   ROTATOR CUFF REPAIR     right    Home Medications:  Allergies as of 07/18/2021       Reactions   Penicillins  Hives, Itching, Other (See Comments)   Has patient had a PCN reaction causing immediate rash, facial/tongue/throat swelling, SOB or lightheadedness with hypotension: Yes Has patient had a PCN reaction causing severe rash involving mucus membranes or skin necrosis: No Has patient had a PCN reaction that required hospitalization: Yes Has patient had a PCN reaction occurring within the last 10 years: No If all of the above answers are "NO", then may proceed with Cephalosporin use.   Shellfish Allergy Hives        Medication List        Accurate as of July 18, 2021 12:08 PM. If you have any questions, ask your nurse or doctor.          acetaminophen 325 MG tablet Commonly known as: TYLENOL Take 650 mg by mouth every 6 (six) hours as needed for moderate pain or headache.   CLEAR EYES OP Place 2 drops into the left eye daily as needed (dry eyes).   diltiazem 120 MG 24 hr capsule Commonly known as: CARDIZEM CD TAKE 1 CAPSULE (120 MG) BY MOUTH ONCE DAILY   docusate sodium 100 MG capsule Commonly known as: COLACE Take 100 mg by mouth daily as needed for mild constipation.   doxycycline 100 MG capsule Commonly known as: VIBRAMYCIN TAKE 1 CAPSULE BY MOUTH TWO TIMES DAILY   GARLIC PO Take [id number]  mg by mouth daily.   metoprolol tartrate 50 MG tablet Commonly known as: LOPRESSOR Take 1 tablet (50 mg total) by mouth 2 (two) times daily.   multivitamin with minerals Tabs tablet Take 1 tablet by mouth daily.   Probiotic Daily Caps Take 1 capsule by mouth daily.   rivaroxaban 20 MG Tabs tablet Commonly known as: XARELTO TAKE 1 TABLET (20 MG TOTAL) BY MOUTH DAILY WITH SUPPER. LAB WORK NEEDED FOR FURTHER REFILLS   Saw Palmetto 450 MG Caps Take 1 capsule by mouth daily.   simvastatin 20 MG tablet Commonly known as: ZOCOR TAKE 1 TABLET AT BEDTIME   tamsulosin 0.4 MG Caps capsule Commonly known as: FLOMAX Take 0.4 mg by mouth daily.        Allergies:  Allergies   Allergen Reactions   Penicillins Hives, Itching and Other (See Comments)    Has patient had a PCN reaction causing immediate rash, facial/tongue/throat swelling, SOB or lightheadedness with hypotension: Yes Has patient had a PCN reaction causing severe rash involving mucus membranes or skin necrosis: No Has patient had a PCN reaction that required hospitalization: Yes Has patient had a PCN reaction occurring within the last 10 years: No If all of the above answers are "NO", then may proceed with Cephalosporin use.    Shellfish Allergy Hives    Family History: Family History  Problem Relation Age of Onset   Hypertension Sister    Hypertension Brother     Social History:  reports that he quit smoking about 44 years ago. His smoking use included cigarettes. He has a 1.50 pack-year smoking history. He has never used smokeless tobacco. He reports current alcohol use of about 8.0 standard drinks of alcohol per week. He reports that he does not use drugs.  ROS: All other review of systems were reviewed and are negative except what is noted above in HPI  Physical Exam: BP (!) 151/75   Pulse 73   Constitutional:  Alert and oriented, No acute distress. HEENT: Ama AT, moist mucus membranes.  Trachea midline, no masses. Cardiovascular: No clubbing, cyanosis, or edema. Respiratory: Normal respiratory effort, no increased work of breathing. GI: Abdomen is soft, nontender, nondistended, no abdominal masses GU: No CVA tenderness.  Lymph: No cervical or inguinal lymphadenopathy. Skin: No rashes, bruises or suspicious lesions. Neurologic: Grossly intact, no focal deficits, moving all 4 extremities. Psychiatric: Normal mood and affect.  Laboratory Data: Lab Results  Component Value Date   WBC 8.7 11/18/2020   HGB 13.1 11/18/2020   HCT 39.5 11/18/2020   MCV 92.1 11/18/2020   PLT 149 (L) 11/18/2020    Lab Results  Component Value Date   CREATININE 1.29 (H) 11/19/2020    No results  found for: PSA  No results found for: TESTOSTERONE  Lab Results  Component Value Date   HGBA1C 5.7 (H) 07/23/2015    Urinalysis    Component Value Date/Time   COLORURINE AMBER (A) 11/17/2020 2016   APPEARANCEUR Clear 01/18/2021 1342   LABSPEC 1.021 11/17/2020 2016   PHURINE 5.0 11/17/2020 2016   GLUCOSEU Negative 01/18/2021 1342   HGBUR MODERATE (A) 11/17/2020 2016   BILIRUBINUR Negative 01/18/2021 [place] 11/17/2020 2016   PROTEINUR Negative 01/18/2021 1342   PROTEINUR 100 (A) 11/17/2020 2016   UROBILINOGEN 1.0 05/22/2010 2018   NITRITE Negative 01/18/2021 1342   NITRITE NEGATIVE 11/17/2020 2016   LEUKOCYTESUR Negative 01/18/2021 1342   LEUKOCYTESUR NEGATIVE 11/17/2020 2016    Lab Results  Component Value  Date   LABMICR See below: 01/18/2021   WBCUA None seen 01/18/2021   LABEPIT None seen 01/18/2021   BACTERIA None seen 01/18/2021    Pertinent Imaging: Renal US 8/2 and KUB: Images reviewed and discussed with the patient Results for orders placed during the hospital encounter of 01/18/21  Abdomen 1 view (KUB)  Narrative CLINICAL DATA:  Nephrolithiasis.  Hematuria.  EXAM: ABDOMEN - 1 VIEW  COMPARISON:  CT scan 12/22/2020  FINDINGS: Lumbar spondylosis and degenerative disc disease. Cholecystectomy clips noted.  No well-defined calculi are identified. Speckled density over the lower pelvis noted but may well represent stool in the rectosigmoid.  IMPRESSION: 1. No well-defined calculi are identified. 2. Lumbar spondylosis and degenerative disc disease.   Electronically Signed By: Van Clines M.D. On: 01/18/2021 16:41  No results found for this or any previous visit.  No results found for this or any previous visit.  No results found for this or any previous visit.  Results for orders placed during the hospital encounter of 07/13/21  Ultrasound renal complete  Narrative CLINICAL DATA:  Nephrolithiasis  follow-up  EXAM: RENAL / URINARY TRACT ULTRASOUND COMPLETE  COMPARISON:  Renal stone protocol CT 12/22/2020  FINDINGS: Right Kidney:  Renal measurements: 9.7 x 4.6 x 5.5 cm = volume: 128 mL. Echogenicity within normal limits. No mass or hydronephrosis visualized.  Left Kidney:  Renal measurements: 10.7 x 5.3 x 6.2 = volume: 183 mL. Echogenicity within normal limits. No mass or hydronephrosis visualized.  Bladder:  Limited visualization due to under distension and shadowing bowel gas.  Other:  None.  IMPRESSION: No significant sonographic abnormality of the kidneys.   Electronically Signed By: Miachel Roux M.D. On: 07/16/2021 08:22  No results found for this or any previous visit.  No results found for this or any previous visit.  Results for orders placed during the hospital encounter of 12/22/20  CT RENAL STONE STUDY  Narrative CLINICAL DATA:  Urinary tract stone. Hematuria. Hematuria 3 weeks prior.  EXAM: CT ABDOMEN AND PELVIS WITHOUT CONTRAST  TECHNIQUE: Multidetector CT imaging of the abdomen and pelvis was performed following the standard protocol without IV contrast.  COMPARISON:  None.  FINDINGS: Lower chest: Band of atelectasis at the LEFT lung base. Elevation LEFT hemidiaphragm  Hepatobiliary: Low-density cyst in LEFT hepatic lobe. Postcholecystectomy. No biliary dilatation.  Pancreas: Pancreas is normal. No ductal dilatation. No pancreatic inflammation.  Spleen: Normal spleen  Adrenals/urinary tract: Adrenal glands normal.  Proximal LEFT ureteral calculus identified on CT 11/17/2020 has migrated to the distal LEFT ureter and measures 3 mm on image 84/2. The distal LEFT ureter calculus is positioned approximately 3 cm from the LEFT vesicoureteral junction.  No nephrolithiasis.  RIGHT ureter normal.  No bladder calculi.  The  Stomach/Bowel: Stomach, small bowel, appendix, and cecum are normal. Multiple diverticula of the  descending colon and sigmoid colon without acute inflammation.  Vascular/Lymphatic: Abdominal aorta is normal caliber. No periportal or retroperitoneal adenopathy. No pelvic adenopathy.  Reproductive: Prostate unremarkable  Other: No free fluid.  Musculoskeletal: No aggressive osseous lesion.  IMPRESSION: 1. Interval migration of LEFT ureteral calculus to distal position 3 cm from the vesicoureteral junction. No LEFT hydroureter or hydronephrosis. 2. No nephrolithiasis.   Electronically Signed By: Suzy Bouchard M.D. On: 12/22/2020 12:26   Assessment & Plan:    1. Nephrolithiasis -RTC 1 year with a renal US - Urinalysis, Routine w reflex microscopic   No follow-ups on file.  Nicolette Bang, MD  [facility]  [place]

## 2021-07-18 NOTE — Progress Notes (Signed)
Urological Symptom Review  Patient is experiencing the following symptoms: none   Review of Systems  Gastrointestinal (upper)  : Negative for upper GI symptoms  Gastrointestinal (lower) : Negative for lower GI symptoms  Constitutional : Negative for symptoms  Skin: Negative for skin symptoms  Eyes: Negative for eye symptoms  Ear/Nose/Throat : Sinus problems  Hematologic/Lymphatic: Negative for Hematologic/Lymphatic symptoms  Cardiovascular : Negative for cardiovascular symptoms  Respiratory : Negative for respiratory symptoms  Endocrine: Negative for endocrine symptoms  Musculoskeletal: Negative for musculoskeletal symptoms  Neurological: Negative for neurological symptoms  Psychologic: Negative for psychiatric symptoms

## 2021-07-19 DIAGNOSIS — J069 Acute upper respiratory infection, unspecified: Secondary | ICD-10-CM | POA: Diagnosis not present

## 2021-07-19 DIAGNOSIS — Z20828 Contact with and (suspected) exposure to other viral communicable diseases: Secondary | ICD-10-CM | POA: Diagnosis not present

## 2021-08-12 ENCOUNTER — Encounter: Payer: Self-pay | Admitting: Urology

## 2021-08-12 NOTE — Patient Instructions (Signed)
Textbook of Natural Medicine (5th ed., pp. 1518-1527.e3). [place], [place]: Elsevier.">  Dietary Guidelines to Help Prevent Kidney Stones Kidney stones are deposits of minerals and salts that form inside your kidneys. Your risk of developing kidney stones may be greater depending on your diet, your lifestyle, the medicines you take, and whether you have certain medical conditions. Most people can lower their chances of developing kidney stones by following the instructions below. Your dietitian may give you more specific instructions depending on your overall health and the type of kidney stones youtend to develop. What are tips for following this plan? Reading food labels  Choose foods with "no salt added" or "low-salt" labels. Limit your salt (sodium) intake to less than 1,500 mg a day. Choose foods with calcium for each meal and snack. Try to eat about 300 mg of calcium at each meal. Foods that contain 200-500 mg of calcium a serving include: 8 oz (237 mL) of milk, calcium-fortifiednon-dairy milk, and calcium-fortifiedfruit juice. Calcium-fortified means that calcium has been added to these drinks. 8 oz (237 mL) of kefir, yogurt, and soy yogurt. 4 oz (114 g) of tofu. 1 oz (28 g) of cheese. 1 cup (150 g) of dried figs. 1 cup (91 g) of cooked broccoli. One 3 oz (85 g) can of sardines or mackerel. Most people need 1,000-1,500 mg of calcium a day. Talk to your dietitian abouthow much calcium is recommended for you. Shopping Buy plenty of fresh fruits and vegetables. Most people do not need to avoid fruits and vegetables, even if these foods contain nutrients that may contribute to kidney stones. When shopping for convenience foods, choose: Whole pieces of fruit. Pre-made salads with dressing on the side. Low-fat fruit and yogurt smoothies. Avoid buying frozen meals or prepared deli foods. These can be high in sodium. Look for foods with live cultures, such as yogurt and kefir. Choose high-fiber  grains, such as whole-wheat breads, oat bran, and wheat cereals. Cooking Do not add salt to food when cooking. Place a salt shaker on the table and allow each person to add his or her own salt to taste. Use vegetable protein, such as beans, textured vegetable protein (TVP), or tofu, instead of meat in pasta, casseroles, and soups. Meal planning Eat less salt, if told by your dietitian. To do this: Avoid eating processed or pre-made food. Avoid eating fast food. Eat less animal protein, including cheese, meat, poultry, or fish, if told by your dietitian. To do this: Limit the number of times you have meat, poultry, fish, or cheese each week. Eat a diet free of meat at least 2 days a week. Eat only one serving each day of meat, poultry, fish, or seafood. When you prepare animal protein, cut pieces into small portion sizes. For most meat and fish, one serving is about the size of the palm of your hand. Eat at least five servings of fresh fruits and vegetables each day. To do this: Keep fruits and vegetables on hand for snacks. Eat one piece of fruit or a handful of berries with breakfast. Have a salad and fruit at lunch. Have two kinds of vegetables at dinner. Limit foods that are high in a substance called oxalate. These include: Spinach (cooked), rhubarb, beets, sweet potatoes, and Swiss chard. Peanuts. Potato chips, french fries, and baked potatoes with skin on. Nuts and nut products. Chocolate. If you regularly take a diuretic medicine, make sure to eat at least 1 or 2 servings of fruits or vegetables that are   high in potassium each day. These include: Avocado. Banana. Orange, prune, carrot, or tomato juice. Baked potato. Cabbage. Beans and split peas. Lifestyle  Drink enough fluid to keep your urine pale yellow. This is the most important thing you can do. Spread your fluid intake throughout the day. If you drink alcohol: Limit how much you use to: 0-1 drink a day for women who  are not pregnant. 0-2 drinks a day for men. Be aware of how much alcohol is in your drink. In the [place] one drink equals one 12 oz bottle of beer (355 mL), one 5 oz glass of wine (148 mL), or one 1 oz glass of hard liquor (44 mL). Lose weight if told by your health care provider. Work with your dietitian to find an eating plan and weight loss strategies that work best for you.  General information Talk to your health care provider and dietitian about taking daily supplements. You may be told the following depending on your health and the cause of your kidney stones: Not to take supplements with vitamin C. To take a calcium supplement. To take a daily probiotic supplement. To take other supplements such as magnesium, fish oil, or vitamin B6. Take over-the-counter and prescription medicines only as told by your health care provider. These include supplements. What foods should I limit? Limit your intake of the following foods, or eat them as told by your dietitian. Vegetables Spinach. Rhubarb. Beets. Canned vegetables. Pickles. Olives. Baked potatoeswith skin. Grains Wheat bran. Baked goods. Salted crackers. Cereals high in sugar. Meats and other proteins Nuts. Nut butters. Large portions of meat, poultry, or fish. Salted, precooked,or cured meats, such as sausages, meat loaves, and hot dogs. Dairy Cheese. Beverages Regular soft drinks. Regular vegetable juice. Seasonings and condiments Seasoning blends with salt. Salad dressings. Soy sauce. Ketchup. Barbecue sauce. Other foods Canned soups. Canned pasta sauce. Casseroles. Pizza. Lasagna. Frozen meals.Potato chips. French fries. The items listed above may not be a complete list of foods and beverages you should limit. Contact a dietitian for more information. What foods should I avoid? Talk to your dietitian about specific foods you should avoid based on the typeof kidney stones you have and your overall health. Fruits Grapefruit. The  item listed above may not be a complete list of foods and beverages you should avoid. Contact a dietitian for more information. Summary Kidney stones are deposits of minerals and salts that form inside your kidneys. You can lower your risk of kidney stones by making changes to your diet. The most important thing you can do is drink enough fluid. Drink enough fluid to keep your urine pale yellow. Talk to your dietitian about how much calcium you should have each day, and eat less salt and animal protein as told by your dietitian. This information is not intended to replace advice given to you by your health care provider. Make sure you discuss any questions you have with your healthcare provider. Document Revised: 11/25/2019 Document Reviewed: 11/25/2019 Elsevier Patient Education  2022 [employer] Inc.  

## 2021-08-14 NOTE — Progress Notes (Signed)
Results printed and mailed.   

## 2021-08-21 DIAGNOSIS — Z23 Encounter for immunization: Secondary | ICD-10-CM | POA: Diagnosis not present

## 2021-11-09 ENCOUNTER — Other Ambulatory Visit: Payer: Self-pay | Admitting: Internal Medicine

## 2021-11-09 ENCOUNTER — Other Ambulatory Visit (HOSPITAL_COMMUNITY): Payer: Self-pay

## 2021-11-09 MED FILL — Simvastatin Tab 20 MG: ORAL | 90 days supply | Qty: 90 | Fill #0 | Status: AC

## 2021-11-22 DIAGNOSIS — E7849 Other hyperlipidemia: Secondary | ICD-10-CM | POA: Diagnosis not present

## 2021-11-22 DIAGNOSIS — E782 Mixed hyperlipidemia: Secondary | ICD-10-CM | POA: Diagnosis not present

## 2021-11-22 DIAGNOSIS — E6609 Other obesity due to excess calories: Secondary | ICD-10-CM | POA: Diagnosis not present

## 2021-11-22 DIAGNOSIS — M549 Dorsalgia, unspecified: Secondary | ICD-10-CM | POA: Diagnosis not present

## 2021-11-22 DIAGNOSIS — Z0000 Encounter for general adult medical examination without abnormal findings: Secondary | ICD-10-CM | POA: Diagnosis not present

## 2021-11-22 DIAGNOSIS — I4891 Unspecified atrial fibrillation: Secondary | ICD-10-CM | POA: Diagnosis not present

## 2021-11-22 DIAGNOSIS — D689 Coagulation defect, unspecified: Secondary | ICD-10-CM | POA: Diagnosis not present

## 2021-11-22 DIAGNOSIS — I10 Essential (primary) hypertension: Secondary | ICD-10-CM | POA: Diagnosis not present

## 2021-11-22 DIAGNOSIS — Z1331 Encounter for screening for depression: Secondary | ICD-10-CM | POA: Diagnosis not present

## 2021-11-22 DIAGNOSIS — G4733 Obstructive sleep apnea (adult) (pediatric): Secondary | ICD-10-CM | POA: Diagnosis not present

## 2021-11-22 DIAGNOSIS — Z6828 Body mass index (BMI) 28.0-28.9, adult: Secondary | ICD-10-CM | POA: Diagnosis not present

## 2022-02-12 ENCOUNTER — Other Ambulatory Visit (HOSPITAL_COMMUNITY): Payer: Self-pay

## 2022-02-12 ENCOUNTER — Other Ambulatory Visit: Payer: Self-pay | Admitting: *Deleted

## 2022-02-12 MED ORDER — SIMVASTATIN 20 MG PO TABS
20.0000 mg | ORAL_TABLET | Freq: Every evening | ORAL | 0 refills | Status: DC
  Filled 2022-02-12: qty 30, 30d supply, fill #0

## 2022-02-12 MED FILL — Simvastatin Tab 20 MG: ORAL | 90 days supply | Qty: 90 | Fill #1 | Status: CN

## 2022-02-13 ENCOUNTER — Other Ambulatory Visit (HOSPITAL_COMMUNITY): Payer: Self-pay

## 2022-03-06 ENCOUNTER — Other Ambulatory Visit (HOSPITAL_COMMUNITY): Payer: Self-pay

## 2022-03-06 ENCOUNTER — Other Ambulatory Visit: Payer: Self-pay | Admitting: Internal Medicine

## 2022-03-06 MED ORDER — SIMVASTATIN 20 MG PO TABS
20.0000 mg | ORAL_TABLET | Freq: Every evening | ORAL | 0 refills | Status: DC
  Filled 2022-03-06: qty 15, 15d supply, fill #0

## 2022-03-07 ENCOUNTER — Other Ambulatory Visit (HOSPITAL_COMMUNITY): Payer: Self-pay

## 2022-03-11 ENCOUNTER — Other Ambulatory Visit: Payer: Self-pay

## 2022-03-11 ENCOUNTER — Other Ambulatory Visit (HOSPITAL_COMMUNITY): Payer: Self-pay

## 2022-03-11 ENCOUNTER — Telehealth: Payer: Self-pay | Admitting: Internal Medicine

## 2022-03-11 MED ORDER — SIMVASTATIN 20 MG PO TABS
20.0000 mg | ORAL_TABLET | Freq: Every evening | ORAL | 0 refills | Status: DC
  Filled 2022-03-11: qty 15, 15d supply, fill #0

## 2022-03-11 NOTE — Telephone Encounter (Signed)
?*  STAT* If patient is at the pharmacy, call can be transferred to refill team. ? ? ?1. Which medications need to be refilled? (please list name of each medication and dose if known) simvastatin (ZOCOR) 20 MG tablet ? ?2. Which pharmacy/location (including street and city if local pharmacy) is medication to be sent to? [facility] ? ?3. Do they need a 30 day or 90 day supply? 90 ? ?

## 2022-03-14 ENCOUNTER — Encounter: Payer: Self-pay | Admitting: Internal Medicine

## 2022-03-14 ENCOUNTER — Other Ambulatory Visit (HOSPITAL_COMMUNITY): Payer: Self-pay

## 2022-03-14 ENCOUNTER — Ambulatory Visit: Payer: Medicare PPO | Admitting: Internal Medicine

## 2022-03-14 VITALS — BP 130/76 | HR 61 | Ht 72.0 in | Wt 210.0 lb

## 2022-03-14 DIAGNOSIS — I4892 Unspecified atrial flutter: Secondary | ICD-10-CM

## 2022-03-14 DIAGNOSIS — I10 Essential (primary) hypertension: Secondary | ICD-10-CM | POA: Diagnosis not present

## 2022-03-14 DIAGNOSIS — E782 Mixed hyperlipidemia: Secondary | ICD-10-CM | POA: Diagnosis not present

## 2022-03-14 DIAGNOSIS — Z9989 Dependence on other enabling machines and devices: Secondary | ICD-10-CM | POA: Diagnosis not present

## 2022-03-14 DIAGNOSIS — I480 Paroxysmal atrial fibrillation: Secondary | ICD-10-CM

## 2022-03-14 DIAGNOSIS — G4733 Obstructive sleep apnea (adult) (pediatric): Secondary | ICD-10-CM

## 2022-03-14 DIAGNOSIS — Z7901 Long term (current) use of anticoagulants: Secondary | ICD-10-CM | POA: Diagnosis not present

## 2022-03-14 MED ORDER — DILTIAZEM HCL ER COATED BEADS 120 MG PO CP24
120.0000 mg | ORAL_CAPSULE | Freq: Every day | ORAL | 3 refills | Status: DC
  Filled 2022-03-14 – 2022-05-20 (×2): qty 90, 90d supply, fill #0
  Filled 2022-08-14: qty 90, 90d supply, fill #1
  Filled 2022-11-15: qty 90, 90d supply, fill #2
  Filled 2023-02-12: qty 90, 90d supply, fill #3

## 2022-03-14 MED ORDER — METOPROLOL TARTRATE 50 MG PO TABS
50.0000 mg | ORAL_TABLET | Freq: Two times a day (BID) | ORAL | 3 refills | Status: DC
  Filled 2022-03-14 – 2022-05-20 (×2): qty 180, 90d supply, fill #0
  Filled 2022-08-22: qty 180, 90d supply, fill #1
  Filled 2022-11-25: qty 180, 90d supply, fill #2
  Filled 2023-02-27: qty 180, 90d supply, fill #3

## 2022-03-14 MED ORDER — SIMVASTATIN 20 MG PO TABS
20.0000 mg | ORAL_TABLET | Freq: Every evening | ORAL | 3 refills | Status: DC
  Filled 2022-03-14 (×2): qty 90, 90d supply, fill #0
  Filled 2022-06-13: qty 90, 90d supply, fill #1
  Filled 2022-09-11: qty 90, 90d supply, fill #2
  Filled 2022-12-12: qty 90, 90d supply, fill #3

## 2022-03-14 MED ORDER — RIVAROXABAN 20 MG PO TABS
20.0000 mg | ORAL_TABLET | Freq: Every day | ORAL | 3 refills | Status: DC
  Filled 2022-03-14: qty 90, fill #0
  Filled 2022-03-15 – 2022-03-21 (×2): qty 90, 90d supply, fill #0
  Filled 2022-06-13: qty 90, 90d supply, fill #1
  Filled 2022-09-16: qty 90, 90d supply, fill #2
  Filled 2022-12-12: qty 90, 90d supply, fill #3

## 2022-03-14 NOTE — Patient Instructions (Signed)

## 2022-03-14 NOTE — Progress Notes (Signed)
? ?OFFICE NOTE ? ?Chief Complaint:  ?Follow-up ? ?Primary Care Physician: ?[name], MD ? ?HPI:  ?Joe Lee. is a 88 year old gentleman with history of paroxysmal A-fib, mild nonobstructive coronary disease, hypertension and dyslipidemia. He also has CPAP and has done well with that. He remains active although has had a few pounds of weight gain, really has no symptoms. We talked about stroke prevention with regard to paroxysmal atrial fibrillation which he has had in the past and he was on Coumadin. He has since switched to Eliquis 5 mg by mouth twice a day and is tolerating this very well.  He denies any bleeding complications. He has not had any recurrent atrial fibrillation and he is aware of.  Overall he denies any chest pain or worsening shortness of breath. He does report some lower leg swelling, which is improved with wearing stockings.  He has no new complaints today. We obtained recent laboratory work shows an excellent cholesterol profile. Total cholesterol is 124, triglycerides 88, HDL 57 LDL 49. ? ? [name]  returns today. He is without any significant complaints.  Unfortunately, he recently had some lower GI bleeding. He underwent colonoscopy and there was a small polyp but no clear source for this. He since been started on iron, fiber, and a probiotic. He is in need of refills of his medications today. He continues on Eliquis and reports that the bleeding has subsided. ? ?11/05/2016 ? ?Joe Lee returns today for follow-up. He has had no new complaints since I last saw him. He is due for repeat lab work. He said he recently saw his primary care provider who drew a lipid profile and check his blood sugar which apparently has been running high. He may need medication adjustment. I will obtain those results. He is also due for refills of his medications. He denies any recurrent atrial fibrillation. He's had no bleeding complications on Eliquis. He denies any chest pain or  worsening shortness of breath. ? ?11/10/2017 ? ?Joe Lee was seen today for annual follow-up.  Overall he is without complaints except for some back pain.  He has fairly good energy level, but may have some fatigue attributed to aging.  He denies any chest pain or worsening shortness of breath.  His history of atrial fibrillation dates back to 2011 and he has been anticoagulated since then without recurrence.  Today an EKG was noted to be in atrial flutter with variable ventricular response at 77.  Blood pressure is elevated however he has not taken his medicines today.  When inquiring about his Eliquis use, he says he generally takes it twice a day but he has missed a few doses in the last couple of weeks.  We had a long discussion about atrial flutter and the differences between that and atrial fibrillation.  At this point is not clear that he is totally asymptomatic, however may be fatigue related to this.  He understands that his heart would work more efficiently if he were to be in rhythm. ? ?12/04/2017 ? ?Joe Lee returns today for follow-up of atrial flutter.  He reports his symptoms have improved, particularly his fatigue.  Despite this he remains in atrial flutter with 4-1 AV block at a rate of about 60.  He denies any chest pain or shortness of breath.  He does say that he has missed at least one dose of his anticoagulation.  He has trouble remembering the evening dose of his Eliquis.  He felt that a  once daily medicine would help with his compliance.  We were going to plan for cardioversion however we will need to consider either TEE guided cardioversion or another trial of 3 weeks of anticoagulation.  Based on the holidays it may be worthwhile to wait a few more weeks. ? ?02/18/2019 ? ?Joe Lee is seen today in follow-up.  He says is now retired from working at [employer].  He is been having problems with his back and is leery about surgery.  He seen a chiropractor with no significant benefits.   Fortunately he is maintaining sinus rhythm now almost a year after cardioversion.  He has been compliant with Xarelto.  He has had no bleeding issues.  His cholesterol is well controlled, in fact is lipids last summer showed total cholesterol of 131, HDL 62, LDL 54 and triglycerides 74.  Hemoglobin A1c is 6.1 but not on medication.  Hemoglobin is stable with no signs of anemia.  He takes fish oil and advised him against that since there is no significant data supporting benefit and he is also on simvastatin. ? ?03/09/2020 ? ?Joe Lee seen today in follow-up.  Overall he is doing well.  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  In general he feels well.  He denies any recurrent atrial flutter.  Cholesterol has been well controlled and his blood pressure is at goal.  He denies any shortness of breath or chest pain.  Labs from December 2020 showed total cholesterol 126, HDL 64, LDL 48 and triglycerides 65.  Hemoglobin A1c of 5.6. ? ?03/14/2022 ? ?Joe Lee is seen today in follow-up.  He continues to do very well.  He continues to work in [place] for [employer].  His blood pressure is well controlled.  He is on Xarelto for A-fib, however that is paroxysmal and he is in normal sinus rhythm today with an incomplete right bundle branch block.  He has had some issue with renal stones and has an ultrasound coming up this summer and follow-up with urology in [place]. ? ?PMHx:  ?Past Medical History:  ?Diagnosis Date  ? AF (atrial fibrillation) ([place]) 06/06/2010  ? 2D Echo EF>55% normal Echo  ? Arthritis   ? Chronic constipation   ? Chronically elevated hemidiaphragm   ? elevated left hemidiaphragm  ? Colon polyp 1/07  ? adenomatous  ? Diverticulitis   ? GERD (gastroesophageal reflux disease)   ? Heart murmur   ? High cholesterol   ? HTN (hypertension)   ? Hyperlipidemia   ? OSA on CPAP 11/09/2007  ? sleep study REM 33mns AHI was 3.64hr at 7cm  ? Sleep apnea   ? SOB (shortness of breath)  05/30/2010  ? stress test normal stress test EF63%  ? ? ?Past Surgical History:  ?Procedure Laterality Date  ? AV FISTULA REPAIR    ? CARDIAC CATHETERIZATION  02/06/2007  ? CARDIOVERSION N/A 01/01/2018  ? Procedure: CARDIOVERSION;  Surgeon: HPixie Casino MD;  [facility] [facility]  Service: Cardiovascular;  Laterality: N/A;  ? CATARACT EXTRACTION W/PHACO  07/28/2012  ? Procedure: CATARACT EXTRACTION PHACO AND INTRAOCULAR LENS PLACEMENT (I[facility]);  Surgeon: MElta GuadeloupeT. SGershon Crane MD;  Location: [facility];  Service: Ophthalmology;  Laterality: Right;  CDE=10.80  ? CHOLECYSTECTOMY    ? Dr [facility]  ? COLONOSCOPY N/A 07/23/2015  ? Procedure: COLONOSCOPY;  Surgeon: RRonald Lobo MD;  [facility];  Service: Endoscopy;  Laterality: N/A;  ? HEMORRHOID SURGERY    ? HERNIA REPAIR    ?  KNEE SURGERY    ? right-arthroscopy  ? ROTATOR CUFF REPAIR    ? right  ? ? ?FAMHx:  ?Family History  ?Problem Relation Age of Onset  ? Hypertension Sister   ? Hypertension Brother   ? ? ?SOCHx:  ? reports that he quit smoking about 44 years ago. His smoking use included cigarettes. He has a 1.50 pack-year smoking history. He has never used smokeless tobacco. He reports current alcohol use of about 8.0 standard drinks per week. He reports that he does not use drugs. ? ?ALLERGIES:  ?Allergies  ?Allergen Reactions  ? Penicillins Hives, Itching and Other (See Comments)  ?  Has patient had a PCN reaction causing immediate rash, facial/tongue/throat swelling, SOB or lightheadedness with hypotension: Yes ?Has patient had a PCN reaction causing severe rash involving mucus membranes or skin necrosis: No ?Has patient had a PCN reaction that required hospitalization: Yes ?Has patient had a PCN reaction occurring within the last 10 years: No ?If all of the above answers are "NO", then may proceed with Cephalosporin use. ?  ? Shellfish Allergy Hives  ? ? ?ROS: ?Pertinent items noted in HPI and remainder of comprehensive ROS otherwise negative. ? ?HOME  MEDS: ?Current Outpatient Medications  ?Medication Sig Dispense Refill  ? acetaminophen (TYLENOL) 325 MG tablet Take 650 mg by mouth every 6 (six) hours as needed for moderate pain or headache.    ? diltiazem (CARDIZE

## 2022-03-15 ENCOUNTER — Other Ambulatory Visit (HOSPITAL_COMMUNITY): Payer: Self-pay

## 2022-03-21 ENCOUNTER — Other Ambulatory Visit (HOSPITAL_COMMUNITY): Payer: Self-pay

## 2022-05-20 ENCOUNTER — Other Ambulatory Visit (HOSPITAL_COMMUNITY): Payer: Self-pay

## 2022-05-22 ENCOUNTER — Other Ambulatory Visit (HOSPITAL_COMMUNITY): Payer: Self-pay

## 2022-06-13 ENCOUNTER — Other Ambulatory Visit (HOSPITAL_COMMUNITY): Payer: Self-pay

## 2022-06-24 ENCOUNTER — Other Ambulatory Visit (HOSPITAL_COMMUNITY): Payer: Self-pay

## 2022-07-15 ENCOUNTER — Ambulatory Visit (HOSPITAL_COMMUNITY): Payer: Medicare PPO

## 2022-07-22 ENCOUNTER — Ambulatory Visit: Payer: Medicare PPO | Admitting: Urology

## 2022-07-25 ENCOUNTER — Other Ambulatory Visit: Payer: Self-pay | Admitting: Urology

## 2022-07-25 ENCOUNTER — Other Ambulatory Visit (HOSPITAL_COMMUNITY): Payer: Self-pay

## 2022-07-25 DIAGNOSIS — N200 Calculus of kidney: Secondary | ICD-10-CM

## 2022-07-26 ENCOUNTER — Ambulatory Visit: Payer: Medicare PPO | Admitting: Urology

## 2022-07-27 MED ORDER — TAMSULOSIN HCL 0.4 MG PO CAPS
0.4000 mg | ORAL_CAPSULE | Freq: Every day | ORAL | 11 refills | Status: DC
  Filled 2022-07-27: qty 30, 30d supply, fill #0
  Filled 2022-11-15: qty 30, 30d supply, fill #1
  Filled 2022-12-24: qty 30, 30d supply, fill #2
  Filled 2023-01-24: qty 90, 90d supply, fill #3
  Filled 2023-04-28 (×2): qty 90, 90d supply, fill #4

## 2022-07-29 ENCOUNTER — Other Ambulatory Visit (HOSPITAL_COMMUNITY): Payer: Self-pay

## 2022-07-30 ENCOUNTER — Other Ambulatory Visit (HOSPITAL_COMMUNITY): Payer: Self-pay

## 2022-07-30 ENCOUNTER — Ambulatory Visit: Payer: Medicare PPO | Admitting: Urology

## 2022-07-30 DIAGNOSIS — N200 Calculus of kidney: Secondary | ICD-10-CM

## 2022-07-31 ENCOUNTER — Other Ambulatory Visit: Payer: Self-pay | Admitting: Urology

## 2022-07-31 DIAGNOSIS — N200 Calculus of kidney: Secondary | ICD-10-CM

## 2022-08-02 ENCOUNTER — Ambulatory Visit (HOSPITAL_COMMUNITY)
Admission: RE | Admit: 2022-08-02 | Discharge: 2022-08-02 | Disposition: A | Discharge status: Home / Self Care | Payer: Medicare PPO | Source: Ambulatory Visit | Attending: Urology | Admitting: Urology

## 2022-08-02 DIAGNOSIS — N200 Calculus of kidney: Secondary | ICD-10-CM | POA: Insufficient documentation

## 2022-08-14 ENCOUNTER — Other Ambulatory Visit (HOSPITAL_COMMUNITY): Payer: Self-pay

## 2022-08-22 ENCOUNTER — Other Ambulatory Visit (HOSPITAL_COMMUNITY): Payer: Self-pay

## 2022-09-10 DIAGNOSIS — Z23 Encounter for immunization: Secondary | ICD-10-CM | POA: Diagnosis not present

## 2022-09-11 ENCOUNTER — Other Ambulatory Visit (HOSPITAL_COMMUNITY): Payer: Self-pay

## 2022-09-16 ENCOUNTER — Other Ambulatory Visit (HOSPITAL_COMMUNITY): Payer: Self-pay
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
## 2022-11-15 ENCOUNTER — Other Ambulatory Visit (HOSPITAL_COMMUNITY): Payer: Self-pay

## 2022-12-12 ENCOUNTER — Other Ambulatory Visit (HOSPITAL_COMMUNITY): Payer: Self-pay

## 2022-12-19 DIAGNOSIS — E6609 Other obesity due to excess calories: Secondary | ICD-10-CM | POA: Diagnosis not present

## 2022-12-19 DIAGNOSIS — I4891 Unspecified atrial fibrillation: Secondary | ICD-10-CM | POA: Diagnosis not present

## 2022-12-19 DIAGNOSIS — J302 Other seasonal allergic rhinitis: Secondary | ICD-10-CM | POA: Diagnosis not present

## 2022-12-19 DIAGNOSIS — Z6828 Body mass index (BMI) 28.0-28.9, adult: Secondary | ICD-10-CM | POA: Diagnosis not present

## 2022-12-24 ENCOUNTER — Other Ambulatory Visit (HOSPITAL_COMMUNITY): Payer: Self-pay

## 2023-01-24 ENCOUNTER — Other Ambulatory Visit (HOSPITAL_COMMUNITY): Payer: Self-pay

## 2023-02-12 ENCOUNTER — Other Ambulatory Visit (HOSPITAL_COMMUNITY): Payer: Self-pay

## 2023-02-27 ENCOUNTER — Other Ambulatory Visit (HOSPITAL_COMMUNITY): Payer: Self-pay

## 2023-03-10 ENCOUNTER — Telehealth: Payer: Self-pay | Admitting: Internal Medicine

## 2023-03-10 DIAGNOSIS — I4892 Unspecified atrial flutter: Secondary | ICD-10-CM | POA: Diagnosis not present

## 2023-03-10 NOTE — Telephone Encounter (Signed)
Placing lab order for BMP so Xarelto can be refilled

## 2023-03-11 LAB — BASIC METABOLIC PANEL
BUN/Creatinine Ratio: 14 (ref 10–24)
BUN: 15 mg/dL (ref 8–27)
CO2: 26 mmol/L (ref 20–29)
Calcium: 10.7 mg/dL — ABNORMAL HIGH (ref 8.6–10.2)
Chloride: 106 mmol/L (ref 96–106)
Creatinine, Ser: 1.07 mg/dL (ref 0.76–1.27)
Glucose: 94 mg/dL (ref 70–99)
Potassium: 4.7 mmol/L (ref 3.5–5.2)
Sodium: 145 mmol/L — ABNORMAL HIGH (ref 134–144)
eGFR: 68 mL/min/{1.73_m2} (ref >59)

## 2023-03-17 ENCOUNTER — Other Ambulatory Visit: Payer: Self-pay | Admitting: Internal Medicine

## 2023-03-17 ENCOUNTER — Other Ambulatory Visit (HOSPITAL_COMMUNITY): Payer: Self-pay

## 2023-03-17 MED ORDER — RIVAROXABAN 20 MG PO TABS
20.0000 mg | ORAL_TABLET | Freq: Every evening | ORAL | 1 refills | Status: DC
  Filled 2023-03-17: qty 90, 90d supply, fill #0
  Filled 2023-07-01: qty 90, 90d supply, fill #1

## 2023-03-21 ENCOUNTER — Other Ambulatory Visit: Payer: Self-pay | Admitting: Internal Medicine

## 2023-03-21 ENCOUNTER — Other Ambulatory Visit (HOSPITAL_COMMUNITY): Payer: Self-pay

## 2023-03-21 MED ORDER — SIMVASTATIN 20 MG PO TABS
20.0000 mg | ORAL_TABLET | Freq: Every evening | ORAL | 0 refills | Status: DC
  Filled 2023-03-21: qty 30, 30d supply, fill #0

## 2023-03-31 ENCOUNTER — Encounter: Payer: Self-pay | Admitting: Internal Medicine

## 2023-03-31 ENCOUNTER — Ambulatory Visit: Payer: Medicare PPO | Attending: Internal Medicine | Admitting: Internal Medicine

## 2023-03-31 VITALS — BP 120/72 | HR 65 | Ht 70.0 in | Wt 205.0 lb

## 2023-03-31 DIAGNOSIS — G4733 Obstructive sleep apnea (adult) (pediatric): Secondary | ICD-10-CM

## 2023-03-31 DIAGNOSIS — E782 Mixed hyperlipidemia: Secondary | ICD-10-CM

## 2023-03-31 DIAGNOSIS — I480 Paroxysmal atrial fibrillation: Secondary | ICD-10-CM

## 2023-03-31 DIAGNOSIS — Z7901 Long term (current) use of anticoagulants: Secondary | ICD-10-CM

## 2023-03-31 DIAGNOSIS — I10 Essential (primary) hypertension: Secondary | ICD-10-CM | POA: Diagnosis not present

## 2023-03-31 MED ORDER — RIVAROXABAN 20 MG PO TABS: 20.0000 mg | ORAL_TABLET | Freq: Every day | ORAL | 0 refills | Status: DC

## 2023-03-31 NOTE — Progress Notes (Signed)
OFFICE NOTE  Chief Complaint:  Follow-up  Primary Care Physician: [name], MD  HPI:  Joe Nigh Sr. is a 89 year old gentleman with history of paroxysmal A-fib, mild nonobstructive coronary disease, hypertension and dyslipidemia. He also has CPAP and has done well with that. He remains active although has had a few pounds of weight gain, really has no symptoms. We talked about stroke prevention with regard to paroxysmal atrial fibrillation which he has had in the past and he was on Coumadin. He has since switched to Eliquis 5 mg by mouth twice a day and is tolerating this very well.  He denies any bleeding complications. He has not had any recurrent atrial fibrillation and he is aware of.  Overall he denies any chest pain or worsening shortness of breath. He does report some lower leg swelling, which is improved with wearing stockings.  He has no new complaints today. We obtained recent laboratory work shows an excellent cholesterol profile. Total cholesterol is 124, triglycerides 88, HDL 57 LDL 49.   [name]  returns today. He is without any significant complaints.  Unfortunately, he recently had some lower GI bleeding. He underwent colonoscopy and there was a small polyp but no clear source for this. He since been started on iron, fiber, and a probiotic. He is in need of refills of his medications today. He continues on Eliquis and reports that the bleeding has subsided.  11/05/2016  Joe Lee returns today for follow-up. He has had no new complaints since I last saw him. He is due for repeat lab work. He said he recently saw his primary care provider who drew a lipid profile and check his blood sugar which apparently has been running high. He may need medication adjustment. I will obtain those results. He is also due for refills of his medications. He denies any recurrent atrial fibrillation. He's had no bleeding complications on Eliquis. He denies any chest pain or  worsening shortness of breath.  11/10/2017  Joe Lee was seen today for annual follow-up.  Overall he is without complaints except for some back pain.  He has fairly good energy level, but may have some fatigue attributed to aging.  He denies any chest pain or worsening shortness of breath.  His history of atrial fibrillation dates back to 2011 and he has been anticoagulated since then without recurrence.  Today an EKG was noted to be in atrial flutter with variable ventricular response at 77.  Blood pressure is elevated however he has not taken his medicines today.  When inquiring about his Eliquis use, he says he generally takes it twice a day but he has missed a few doses in the last couple of weeks.  We had a long discussion about atrial flutter and the differences between that and atrial fibrillation.  At this point is not clear that he is totally asymptomatic, however may be fatigue related to this.  He understands that his heart would work more efficiently if he were to be in rhythm.  12/04/2017  Joe Lee returns today for follow-up of atrial flutter.  He reports his symptoms have improved, particularly his fatigue.  Despite this he remains in atrial flutter with 4-1 AV block at a rate of about 60.  He denies any chest pain or shortness of breath.  He does say that he has missed at least one dose of his anticoagulation.  He has trouble remembering the evening dose of his Eliquis.  He felt that a  once daily medicine would help with his compliance.  We were going to plan for cardioversion however we will need to consider either TEE guided cardioversion or another trial of 3 weeks of anticoagulation.  Based on the holidays it may be worthwhile to wait a few more weeks.  02/18/2019  Joe Lee is seen today in follow-up.  He says is now retired from working at [employer].  He is been having problems with his back and is leery about surgery.  He seen a chiropractor with no significant benefits.   Fortunately he is maintaining sinus rhythm now almost a year after cardioversion.  He has been compliant with Xarelto.  He has had no bleeding issues.  His cholesterol is well controlled, in fact is lipids last summer showed total cholesterol of 131, HDL 62, LDL 54 and triglycerides 74.  Hemoglobin A1c is 6.1 but not on medication.  Hemoglobin is stable with no signs of anemia.  He takes fish oil and advised him against that since there is no significant data supporting benefit and he is also on simvastatin.  03/09/2020  Joe Lee seen today in follow-up.  Overall he is doing well.  He started a part-time job working for [employer] and record.  In general he feels well.  He denies any recurrent atrial flutter.  Cholesterol has been well controlled and his blood pressure is at goal.  He denies any shortness of breath or chest pain.  Labs from December 2020 showed total cholesterol 126, HDL 64, LDL 48 and triglycerides 65.  Hemoglobin A1c of 5.6.  03/14/2022  Joe Lee is seen today in follow-up.  He continues to do very well.  He continues to work in [place] for [employer].  His blood pressure is well controlled.  He is on Xarelto for A-fib, however that is paroxysmal and he is in normal sinus rhythm today with an incomplete right bundle branch block.  He has had some issue with renal stones and has an ultrasound coming up this summer and follow-up with urology in [place].  03/31/2023  Joe Lee returns today for follow-up.  He is overall without complaints.  He remains active and still works for the [employer].  He is on Xarelto and has had no bleeding issues.  Will provide a sample for him today if we have it.  He denies any recurrent A-fib.  He said no GI bleeding.  Blood pressure is well-controlled.  He is followed by his PCP who is this year.  PMHx:  Past Medical History:  Diagnosis Date   AF (atrial fibrillation) 06/06/2010   2D Echo EF>55% normal Echo   Arthritis     Chronic constipation    Chronically elevated hemidiaphragm    elevated left hemidiaphragm   Colon polyp 1/07   adenomatous   Diverticulitis    GERD (gastroesophageal reflux disease)    Heart murmur    High cholesterol    HTN (hypertension)    Hyperlipidemia    OSA on CPAP 11/09/2007   sleep study REM AHI was 3.64hr at 7cm   Sleep apnea    SOB (shortness of breath) 05/30/2010   stress test normal stress test EF63%    Past Surgical History:  Procedure Laterality Date   AV FISTULA REPAIR     CARDIAC CATHETERIZATION  02/06/2007   CARDIOVERSION N/A 01/01/2018   Procedure: CARDIOVERSION;  Surgeon: Chrystie Nose, MD;  Location: [facility];  Service: Cardiovascular;  Laterality: N/A;   CATARACT EXTRACTION  [facility]  07/28/2012   Procedure: CATARACT EXTRACTION PHACO AND INTRAOCULAR LENS PLACEMENT (IOC);  Surgeon: Loraine Leriche T. Nile Riggs, MD;  Location: [facility];  Service: Ophthalmology;  Laterality: Right;  CDE=10.80   CHOLECYSTECTOMY     Dr Katrinka Blazing -[facility]   COLONOSCOPY N/A 07/23/2015   Procedure: COLONOSCOPY;  Surgeon: Bernette Redbird, MD;  Location: [facility]COPY;  Service: Endoscopy;  Laterality: N/A;   HEMORRHOID SURGERY     HERNIA REPAIR     KNEE SURGERY     right-arthroscopy   ROTATOR CUFF REPAIR     right    FAMHx:  Family History  Problem Relation Age of Onset   Hypertension Sister    Hypertension Brother     SOCHx:   reports that he quit smoking about 45 years ago. His smoking use included cigarettes. He has a 1.50 pack-year smoking history. He has never used smokeless tobacco. He reports current alcohol use of about 8.0 standard drinks of alcohol per week. He reports that he does not use drugs.  ALLERGIES:  Allergies  Allergen Reactions   Penicillins Hives, Itching and Other (See Comments)    Has patient had a PCN reaction causing immediate rash, facial/tongue/throat swelling, SOB or lightheadedness with hypotension: Yes Has patient had a PCN reaction causing severe rash  involving mucus membranes or skin necrosis: No Has patient had a PCN reaction that required hospitalization: Yes Has patient had a PCN reaction occurring within the last 10 years: No If all of the above answers are "NO", then may proceed with Cephalosporin use.    Shellfish Allergy Hives    ROS: Pertinent items noted in HPI and remainder of comprehensive ROS otherwise negative.  HOME MEDS: Current Outpatient Medications  Medication Sig Dispense Refill   acetaminophen (TYLENOL) 325 MG tablet Take 650 mg by mouth every 6 (six) hours as needed for moderate pain or headache.     diltiazem (CARDIZEM CD) 120 MG 24 hr capsule Take 1 capsule (120 mg total) by mouth daily. 90 capsule 3   docusate sodium (COLACE) 100 MG capsule Take 100 mg by mouth daily as needed for mild constipation.     GARLIC PO Take 4,235 mg by mouth daily.     metoprolol tartrate (LOPRESSOR) 50 MG tablet Take 1 tablet (50 mg total) by mouth 2 (two) times daily. 180 tablet 3   Multiple Vitamin (MULTIVITAMIN WITH MINERALS) TABS Take 1 tablet by mouth daily.     Naphazoline HCl (CLEAR EYES OP) Place 2 drops into the left eye daily as needed (dry eyes).     Probiotic Product (PROBIOTIC DAILY) CAPS Take 1 capsule by mouth daily. 90 capsule 3   rivaroxaban (XARELTO) 20 MG TABS tablet Take 1 tablet by mouth daily with supper 90 tablet 1   Saw Palmetto 450 MG CAPS Take 1 capsule by mouth daily.     simvastatin (ZOCOR) 20 MG tablet Take 1 tablet (20 mg total) by mouth nightly at bedtime. 30 tablet 0   tamsulosin (FLOMAX) 0.4 MG CAPS capsule Take 1 capsule (0.4 mg total) by mouth daily. 30 capsule 11   Current Facility-Administered Medications  Medication Dose Route Frequency Provider Last Rate Last Admin   ciprofloxacin (CIPRO) tablet 500 mg  500 mg Oral Once McKenzie, Mardene Celeste, MD        LABS/IMAGING: No results found for this or any previous visit (from the past 48 hour(s)). No results found.  VITALS: BP 120/72   Pulse  65   Ht 5\' 10"  (1.778  m)   Wt 205 lb (93 kg)   SpO2 100%   BMI 29.41 kg/m   EXAM: General appearance: alert, no distress and mildly obese Neck: no carotid bruit, no JVD and thyroid not enlarged, symmetric, no tenderness/mass/nodules Lungs: clear to auscultation bilaterally Heart: irregularly irregular rhythm Abdomen: soft, non-tender; bowel sounds normal; no masses,  no organomegaly Extremities: extremities normal, atraumatic, no cyanosis or edema Pulses: 2+ and symmetric Skin: Skin color, texture, turgor normal. No rashes or lesions Neurologic: Grossly normal Psych: Pleasant  EKG: Sinus rhythm with PACs at 65, incomplete right bundle branch block, voltage criteria for LVH-personally reviewed  ASSESSMENT: Atrial flutter - CHADSVASC score of 3 Paroxysmal atrial fibrillatioin History of GIB on warfarin Hypertension Dyslipidemia - at goal Obstructive sleep apnea on CPAP Chronic venous insufficiency with edema  PLAN: 1.   Joe Lee is doing well without chest pain or shortness of breath.  There is no evidence of any recurrent atrial flutter or fibrillation.  He has had no recurrent GI bleeding on Xarelto.  Blood pressure is well-controlled.  His cholesterol has been at target with LDL less than 70 followed by his PCP.  He has a history of OSA on CPAP.  He continues to do very well.  I encouraged him to stay active and he says he plans to continue to work as long as he can.  Follow-up annually or sooner as necessary.  Chrystie Nose, MD, [facility], FACP  [place]  [facility]  Medical Director of the [facility]  Cardiovascular [facility] Reduction [facility] Attending Cardiologist  Direct Dial: [phone]  Fax: [phone]  Website:  [email or web address] Nicely Makaiyah Schweiger 03/31/2023, 8:54 AM

## 2023-03-31 NOTE — Patient Instructions (Signed)
Medication Instructions:  SAMPLES OF XARELTO 20mg  PROVIDED  *If you need a refill on your cardiac medications before your next appointment, please call your pharmacy*  Lab Work: None Ordered At This Time.  If you have labs (blood work) drawn today and your tests are completely normal, you will receive your results only by: MyChart Message (if you have MyChart) OR A paper copy in the mail If you have any lab test that is abnormal or we need to change your treatment, we will call you to review the results.  Testing/Procedures: None Ordered At This Time.   Follow-Up: At [facility], you and your health needs are our priority.  As part of our continuing mission to provide you with exceptional heart care, we have created designated Provider Care Teams.  These Care Teams include your primary Cardiologist (physician) and Advanced Practice Providers (APPs -  Physician Assistants and Nurse Practitioners) who all work together to provide you with the care you need, when you need it.  Your next appointment:   1 year(s)  Provider:   Chrystie Nose, MD

## 2023-04-28 ENCOUNTER — Other Ambulatory Visit (HOSPITAL_COMMUNITY): Payer: Self-pay

## 2023-04-28 ENCOUNTER — Other Ambulatory Visit: Payer: Self-pay | Admitting: Internal Medicine

## 2023-04-28 MED ORDER — SIMVASTATIN 20 MG PO TABS
20.0000 mg | ORAL_TABLET | Freq: Every evening | ORAL | 3 refills | Status: DC
  Filled 2023-04-28: qty 90, 90d supply, fill #0
  Filled 2023-07-29 (×2): qty 90, 90d supply, fill #1

## 2023-04-29 ENCOUNTER — Other Ambulatory Visit (HOSPITAL_COMMUNITY): Payer: Self-pay

## 2023-05-20 ENCOUNTER — Other Ambulatory Visit: Payer: Self-pay | Admitting: Internal Medicine

## 2023-05-20 ENCOUNTER — Other Ambulatory Visit (HOSPITAL_COMMUNITY): Payer: Self-pay

## 2023-05-20 MED ORDER — DILTIAZEM HCL ER COATED BEADS 120 MG PO CP24
120.0000 mg | ORAL_CAPSULE | Freq: Every day | ORAL | 3 refills | Status: DC
  Filled 2023-05-20: qty 90, 90d supply, fill #0
  Filled 2023-08-20: qty 90, 90d supply, fill #1
  Filled 2024-02-20: qty 90, 90d supply, fill #2

## 2023-05-21 ENCOUNTER — Other Ambulatory Visit (HOSPITAL_COMMUNITY): Payer: Self-pay

## 2023-06-02 ENCOUNTER — Other Ambulatory Visit (HOSPITAL_COMMUNITY): Payer: Self-pay

## 2023-06-02 ENCOUNTER — Other Ambulatory Visit: Payer: Self-pay | Admitting: Internal Medicine

## 2023-06-02 MED ORDER — METOPROLOL TARTRATE 50 MG PO TABS
50.0000 mg | ORAL_TABLET | Freq: Two times a day (BID) | ORAL | 3 refills | Status: DC
  Filled 2023-06-02: qty 180, 90d supply, fill #0
  Filled 2023-09-05: qty 180, 90d supply, fill #1
  Filled 2023-12-08: qty 180, 90d supply, fill #2
  Filled 2024-03-11: qty 180, 90d supply, fill #3

## 2023-06-04 ENCOUNTER — Other Ambulatory Visit (HOSPITAL_COMMUNITY): Payer: Self-pay

## 2023-06-11 DIAGNOSIS — H903 Sensorineural hearing loss, bilateral: Secondary | ICD-10-CM | POA: Diagnosis not present

## 2023-06-18 DIAGNOSIS — H6121 Impacted cerumen, right ear: Secondary | ICD-10-CM | POA: Diagnosis not present

## 2023-06-18 DIAGNOSIS — L299 Pruritus, unspecified: Secondary | ICD-10-CM | POA: Diagnosis not present

## 2023-07-01 ENCOUNTER — Other Ambulatory Visit (HOSPITAL_COMMUNITY): Payer: Self-pay

## 2023-07-29 ENCOUNTER — Other Ambulatory Visit: Payer: Self-pay | Admitting: Urology

## 2023-07-29 ENCOUNTER — Other Ambulatory Visit (HOSPITAL_COMMUNITY): Payer: Self-pay

## 2023-07-29 DIAGNOSIS — N200 Calculus of kidney: Secondary | ICD-10-CM

## 2023-08-01 ENCOUNTER — Other Ambulatory Visit: Payer: Self-pay | Admitting: Urology

## 2023-08-01 ENCOUNTER — Other Ambulatory Visit (HOSPITAL_COMMUNITY): Payer: Self-pay

## 2023-08-01 ENCOUNTER — Other Ambulatory Visit: Payer: Self-pay | Admitting: Internal Medicine

## 2023-08-01 DIAGNOSIS — N200 Calculus of kidney: Secondary | ICD-10-CM

## 2023-08-04 ENCOUNTER — Other Ambulatory Visit (HOSPITAL_COMMUNITY): Payer: Self-pay

## 2023-08-04 NOTE — Telephone Encounter (Signed)
Would recommend he reach out to his urologist.  Dr Rexene Edison

## 2023-08-06 ENCOUNTER — Other Ambulatory Visit (HOSPITAL_COMMUNITY): Payer: Self-pay

## 2023-08-06 ENCOUNTER — Other Ambulatory Visit: Payer: Self-pay | Admitting: Internal Medicine

## 2023-08-06 ENCOUNTER — Other Ambulatory Visit: Payer: Self-pay

## 2023-08-06 DIAGNOSIS — N200 Calculus of kidney: Secondary | ICD-10-CM

## 2023-08-06 MED ORDER — TAMSULOSIN HCL 0.4 MG PO CAPS
0.4000 mg | ORAL_CAPSULE | Freq: Every day | ORAL | 11 refills | Status: DC
  Filled 2023-09-05: qty 90, 90d supply, fill #0
  Filled 2023-09-05: qty 30, 30d supply, fill #0
  Filled 2023-12-03: qty 90, 90d supply, fill #1
  Filled 2024-02-27: qty 90, 90d supply, fill #2
  Filled 2024-06-03: qty 90, 90d supply, fill #3

## 2023-08-06 MED ORDER — TAMSULOSIN HCL 0.4 MG PO CAPS
0.4000 mg | ORAL_CAPSULE | Freq: Every day | ORAL | 11 refills | Status: DC
  Filled 2023-08-06: qty 30, 30d supply, fill #0

## 2023-08-06 NOTE — Progress Notes (Signed)
Pt walked into the office requesting a refill on his tamsulosin. This nurse let pt know he had 11 refills left and this would be sent to pharmacy. Pt. Verbalized understanding.

## 2023-08-07 ENCOUNTER — Other Ambulatory Visit (HOSPITAL_COMMUNITY): Payer: Self-pay

## 2023-08-20 ENCOUNTER — Other Ambulatory Visit (HOSPITAL_COMMUNITY): Payer: Self-pay

## 2023-08-26 DIAGNOSIS — R6889 Other general symptoms and signs: Secondary | ICD-10-CM | POA: Diagnosis not present

## 2023-08-26 DIAGNOSIS — J069 Acute upper respiratory infection, unspecified: Secondary | ICD-10-CM | POA: Diagnosis not present

## 2023-08-26 DIAGNOSIS — Z20828 Contact with and (suspected) exposure to other viral communicable diseases: Secondary | ICD-10-CM | POA: Diagnosis not present

## 2023-08-26 DIAGNOSIS — Z6827 Body mass index (BMI) 27.0-27.9, adult: Secondary | ICD-10-CM | POA: Diagnosis not present

## 2023-08-26 DIAGNOSIS — E663 Overweight: Secondary | ICD-10-CM | POA: Diagnosis not present

## 2023-09-05 ENCOUNTER — Other Ambulatory Visit (HOSPITAL_COMMUNITY): Payer: Self-pay

## 2023-09-05 DIAGNOSIS — Z6827 Body mass index (BMI) 27.0-27.9, adult: Secondary | ICD-10-CM | POA: Diagnosis not present

## 2023-09-05 DIAGNOSIS — G4733 Obstructive sleep apnea (adult) (pediatric): Secondary | ICD-10-CM | POA: Diagnosis not present

## 2023-09-05 DIAGNOSIS — I4891 Unspecified atrial fibrillation: Secondary | ICD-10-CM | POA: Diagnosis not present

## 2023-09-05 DIAGNOSIS — E663 Overweight: Secondary | ICD-10-CM | POA: Diagnosis not present

## 2023-09-05 DIAGNOSIS — I10 Essential (primary) hypertension: Secondary | ICD-10-CM | POA: Diagnosis not present

## 2023-09-05 DIAGNOSIS — E782 Mixed hyperlipidemia: Secondary | ICD-10-CM | POA: Diagnosis not present

## 2023-09-05 DIAGNOSIS — J069 Acute upper respiratory infection, unspecified: Secondary | ICD-10-CM | POA: Diagnosis not present

## 2023-09-05 DIAGNOSIS — R7309 Other abnormal glucose: Secondary | ICD-10-CM | POA: Diagnosis not present

## 2023-09-05 DIAGNOSIS — Z0001 Encounter for general adult medical examination with abnormal findings: Secondary | ICD-10-CM | POA: Diagnosis not present

## 2023-09-05 MED ORDER — XARELTO 20 MG PO TABS
20.0000 mg | ORAL_TABLET | Freq: Every day | ORAL | 3 refills | Status: DC
  Filled 2023-09-05: qty 90, 90d supply, fill #0

## 2023-09-05 MED ORDER — DILTIAZEM HCL ER COATED BEADS 120 MG PO CP24
120.0000 mg | ORAL_CAPSULE | Freq: Every day | ORAL | 3 refills | Status: AC
  Filled 2023-09-05 – 2023-11-20 (×2): qty 90, 90d supply, fill #0
  Filled 2024-05-20: qty 90, 90d supply, fill #1

## 2023-09-05 MED ORDER — PREDNISONE 10 MG PO TABS
ORAL_TABLET | ORAL | 0 refills | Status: AC
  Filled 2023-09-05: qty 45, 15d supply, fill #0

## 2023-09-05 MED ORDER — AZITHROMYCIN 250 MG PO TABS
ORAL_TABLET | ORAL | 0 refills | Status: AC
  Filled 2023-09-05: qty 6, 5d supply, fill #0

## 2023-09-05 MED ORDER — SIMVASTATIN 20 MG PO TABS
20.0000 mg | ORAL_TABLET | Freq: Every day | ORAL | 3 refills | Status: DC
  Filled 2023-09-05 – 2023-10-30 (×2): qty 90, 90d supply, fill #0
  Filled 2024-01-30: qty 90, 90d supply, fill #1
  Filled 2024-05-07: qty 90, 90d supply, fill #2

## 2023-09-05 MED ORDER — METOPROLOL TARTRATE 50 MG PO TABS
50.0000 mg | ORAL_TABLET | Freq: Two times a day (BID) | ORAL | 3 refills | Status: DC
  Filled 2023-09-05 – 2024-06-17 (×2): qty 180, 90d supply, fill #0

## 2023-09-29 ENCOUNTER — Other Ambulatory Visit: Payer: Self-pay | Admitting: Internal Medicine

## 2023-09-29 ENCOUNTER — Other Ambulatory Visit (HOSPITAL_COMMUNITY): Payer: Self-pay

## 2023-09-29 ENCOUNTER — Telehealth: Payer: Self-pay | Admitting: Internal Medicine

## 2023-09-29 MED ORDER — RIVAROXABAN 20 MG PO TABS
20.0000 mg | ORAL_TABLET | Freq: Every evening | ORAL | 1 refills | Status: DC
  Filled 2023-09-29: qty 90, 90d supply, fill #0
  Filled 2024-01-02: qty 90, 90d supply, fill #1
  Filled 2024-04-02 (×2): qty 90, 90d supply, fill #2
  Filled 2024-07-01: qty 90, 90d supply, fill #3

## 2023-09-29 MED ORDER — RIVAROXABAN 20 MG PO TABS: 20.0000 mg | ORAL_TABLET | Freq: Every day | ORAL | Status: DC

## 2023-09-29 NOTE — Telephone Encounter (Signed)
Prescription refill request for Xarelto received.  Indication:AFIB Last office visit:4/24 Weight:93  kg Age:89 Scr:1.07  3/24 CrCl:63.98  ml/min  Prescription refilled

## 2023-09-29 NOTE — Telephone Encounter (Signed)
Patient stopped by this morning stating he needs a refill on his Xarelto 20 MG Was wondering if it's possible to get more than a 90 day supply

## 2023-09-29 NOTE — Telephone Encounter (Signed)
Message routed to [facility] Anticoag team for assistance with Xarelto refill.

## 2023-10-02 ENCOUNTER — Other Ambulatory Visit (HOSPITAL_COMMUNITY): Payer: Self-pay

## 2023-10-30 ENCOUNTER — Other Ambulatory Visit (HOSPITAL_COMMUNITY): Payer: Self-pay

## 2023-11-20 ENCOUNTER — Other Ambulatory Visit (HOSPITAL_COMMUNITY): Payer: Self-pay

## 2023-12-03 ENCOUNTER — Other Ambulatory Visit (HOSPITAL_COMMUNITY): Payer: Self-pay

## 2023-12-08 ENCOUNTER — Other Ambulatory Visit (HOSPITAL_COMMUNITY): Payer: Self-pay

## 2024-01-02 ENCOUNTER — Other Ambulatory Visit (HOSPITAL_COMMUNITY): Payer: Self-pay

## 2024-01-30 ENCOUNTER — Other Ambulatory Visit (HOSPITAL_COMMUNITY): Payer: Self-pay

## 2024-02-20 ENCOUNTER — Other Ambulatory Visit (HOSPITAL_COMMUNITY): Payer: Self-pay

## 2024-02-27 ENCOUNTER — Other Ambulatory Visit (HOSPITAL_COMMUNITY): Payer: Self-pay

## 2024-03-11 ENCOUNTER — Other Ambulatory Visit (HOSPITAL_COMMUNITY): Payer: Self-pay

## 2024-04-02 ENCOUNTER — Other Ambulatory Visit (HOSPITAL_COMMUNITY): Payer: Self-pay

## 2024-04-22 ENCOUNTER — Ambulatory Visit: Payer: Medicare PPO | Attending: Internal Medicine | Admitting: Internal Medicine

## 2024-04-23 ENCOUNTER — Encounter: Payer: Self-pay | Admitting: Internal Medicine

## 2024-05-07 ENCOUNTER — Other Ambulatory Visit (HOSPITAL_COMMUNITY): Payer: Self-pay

## 2024-05-07 ENCOUNTER — Telehealth: Payer: Self-pay | Admitting: Internal Medicine

## 2024-05-20 ENCOUNTER — Other Ambulatory Visit (HOSPITAL_COMMUNITY): Payer: Self-pay

## 2024-06-03 ENCOUNTER — Other Ambulatory Visit (HOSPITAL_COMMUNITY): Payer: Self-pay

## 2024-06-17 ENCOUNTER — Other Ambulatory Visit (HOSPITAL_COMMUNITY): Payer: Self-pay

## 2024-07-01 ENCOUNTER — Other Ambulatory Visit (HOSPITAL_COMMUNITY): Payer: Self-pay

## 2024-08-06 ENCOUNTER — Ambulatory Visit: Attending: Internal Medicine | Admitting: Internal Medicine

## 2024-08-06 ENCOUNTER — Encounter: Payer: Self-pay | Admitting: Pharmacist

## 2024-08-06 ENCOUNTER — Other Ambulatory Visit: Payer: Self-pay

## 2024-08-06 ENCOUNTER — Other Ambulatory Visit (HOSPITAL_COMMUNITY): Payer: Self-pay

## 2024-08-06 ENCOUNTER — Encounter: Payer: Self-pay | Admitting: Internal Medicine

## 2024-08-06 VITALS — BP 126/76 | HR 63 | Ht 69.0 in | Wt 189.0 lb

## 2024-08-06 DIAGNOSIS — I480 Paroxysmal atrial fibrillation: Secondary | ICD-10-CM | POA: Diagnosis not present

## 2024-08-06 DIAGNOSIS — G4733 Obstructive sleep apnea (adult) (pediatric): Secondary | ICD-10-CM | POA: Diagnosis not present

## 2024-08-06 DIAGNOSIS — I10 Essential (primary) hypertension: Secondary | ICD-10-CM

## 2024-08-06 DIAGNOSIS — E782 Mixed hyperlipidemia: Secondary | ICD-10-CM | POA: Diagnosis not present

## 2024-08-06 DIAGNOSIS — Z7901 Long term (current) use of anticoagulants: Secondary | ICD-10-CM | POA: Diagnosis not present

## 2024-08-06 MED ORDER — SIMVASTATIN 20 MG PO TABS
20.0000 mg | ORAL_TABLET | Freq: Every evening | ORAL | 3 refills | Status: AC
  Filled 2024-08-06 – 2024-11-18 (×3): qty 90, 90d supply, fill #0

## 2024-08-06 MED ORDER — RIVAROXABAN 20 MG PO TABS
20.0000 mg | ORAL_TABLET | Freq: Every day | ORAL | 3 refills | Status: AC
  Filled 2024-08-06 – 2024-10-01 (×2): qty 90, 90d supply, fill #0
  Filled 2024-12-24: qty 30, 30d supply, fill #1

## 2024-08-06 NOTE — Progress Notes (Signed)
 OFFICE NOTE  Chief Complaint:  Follow-up  Primary Care Physician: Marvine Rush, MD  HPI:  Joe LITTIE Minerva Sr. is a 89 year old gentleman with history of paroxysmal A-fib, mild nonobstructive coronary disease, hypertension and dyslipidemia. He also has CPAP and has done well with that. He remains active although has had a few pounds of weight gain, really has no symptoms. We talked about stroke prevention with regard to paroxysmal atrial fibrillation which he has had in the past and he was on Coumadin. He has since switched to Eliquis  5 mg by mouth twice a day and is tolerating this very well.  He denies any bleeding complications. He has not had any recurrent atrial fibrillation and he is aware of.  Overall he denies any chest pain or worsening shortness of breath. He does report some lower leg swelling, which is improved with wearing stockings.  He has no new complaints today. We obtained recent laboratory work shows an excellent cholesterol profile. Total cholesterol is 124, triglycerides 88, HDL 57 LDL 49.   Joe Lee  returns today. He is without any significant complaints.  Unfortunately, he recently had some lower GI bleeding. He underwent colonoscopy and there was a small polyp but no clear source for this. He since been started on iron, fiber, and a probiotic. He is in need of refills of his medications today. He continues on Eliquis  and reports that the bleeding has subsided.  11/05/2016  Joe Lee returns today for follow-up. He has had no new complaints since I last saw him. He is due for repeat lab work. He said he recently saw his primary care provider who drew a lipid profile and check his blood sugar which apparently has been running high. He may need medication adjustment. I will obtain those results. He is also due for refills of his medications. He denies any recurrent atrial fibrillation. He's had no bleeding complications on Eliquis . He denies any chest pain or  worsening shortness of breath.  11/10/2017  Joe Lee was seen today for annual follow-up.  Overall he is without complaints except for some back pain.  He has fairly good energy level, but may have some fatigue attributed to aging.  He denies any chest pain or worsening shortness of breath.  His history of atrial fibrillation dates back to 2011 and he has been anticoagulated since then without recurrence.  Today an EKG was noted to be in atrial flutter with variable ventricular response at 77.  Blood pressure is elevated however he has not taken his medicines today.  When inquiring about his Eliquis  use, he says he generally takes it twice a day but he has missed a few doses in the last couple of weeks.  We had a long discussion about atrial flutter and the differences between that and atrial fibrillation.  At this point is not clear that he is totally asymptomatic, however may be fatigue related to this.  He understands that his heart would work more efficiently if he were to be in rhythm.  12/04/2017  Joe Lee returns today for follow-up of atrial flutter.  He reports his symptoms have improved, particularly his fatigue.  Despite this he remains in atrial flutter with 4-1 AV block at a rate of about 60.  He denies any chest pain or shortness of breath.  He does say that he has missed at least one dose of his anticoagulation.  He has trouble remembering the evening dose of his Eliquis .  He felt that a  once daily medicine would help with his compliance.  We were going to plan for cardioversion however we will need to consider either TEE guided cardioversion or another trial of 3 weeks of anticoagulation.  Based on the holidays it may be worthwhile to wait a few more weeks.  02/18/2019  Joe Lee is seen today in follow-up.  He says is now retired from working at [employer].  He is been having problems with his back and is leery about surgery.  He seen a chiropractor with no significant benefits.   Fortunately he is maintaining sinus rhythm now almost a year after cardioversion.  He has been compliant with Xarelto .  He has had no bleeding issues.  His cholesterol is well controlled, in fact is lipids last summer showed total cholesterol of 131, HDL 62, LDL 54 and triglycerides 74.  Hemoglobin A1c is 6.1 but not on medication.  Hemoglobin is stable with no signs of anemia.  He takes fish oil and advised him against that since there is no significant data supporting benefit and he is also on simvastatin .  03/09/2020  Joe Lee seen today in follow-up.  Overall he is doing well.  He started a part-time job working for [employer] and record.  In general he feels well.  He denies any recurrent atrial flutter.  Cholesterol has been well controlled and his blood pressure is at goal.  He denies any shortness of breath or chest pain.  Labs from December 2020 showed total cholesterol 126, HDL 64, LDL 48 and triglycerides 65.  Hemoglobin A1c of 5.6.  03/14/2022  Joe Lee is seen today in follow-up.  He continues to do very well.  He continues to work in [place] for [employer].  His blood pressure is well controlled.  He is on Xarelto  for A-fib, however that is paroxysmal and he is in normal sinus rhythm today with an incomplete right bundle branch block.  He has had some issue with renal stones and has an ultrasound coming up this summer and follow-up with urology in [place].  03/31/2023  Joe Lee returns today for follow-up.  He is overall without complaints.  He remains active and still works for the [employer].  He is on Xarelto  and has had no bleeding issues.  Will provide a sample for him today if we have it.  He denies any recurrent A-fib.  He said no GI bleeding.  Blood pressure is well-controlled.  He is followed by his PCP who is this year.  08/06/2024  Joe Lee is seen today in follow-up.  He seems to be doing well.  He continues to work about 5 hours during the day.   He says this keeps him active.  He denies any recurrent A-fib or flutter.  He said no GI bleeding issues on Xarelto .  He is out of his simvastatin  which we will refill.  He gets his medicines through most [facility] pharmacy however we will see if we can arrange for him to get his medicine shipped to [place] [facility] so he does not have to drive here.  PMHx:  Past Medical History:  Diagnosis Date   AF (atrial fibrillation) (HCC) 06/06/2010   2D Echo EF>55% normal Echo   Arthritis    Chronic constipation    Chronically elevated hemidiaphragm    elevated left hemidiaphragm   Colon polyp 1/07   adenomatous   Diverticulitis    GERD (gastroesophageal reflux disease)    Heart murmur    High cholesterol  HTN (hypertension)    Hyperlipidemia    OSA on CPAP 11/09/2007   sleep study REM AHI was 3.64hr at 7cm   Sleep apnea    SOB (shortness of breath) 05/30/2010   stress test normal stress test EF63%    Past Surgical History:  Procedure Laterality Date   AV FISTULA REPAIR     CARDIAC CATHETERIZATION  02/06/2007   CARDIOVERSION N/A 01/01/2018   Procedure: CARDIOVERSION;  Surgeon: Mona Vinie BROCKS, MD;  Location[facility] ENDOSCOPY;  Service: Cardiovascular;  Laterality: N/A;   CATARACT EXTRACTION W/PHACO  07/28/2012   Procedure: CATARACT EXTRACTION PHACO AND INTRAOCULAR LENS PLACEMENT (IOC);  Surgeon: Oneil T. Roz, MD;  Location: [facility] ORS;  Service: Ophthalmology;  Laterality: Right;  CDE=10.80   CHOLECYSTECTOMY     Dr Claudene -[facility]   COLONOSCOPY N/A 07/23/2015   Procedure: COLONOSCOPY;  Surgeon: Lamar Bunk, MD;  Location: [facility] ENDOSCOPY;  Service: Endoscopy;  Laterality: N/A;   HEMORRHOID SURGERY     HERNIA REPAIR     KNEE SURGERY     right-arthroscopy   ROTATOR CUFF REPAIR     right    FAMHx:  Family History  Problem Relation Age of Onset   Hypertension Sister    Hypertension Brother     SOCHx:   reports that he quit smoking about 47 years ago. His smoking use included cigarettes. He  started smoking about 53 years ago. He has a 1.5 pack-year smoking history. He has never used smokeless tobacco. He reports current alcohol use of about 8.0 standard drinks of alcohol per week. He reports that he does not use drugs.  ALLERGIES:  Allergies  Allergen Reactions   Penicillins Hives, Itching and Other (See Comments)    Has patient had a PCN reaction causing immediate rash, facial/tongue/throat swelling, SOB or lightheadedness with hypotension: Yes Has patient had a PCN reaction causing severe rash involving mucus membranes or skin necrosis: No Has patient had a PCN reaction that required hospitalization: Yes Has patient had a PCN reaction occurring within the last 10 years: No If all of the above answers are NO, then may proceed with Cephalosporin use.    Shellfish Allergy Hives    ROS: Pertinent items noted in HPI and remainder of comprehensive ROS otherwise negative.  HOME MEDS: Current Outpatient Medications  Medication Sig Dispense Refill   acetaminophen  (TYLENOL ) 325 MG tablet Take 650 mg by mouth every 6 (six) hours as needed for moderate pain or headache.     azithromycin  (ZITHROMAX  Z-PAK) 250 MG tablet Take 2 tablets by mouth on day 1, then 1 tablet by mouth everyday days 2-5. 6 tablet 0   diltiazem  (CARDIZEM  CD) 120 MG 24 hr capsule Take 1 capsule (120 mg total) by mouth daily. 90 capsule 3   diltiazem  (CARTIA  XT) 120 MG 24 hr capsule Take 1 capsule (120 mg total) by mouth daily. 90 capsule 3   docusate sodium (COLACE) 100 MG capsule Take 100 mg by mouth daily as needed for mild constipation.     GARLIC PO Take 1,000 mg by mouth daily.     metoprolol  tartrate (LOPRESSOR ) 50 MG tablet Take 1 tablet (50 mg total) by mouth 2 (two) times daily. 180 tablet 3   metoprolol  tartrate (LOPRESSOR ) 50 MG tablet Take 1 tablet (50 mg total) by mouth 2 (two) times daily. 180 tablet 3   Multiple Vitamin (MULTIVITAMIN WITH MINERALS) TABS Take 1 tablet by mouth daily.      Naphazoline HCl (CLEAR EYES OP)  Place 2 drops into the left eye daily as needed (dry eyes).     Probiotic Product (PROBIOTIC DAILY) CAPS Take 1 capsule by mouth daily. 90 capsule 3   rivaroxaban  (XARELTO ) 20 MG TABS tablet Take 1 tablet (20 mg total) by mouth daily with supper. 14 tablet 0   rivaroxaban  (XARELTO ) 20 MG TABS tablet Take 1 tablet (20 mg total) by mouth daily. 90 tablet 3   rivaroxaban  (XARELTO ) 20 MG TABS tablet Take 1 tablet by mouth daily with supper 180 tablet 1   rivaroxaban  (XARELTO ) 20 MG TABS tablet Take 1 tablet (20 mg total) by mouth daily with supper.     Saw Palmetto 450 MG CAPS Take 1 capsule by mouth daily.     simvastatin  (ZOCOR ) 20 MG tablet Take 1 tablet (20 mg total) by mouth nightly at bedtime. 90 tablet 3   simvastatin  (ZOCOR ) 20 MG tablet Take 1 tablet (20 mg total) by mouth daily. 90 tablet 3   tamsulosin  (FLOMAX ) 0.4 MG CAPS capsule Take 1 capsule (0.4 mg total) by mouth daily. 30 capsule 11   Current Facility-Administered Medications  Medication Dose Route Frequency Provider Last Rate Last Admin   ciprofloxacin  (CIPRO ) tablet 500 mg  500 mg Oral Once McKenzie, Belvie CROME, MD        LABS/IMAGING: No results found for this or any previous visit (from the past 48 hours). No results found.  VITALS: BP 126/76   Pulse 63   Ht 5' 9 (1.753 m)   Wt 189 lb (85.7 kg)   SpO2 99%   BMI 27.91 kg/m   EXAM: General appearance: alert, no distress and mildly obese Neck: no carotid bruit, no JVD and thyroid  not enlarged, symmetric, no tenderness/mass/nodules Lungs: clear to auscultation bilaterally Heart: irregularly irregular rhythm Abdomen: soft, non-tender; bowel sounds normal; no masses,  no organomegaly Extremities: extremities normal, atraumatic, no cyanosis or edema Pulses: 2+ and symmetric Skin: Skin color, texture, turgor normal. No rashes or lesions Neurologic: Grossly normal Psych: Pleasant  EKG: EKG Interpretation Date/Time:  Friday August 06 2024 10:12:05 EDT Ventricular Rate:  63 PR Interval:  172 QRS Duration:  94 QT Interval:  408 QTC Calculation: 417 R Axis:   -24  Text Interpretation: Normal sinus rhythm Incomplete right bundle branch block Nonspecific T wave abnormality When compared with ECG of 17-Nov-2020 18:14, No significant change was found Confirmed by Mona Kent [phone]) on 08/06/2024 10:18:28 AM    ASSESSMENT: Atrial flutter - CHADSVASC score of 3 Paroxysmal atrial fibrillatioin History of GIB on warfarin Hypertension Dyslipidemia - at goal Obstructive sleep apnea on CPAP Chronic venous insufficiency with edema  PLAN: 1.   Mr. Mikes continues to do well denies any chest pain or worsening shortness of breath.  He has had no recurrent A-fib or flutter that he is aware of.  EKG today shows sinus rhythm.  He has had no bleeding issues on the Xarelto .  Blood pressure is well-controlled.  Will try to arrange his medications to be sent to him through the [facility] so does not have to drive to [place].  We provided refills today as well.  Follow-up annually or sooner as necessary.  Kent KYM Mona, MD, [facility], FNLA, FACP  [place]  [facility]  Medical Director of the Advanced Lipid Disorders &  Cardiovascular Risk Reduction Clinic Diplomate of the American Board of Clinical Lipidology Attending Cardiologist  Direct Dial: [phone]  Fax: [phone]  Website:  [email or web address]   Kent JAYSON Mona  08/06/2024, 10:18 AM

## 2024-08-06 NOTE — Patient Instructions (Signed)
 Medication Instructions:  NO CHANGES  *If you need a refill on your cardiac medications before your next appointment, please call your pharmacy*   Follow-Up: At [facility], you and your health needs are our priority.  As part of our continuing mission to provide you with exceptional heart care, our providers are all part of one team.  This team includes your primary Cardiologist (physician) and Advanced Practice Providers or APPs (Physician Assistants and Nurse Practitioners) who all work together to provide you with the care you need, when you need it.  Your next appointment:    12 months with Dr. Mona or PA/NP  We recommend signing up for the patient portal called MyChart.  Sign up information is provided on this After Visit Summary.  MyChart is used to connect with patients for Virtual Visits (Telemedicine).  Patients are able to view lab/test results, encounter notes, upcoming appointments, etc.  Non-urgent messages can be sent to your provider as well.   To learn more about what you can do with MyChart, go to [email or web address].

## 2024-08-11 ENCOUNTER — Other Ambulatory Visit: Payer: Self-pay

## 2024-08-13 ENCOUNTER — Other Ambulatory Visit (HOSPITAL_COMMUNITY): Payer: Self-pay

## 2024-08-20 ENCOUNTER — Telehealth: Payer: Self-pay

## 2024-08-20 ENCOUNTER — Other Ambulatory Visit (HOSPITAL_COMMUNITY): Payer: Self-pay

## 2024-08-20 MED ORDER — DILTIAZEM HCL ER COATED BEADS 120 MG PO CP24
120.0000 mg | ORAL_CAPSULE | Freq: Every day | ORAL | 3 refills | Status: AC
  Filled 2024-08-20: qty 90, 90d supply, fill #0
  Filled 2024-11-18: qty 90, 90d supply, fill #1

## 2024-08-20 NOTE — Telephone Encounter (Signed)
 Patient walking in with empty bottle of cardizem  CD 120 mg. Patient requests refill, recently seen by Dr. Mona on 08/06/24. Cardizem  CD last ordered by Dr. Mona on 05/2023, but there is also a Cardizem  XT 120 mg listed on med list ordered by PCP Dr. Norleen General. Patient states he is totally out but states he is not sure of another dose/script for cardizem . Refill sent to [facility].

## 2024-09-02 ENCOUNTER — Other Ambulatory Visit: Payer: Self-pay | Admitting: Internal Medicine

## 2024-09-02 ENCOUNTER — Other Ambulatory Visit (HOSPITAL_COMMUNITY): Payer: Self-pay

## 2024-09-02 DIAGNOSIS — N200 Calculus of kidney: Secondary | ICD-10-CM

## 2024-09-02 MED ORDER — TAMSULOSIN HCL 0.4 MG PO CAPS
0.4000 mg | ORAL_CAPSULE | Freq: Every day | ORAL | 11 refills | Status: AC
Start: 2024-09-02 — End: ?
  Filled 2024-09-02: qty 30, 30d supply, fill #0
  Filled 2024-10-01: qty 30, 30d supply, fill #1
  Filled 2024-11-05: qty 30, 30d supply, fill #2
  Filled 2024-12-02: qty 90, 90d supply, fill #3

## 2024-09-16 ENCOUNTER — Other Ambulatory Visit: Payer: Self-pay | Admitting: Internal Medicine

## 2024-09-16 ENCOUNTER — Other Ambulatory Visit (HOSPITAL_COMMUNITY): Payer: Self-pay

## 2024-09-16 DIAGNOSIS — I10 Essential (primary) hypertension: Secondary | ICD-10-CM

## 2024-09-16 MED ORDER — METOPROLOL TARTRATE 50 MG PO TABS
50.0000 mg | ORAL_TABLET | Freq: Two times a day (BID) | ORAL | 3 refills | Status: AC
  Filled 2024-09-16: qty 180, 90d supply, fill #0
  Filled 2024-12-24: qty 180, 90d supply, fill #1

## 2024-10-01 ENCOUNTER — Other Ambulatory Visit (HOSPITAL_COMMUNITY): Payer: Self-pay

## 2024-11-05 ENCOUNTER — Other Ambulatory Visit (HOSPITAL_COMMUNITY): Payer: Self-pay

## 2024-11-18 ENCOUNTER — Other Ambulatory Visit (HOSPITAL_COMMUNITY): Payer: Self-pay

## 2024-12-02 ENCOUNTER — Other Ambulatory Visit (HOSPITAL_COMMUNITY): Payer: Self-pay

## 2024-12-10 ENCOUNTER — Emergency Department (HOSPITAL_COMMUNITY)
Admission: EM | Admit: 2024-12-10 | Discharge: 2024-12-10 | Disposition: A | Discharge status: Home / Self Care | Attending: Emergency Medicine | Admitting: Emergency Medicine

## 2024-12-10 ENCOUNTER — Encounter (HOSPITAL_COMMUNITY): Payer: Self-pay

## 2024-12-10 ENCOUNTER — Emergency Department (HOSPITAL_COMMUNITY)

## 2024-12-10 ENCOUNTER — Other Ambulatory Visit: Payer: Self-pay

## 2024-12-10 DIAGNOSIS — E860 Dehydration: Secondary | ICD-10-CM | POA: Insufficient documentation

## 2024-12-10 DIAGNOSIS — J101 Influenza due to other identified influenza virus with other respiratory manifestations: Secondary | ICD-10-CM | POA: Insufficient documentation

## 2024-12-10 DIAGNOSIS — N179 Acute kidney failure, unspecified: Secondary | ICD-10-CM | POA: Insufficient documentation

## 2024-12-10 DIAGNOSIS — R197 Diarrhea, unspecified: Secondary | ICD-10-CM

## 2024-12-10 LAB — CBC
HCT: 42.5 % (ref 39.0–52.0)
Hemoglobin: 13.6 g/dL (ref 13.0–17.0)
MCH: 29.1 pg (ref 26.0–34.0)
MCHC: 32 g/dL (ref 30.0–36.0)
MCV: 91 fL (ref 80.0–100.0)
Platelets: 161 K/uL (ref 150–400)
RBC: 4.67 MIL/uL (ref 4.22–5.81)
RDW: 14.9 % (ref 11.5–15.5)
WBC: 4.7 K/uL (ref 4.0–10.5)
nRBC: 0 % (ref 0.0–0.2)

## 2024-12-10 LAB — COMPREHENSIVE METABOLIC PANEL WITH GFR
ALT: 18 U/L (ref 0–44)
AST: 40 U/L (ref 15–41)
Albumin: 4.2 g/dL (ref 3.5–5.0)
Alkaline Phosphatase: 59 U/L (ref 38–126)
Anion gap: 15 (ref 5–15)
BUN: 30 mg/dL — ABNORMAL HIGH (ref 8–23)
CO2: 21 mmol/L — ABNORMAL LOW (ref 22–32)
Calcium: 11 mg/dL — ABNORMAL HIGH (ref 8.9–10.3)
Chloride: 105 mmol/L (ref 98–111)
Creatinine, Ser: 1.8 mg/dL — ABNORMAL HIGH (ref 0.61–1.24)
GFR, Estimated: 36 mL/min — ABNORMAL LOW
Glucose, Bld: 96 mg/dL (ref 70–99)
Potassium: 4.5 mmol/L (ref 3.5–5.1)
Sodium: 141 mmol/L (ref 135–145)
Total Bilirubin: 0.5 mg/dL (ref 0.0–1.2)
Total Protein: 7.7 g/dL (ref 6.5–8.1)

## 2024-12-10 LAB — URINALYSIS, ROUTINE W REFLEX MICROSCOPIC
Bacteria, UA: NONE SEEN
Bilirubin Urine: NEGATIVE
Glucose, UA: NEGATIVE mg/dL
Hgb urine dipstick: NEGATIVE
Ketones, ur: 5 mg/dL — AB
Leukocytes,Ua: NEGATIVE
Nitrite: NEGATIVE
Protein, ur: 30 mg/dL — AB
Specific Gravity, Urine: 1.023 (ref 1.005–1.030)
pH: 5 (ref 5.0–8.0)

## 2024-12-10 LAB — RESP PANEL BY RT-PCR (RSV, FLU A&B, COVID)  RVPGX2
Influenza A by PCR: POSITIVE — AB
Influenza B by PCR: NEGATIVE
Resp Syncytial Virus by PCR: NEGATIVE
SARS Coronavirus 2 by RT PCR: NEGATIVE

## 2024-12-10 LAB — LIPASE, BLOOD: Lipase: 18 U/L (ref 11–51)

## 2024-12-10 MED ORDER — LACTATED RINGERS IV BOLUS
1000.0000 mL | Freq: Once | INTRAVENOUS | Status: AC
  Administered 2024-12-10: 1000 mL via INTRAVENOUS

## 2024-12-10 MED ORDER — ONDANSETRON HCL 4 MG/2ML IJ SOLN
4.0000 mg | Freq: Once | INTRAMUSCULAR | Status: AC
  Administered 2024-12-10: 4 mg via INTRAVENOUS
  Filled 2024-12-10: qty 2

## 2024-12-10 MED ORDER — SODIUM CHLORIDE 0.9 % IV BOLUS
1000.0000 mL | Freq: Once | INTRAVENOUS | Status: AC
  Administered 2024-12-10: 1000 mL via INTRAVENOUS

## 2024-12-10 NOTE — ED Provider Notes (Addendum)
 " [place] EMERGENCY DEPARTMENT AT [facility] Provider Note   CSN: [id number] Arrival date & time: 12/10/24  1014     Patient presents with: Diarrhea   Joe LITTIE Minerva Sr. is a 89 y.o. male.   Pt c/o diarrhea in past 3-4 days, several episodes per day, watery. No bloody bms, no melena. No abd pain or distension. Prior to symptoms ?possible bad food ingestion. No specific known ill contacts. Nausea/decreased appetite/poor po intake for past few days. +non prod cough and congestion in past few days. No sore throat or trouble swallowing. No chest pain or discomfort. No sob or doe. No syncope but feels generally weak. No focal or unilateral numbness/weakness.   The history is provided by the patient, a relative and medical records.  Diarrhea Associated symptoms: no abdominal pain, no fever, no headaches and no vomiting        Prior to Admission medications  Medication Sig Start Date End Date Taking? Authorizing Provider  acetaminophen  (TYLENOL ) 325 MG tablet Take 650 mg by mouth every 6 (six) hours as needed for moderate pain or headache.    [provider]  azithromycin  (ZITHROMAX  Z-PAK) 250 MG tablet Take 2 tablets by mouth on day 1, then 1 tablet by mouth everyday days 2-5. 09/05/23     diltiazem  (CARDIZEM  CD) 120 MG 24 hr capsule Take 1 capsule (120 mg total) by mouth daily. 08/20/24   Hilty, Vinie BROCKS, MD  diltiazem  (CARTIA  XT) 120 MG 24 hr capsule Take 1 capsule (120 mg total) by mouth daily. 09/05/23     docusate sodium (COLACE) 100 MG capsule Take 100 mg by mouth daily as needed for mild constipation.    [provider]  GARLIC PO Take 1,000 mg by mouth daily.    [provider]  metoprolol  tartrate (LOPRESSOR ) 50 MG tablet Take 1 tablet (50 mg total) by mouth 2 (two) times daily. 09/16/24   Hilty, Vinie BROCKS, MD  Multiple Vitamin (MULTIVITAMIN WITH MINERALS) TABS Take 1 tablet by mouth daily.    [provider]  Naphazoline HCl (CLEAR  EYES OP) Place 2 drops into the left eye daily as needed (dry eyes).    [provider]  Probiotic Product (PROBIOTIC DAILY) CAPS Take 1 capsule by mouth daily. 09/07/15   Hilty, Vinie BROCKS, MD  rivaroxaban  (XARELTO ) 20 MG TABS tablet Take 1 tablet (20 mg total) by mouth daily. 08/06/24   Hilty, Vinie BROCKS, MD  Saw Palmetto 450 MG CAPS Take 1 capsule by mouth daily.    [provider]  simvastatin  (ZOCOR ) 20 MG tablet Take 1 tablet (20 mg total) by mouth nightly at bedtime. 08/06/24   Hilty, Vinie BROCKS, MD  tamsulosin  (FLOMAX ) 0.4 MG CAPS capsule Take 1 capsule (0.4 mg total) by mouth daily. 09/02/24   Hilty, Vinie BROCKS, MD    Allergies: Penicillins and Shellfish allergy    Review of Systems  Constitutional:  Negative for fever.  HENT:  Positive for congestion and rhinorrhea. Negative for sore throat.   Eyes:  Negative for redness.  Respiratory:  Positive for cough. Negative for shortness of breath.   Cardiovascular:  Negative for chest pain and leg swelling.  Gastrointestinal:  Positive for diarrhea. Negative for abdominal pain and vomiting.  Genitourinary:  Negative for dysuria and flank pain.  Musculoskeletal:  Negative for back pain and neck pain.  Skin:  Negative for rash.  Neurological:  Negative for headaches.    Updated Vital Signs BP 111/69  Pulse 63   Temp 97.8 F (36.6 C) (Oral)   Resp 17   Ht 1.778 m (5' 10)   Wt 93 kg   SpO2 94%   BMI 29.41 kg/m   Physical Exam Vitals and nursing note reviewed.  Constitutional:      Appearance: Normal appearance. He is well-developed.  HENT:     Head: Atraumatic.     Nose: Congestion present.     Mouth/Throat:     Mouth: Mucous membranes are moist.     Pharynx: Oropharynx is clear. No oropharyngeal exudate or posterior oropharyngeal erythema.  Eyes:     General: No scleral icterus.    Conjunctiva/sclera: Conjunctivae normal.     Pupils: Pupils are equal, round, and reactive to light.  Neck:     Trachea: No  tracheal deviation.     Comments: Trachea midline. No neck stiffness or rigidity.  Cardiovascular:     Rate and Rhythm: Normal rate and regular rhythm.     Pulses: Normal pulses.     Heart sounds: Normal heart sounds. No murmur heard.    No friction rub. No gallop.  Pulmonary:     Effort: Pulmonary effort is normal. No accessory muscle usage or respiratory distress.     Breath sounds: Normal breath sounds.     Comments: Non prod cough/coughing.  Abdominal:     General: Bowel sounds are normal. There is no distension.     Palpations: Abdomen is soft. There is no mass.     Tenderness: There is no abdominal tenderness. There is no guarding.  Genitourinary:    Comments: No cva tenderness. Musculoskeletal:        General: No swelling or tenderness.     Cervical back: Normal range of motion and neck supple. No rigidity.  Lymphadenopathy:     Cervical: No cervical adenopathy.  Skin:    General: Skin is warm and dry.     Findings: No rash.  Neurological:     Mental Status: He is alert.     Comments: Alert, speech clear. Motor/sens grossly intact bil.   Psychiatric:        Mood and Affect: Mood normal.     (all labs ordered are listed, but only abnormal results are displayed) Results for orders placed or performed during the hospital encounter of 12/10/24  Lipase, blood   Collection Time: 12/10/24 10:35 AM  Result Value Ref Range   Lipase 18 11 - 51 U/L  Comprehensive metabolic panel   Collection Time: 12/10/24 10:35 AM  Result Value Ref Range   Sodium 141 135 - 145 mmol/L   Potassium 4.5 3.5 - 5.1 mmol/L   Chloride 105 98 - 111 mmol/L   CO2 21 (L) 22 - 32 mmol/L   Glucose, Bld 96 70 - 99 mg/dL   BUN 30 (H) 8 - 23 mg/dL   Creatinine, Ser 8.19 (H) 0.61 - 1.24 mg/dL   Calcium 88.9 (H) 8.9 - 10.3 mg/dL   Total Protein 7.7 6.5 - 8.1 g/dL   Albumin 4.2 3.5 - 5.0 g/dL   AST 40 15 - 41 U/L   ALT 18 0 - 44 U/L   Alkaline Phosphatase 59 38 - 126 U/L   Total Bilirubin 0.5 0.0 -  1.2 mg/dL   GFR, Estimated 36 (L) >60 mL/min   Anion gap 15 5 - 15  CBC   Collection Time: 12/10/24 10:35 AM  Result Value Ref Range   WBC 4.7 4.0 - 10.5 K/uL  RBC 4.67 4.22 - 5.81 MIL/uL   Hemoglobin 13.6 13.0 - 17.0 g/dL   HCT 57.4 60.9 - 47.9 %   MCV 91.0 80.0 - 100.0 fL   MCH 29.1 26.0 - 34.0 pg   MCHC 32.0 30.0 - 36.0 g/dL   RDW 85.0 88.4 - 84.4 %   Platelets 161 150 - 400 K/uL   nRBC 0.0 0.0 - 0.2 %  Resp panel by RT-PCR (RSV, Flu A&B, Covid) Anterior Nasal Swab   Collection Time: 12/10/24 10:40 AM   Specimen: Anterior Nasal Swab  Result Value Ref Range   SARS Coronavirus 2 by RT PCR NEGATIVE NEGATIVE   Influenza A by PCR POSITIVE (A) NEGATIVE   Influenza B by PCR NEGATIVE NEGATIVE   Resp Syncytial Virus by PCR NEGATIVE NEGATIVE    EKG: None  Radiology: [facility] Chest Port 1 View Result Date: 12/10/2024 EXAM: 1 VIEW XRAY OF THE CHEST 12/10/2024 11:27:00 AM COMPARISON: CT chest, abdomen, and pelvis 12/19/2019 and earlier. CLINICAL HISTORY: 89 year old male with cough. Comparison to CT chest, abdomen, and pelvis dated 12/19/2019 and earlier. FINDINGS: LUNGS AND PLEURA: Moderate chronic elevation of the left hemidiaphragm, present since at least 2005. Associated chronic platelike atelectasis at the left lung base. No pleural effusion. No pneumothorax. HEART AND MEDIASTINUM: No acute abnormality of the cardiac and mediastinal silhouettes. BONES AND SOFT TISSUES: Thoracic degenerative changes. No acute osseous abnormality. UPPER ABDOMEN: Gas filled bowel loops in the visible upper abdomen appear nondilated, within normal limits. Chronic cholecystectomy. IMPRESSION: 1. No acute cardiopulmonary abnormality. 2. Chronically elevated left hemidiaphragm with left lung atelectasis. Electronically signed by: Helayne Hurst MD 12/10/2024 12:12 PM EST RP Workstation: [id number]     Procedures   Medications Ordered in the ED  lactated ringers  bolus 1,000 mL (1,000 mLs Intravenous New Bag/Given  12/10/24 1140)  ondansetron  (ZOFRAN ) injection 4 mg (4 mg Intravenous Given 12/10/24 1140)                                    Medical Decision Making Problems Addressed: Acute diarrhea: acute illness or injury with systemic symptoms that poses a threat to life or bodily functions AKI (acute kidney injury): acute illness or injury with systemic symptoms Dehydration: acute illness or injury with systemic symptoms that poses a threat to life or bodily functions Influenza A: acute illness or injury with systemic symptoms that poses a threat to life or bodily functions  Amount and/or Complexity of Data Reviewed Independent Historian:     Details: Family, hx External Data Reviewed: notes. Labs: ordered. Decision-making details documented in ED Course. Radiology: ordered and independent interpretation performed. Decision-making details documented in ED Course.  Risk Prescription drug management. Decision regarding hospitalization.   Iv ns. Labs ordered/sent. Imaging ordered.   Differential diagnosis includes diarrheal illness, gastroenteritis, colitis, viral syndrome, dehydration, etc. Dispo decision including potential need for admission considered - will get labs and imaging and reassess.   Reviewed nursing notes and prior charts for additional history. External reports reviewed. Additional history from: family.  LR bolus. Zofran  iv.   Labs reviewed/interpreted by me - wbc 4, hgb 13. Mild aki on labs, k normal.  Influenza is positive. Discussed w pt.   NS bolus. Po fluids.   Xrays reviewed/interpreted by me - no pna.   Pt tolerating po, no faintness or dizziness, no recurrent diarrhea. Abd soft non tender. Pt breathing comfortably.   Pt appears stable for  ED d/c.   Rec close pcp f/u, incl recheck labs.   Return precautions provided.       Final diagnoses:  None    ED Discharge Orders     None          Bernard Drivers, MD 12/10/24 1457    Bernard Drivers,  MD 12/10/24 1529  "

## 2024-12-10 NOTE — Discharge Instructions (Addendum)
 It was our pleasure to provide your ER care today - we hope that you feel better.  Your flu test is positive, and your lab work indicates you are dehydrated (kidney tests elevated compared to prior, elevated calcium level).   Drink plenty of fluids/stay well hydrated - see 'rehydration' information.   Follow up with primary care doctor Monday for recheck. Also have your labs/chemistries, including kidney function tests and calcium level rechecked then to make sure your kidney function is improving and returning to your normal.   Return to ER right away if worse, new symptoms, increased trouble breathing, severe diarrhea, persistent vomiting, abdominal pain, weak/fainting, or other concern.

## 2024-12-10 NOTE — ED Triage Notes (Signed)
 Pt c/o diarrhea for the past two days. Pt reports he thought he had bad food a couple of days ago. Pt denies n/v or fever. Pt denies any abdominal pain.

## 2024-12-24 ENCOUNTER — Other Ambulatory Visit (HOSPITAL_COMMUNITY): Payer: Self-pay
